# Patient Record
Sex: Female | Born: 1955 | Race: White | Hispanic: No | Smoking: Never smoker
Health system: Southern US, Community
[De-identification: ages and names within clinical notes are randomized; demographics above are authoritative.]

## PROBLEM LIST (undated history)

## (undated) DIAGNOSIS — Z0000 Encounter for general adult medical examination without abnormal findings: Secondary | ICD-10-CM

## (undated) DIAGNOSIS — Z91018 Allergy to other foods: Secondary | ICD-10-CM

## (undated) DIAGNOSIS — R011 Cardiac murmur, unspecified: Secondary | ICD-10-CM

## (undated) DIAGNOSIS — L309 Dermatitis, unspecified: Secondary | ICD-10-CM

## (undated) DIAGNOSIS — L900 Lichen sclerosus et atrophicus: Secondary | ICD-10-CM

## (undated) DIAGNOSIS — K219 Gastro-esophageal reflux disease without esophagitis: Secondary | ICD-10-CM

## (undated) DIAGNOSIS — T7840XA Allergy, unspecified, initial encounter: Secondary | ICD-10-CM

## (undated) DIAGNOSIS — I2510 Atherosclerotic heart disease of native coronary artery without angina pectoris: Secondary | ICD-10-CM

## (undated) DIAGNOSIS — M1990 Unspecified osteoarthritis, unspecified site: Secondary | ICD-10-CM

## (undated) DIAGNOSIS — H269 Unspecified cataract: Secondary | ICD-10-CM

## (undated) DIAGNOSIS — L509 Urticaria, unspecified: Secondary | ICD-10-CM

## (undated) DIAGNOSIS — J069 Acute upper respiratory infection, unspecified: Secondary | ICD-10-CM

## (undated) DIAGNOSIS — R5383 Other fatigue: Secondary | ICD-10-CM

## (undated) DIAGNOSIS — E785 Hyperlipidemia, unspecified: Secondary | ICD-10-CM

## (undated) HISTORY — DX: Lichen sclerosus et atrophicus: L90.0

## (undated) HISTORY — DX: Unspecified osteoarthritis, unspecified site: M19.90

## (undated) HISTORY — DX: Acute upper respiratory infection, unspecified: J06.9

## (undated) HISTORY — DX: Other fatigue: R53.83

## (undated) HISTORY — PX: PARTIAL HYSTERECTOMY: SHX80

## (undated) HISTORY — DX: Allergy, unspecified, initial encounter: T78.40XA

## (undated) HISTORY — DX: Unspecified cataract: H26.9

## (undated) HISTORY — PX: EYE SURGERY: SHX253

## (undated) HISTORY — DX: Gastro-esophageal reflux disease without esophagitis: K21.9

## (undated) HISTORY — DX: Atherosclerotic heart disease of native coronary artery without angina pectoris: I25.10

## (undated) HISTORY — DX: Encounter for general adult medical examination without abnormal findings: Z00.00

## (undated) HISTORY — PX: SPINE SURGERY: SHX786

## (undated) HISTORY — PX: BREAST SURGERY: SHX581

## (undated) HISTORY — DX: Dermatitis, unspecified: L30.9

## (undated) HISTORY — DX: Hyperlipidemia, unspecified: E78.5

## (undated) HISTORY — PX: ABDOMINAL HYSTERECTOMY: SHX81

## (undated) HISTORY — DX: Cardiac murmur, unspecified: R01.1

## (undated) HISTORY — PX: ANTERIOR CERVICAL DECOMP/DISCECTOMY FUSION: SHX1161

## (undated) HISTORY — DX: Urticaria, unspecified: L50.9

---

## 1983-06-15 HISTORY — PX: TUBAL LIGATION: SHX77

## 2008-10-09 ENCOUNTER — Encounter: Admission: RE | Admit: 2008-10-09 | Discharge: 2008-10-09 | Payer: Self-pay | Admitting: Obstetrics & Gynecology

## 2009-06-28 ENCOUNTER — Emergency Department (HOSPITAL_COMMUNITY)
Admission: EM | Admit: 2009-06-28 | Discharge: 2009-06-28 | Payer: Self-pay | Source: Home / Self Care | Admitting: Emergency Medicine

## 2010-07-14 ENCOUNTER — Other Ambulatory Visit: Payer: Self-pay | Admitting: Family Medicine

## 2010-07-14 ENCOUNTER — Ambulatory Visit
Admission: RE | Admit: 2010-07-14 | Discharge: 2010-07-14 | Payer: Self-pay | Source: Home / Self Care | Attending: Family Medicine | Admitting: Family Medicine

## 2010-07-14 ENCOUNTER — Other Ambulatory Visit (HOSPITAL_COMMUNITY)
Admission: RE | Admit: 2010-07-14 | Discharge: 2010-07-14 | Disposition: A | Discharge status: Home / Self Care | Payer: 59 | Source: Ambulatory Visit | Attending: Family Medicine | Admitting: Family Medicine

## 2010-07-14 DIAGNOSIS — R5383 Other fatigue: Secondary | ICD-10-CM | POA: Insufficient documentation

## 2010-07-14 DIAGNOSIS — Z124 Encounter for screening for malignant neoplasm of cervix: Secondary | ICD-10-CM | POA: Insufficient documentation

## 2010-07-14 DIAGNOSIS — R5381 Other malaise: Secondary | ICD-10-CM | POA: Insufficient documentation

## 2010-07-14 DIAGNOSIS — Z1239 Encounter for other screening for malignant neoplasm of breast: Secondary | ICD-10-CM

## 2010-07-14 DIAGNOSIS — Z1159 Encounter for screening for other viral diseases: Secondary | ICD-10-CM | POA: Insufficient documentation

## 2010-07-14 LAB — LDL CHOLESTEROL, DIRECT: Direct LDL: 204.3 mg/dL

## 2010-07-14 LAB — CBC WITH DIFFERENTIAL/PLATELET
Basophils Absolute: 0 10*3/uL (ref 0.0–0.1)
Eosinophils Absolute: 0.2 10*3/uL (ref 0.0–0.7)
HCT: 40.4 % (ref 36.0–46.0)
Hemoglobin: 13.8 g/dL (ref 12.0–15.0)
MCHC: 34.3 g/dL (ref 30.0–36.0)
MCV: 86.6 fl (ref 78.0–100.0)
Monocytes Absolute: 0.4 10*3/uL (ref 0.1–1.0)
Neutrophils Relative %: 59.9 % (ref 43.0–77.0)
Platelets: 255 10*3/uL (ref 150.0–400.0)
RBC: 4.66 Mil/uL (ref 3.87–5.11)

## 2010-07-14 LAB — LIPID PANEL
HDL: 75.4 mg/dL (ref >39.00)
Total CHOL/HDL Ratio: 4

## 2010-07-14 LAB — HM PAP SMEAR

## 2010-07-14 LAB — TSH: TSH: 0.66 u[IU]/mL (ref 0.35–5.50)

## 2010-07-17 ENCOUNTER — Encounter: Payer: Self-pay | Admitting: Family Medicine

## 2010-07-22 NOTE — Assessment & Plan Note (Signed)
Summary: NEW PT TO EST/CPX/CLE   Vital Signs:  Patient profile:   55 year old female Height:      63 inches Weight:      148.50 pounds BMI:     26.40 Temp:     97.8 degrees F oral Pulse rate:   72 / minute Pulse rhythm:   regular BP sitting:   120 / 80  (right arm) Cuff size:   regular  Vitals Entered By: Linde Gillis CMA Duncan Dull) (July 14, 2010 10:43 AM) CC: new patient, establish care   History of Present Illness: 55 yo here to establish care and for CPX.  menopausal x 2 years now. Had a partial hysterectomy in 80s, she is not sure if she has a cervix. Had hot flashes last year and vaginal dryness but symptoms are improving.  Had a URI several weeks ago, most symptoms resolved but still tired.  Has a little DOE, no CP. No LE edema. No hot or cold intolerance.  Preventive Screening-Counseling & Management  Alcohol-Tobacco     Smoking Status: never  Caffeine-Diet-Exercise     Does Patient Exercise: yes      Drug Use:  no.    Current Medications (verified): 1)  None  Allergies (verified): No Known Drug Allergies  Past History:  Family History: Last updated: 07/14/2010 Family History High cholesterol Family History Hypertension  Social History: Last updated: 07/14/2010 Divorced Never Smoked Alcohol use-no Drug use-no Regular exercise-yes  Risk Factors: Exercise: yes (07/14/2010)  Risk Factors: Smoking Status: never (07/14/2010)  Past Medical History: G3P2  Past Surgical History: Hysterectomy- per pt, partial Tubal ligation  Family History: Family History High cholesterol Family History Hypertension  Social History: Divorced Never Smoked Alcohol use-no Drug use-no Regular exercise-yes Smoking Status:  never Drug Use:  no Does Patient Exercise:  yes  Review of Systems      See HPI General:  Complains of fatigue; denies fever, loss of appetite, and sleep disorder. Eyes:  Denies blurring. ENT:  Denies difficulty swallowing. CV:   Denies chest pain or discomfort. Resp:  Denies shortness of breath. GI:  Denies abdominal pain, bloody stools, and change in bowel habits. GU:  Denies discharge and dysuria. MS:  Denies joint pain, joint redness, and joint swelling. Derm:  Denies rash. Neuro:  Denies headaches. Psych:  Denies anxiety and depression. Endo:  Denies cold intolerance and heat intolerance. Heme:  Denies abnormal bruising and bleeding.  Physical Exam  General:  alert, well-developed, well-nourished, and well-hydrated.   Head:  normocephalic and atraumatic.   Eyes:  vision grossly intact, pupils equal, pupils round, and pupils reactive to light.   Ears:  R ear normal and L ear normal.   Nose:  no external deformity.   Mouth:  good dentition.   Neck:  No deformities, masses, or tenderness noted. Breasts:  No mass, nodules, thickening, tenderness, bulging, retraction, inflamation, nipple discharge or skin changes noted.   Lungs:  Normal respiratory effort, chest expands symmetrically. Lungs are clear to auscultation, no crackles or wheezes. Heart:  Normal rate and regular rhythm. S1 and S2 normal without gallop, murmur, click, rub or other extra sounds. Abdomen:  Bowel sounds positive,abdomen soft and non-tender without masses, organomegaly or hernias noted. Rectal:  no external abnormalities.   Genitalia:  Pelvic Exam:        External: normal female genitalia without lesions or masses        Vagina: normal without lesions or masses  Cervix: absent        Adnexa: normal bimanual exam without masses or fullness        Uterus: absent        Pap smear: performed Msk:  No deformity or scoliosis noted of thoracic or lumbar spine.   Extremities:  no edema Neurologic:  alert & oriented X3 and gait normal.   Skin:  Intact without suspicious lesions or rashes Psych:  Cognition and judgment appear intact. Alert and cooperative with normal attention span and concentration. No apparent delusions, illusions,  hallucinations   Impression & Recommendations:  Problem # 1:  Preventive Health Care (ICD-V70.0) Reviewed preventive care protocols, scheduled due services, and updated immunizations Discussed nutrition, exercise, diet, and healthy lifestyle.  FLP, BMET today. referred for colonoscopy and mammogram today. Pelvic/pap performed- discussed that since she s/p hysterectomy, from this point forward will do pelvic exams but not pap smears.  Pt in agreement with plan.  Problem # 2:  FATIGUE (ICD-780.79) Assessment: New Likely due to URI from several weeks ago.  Lungs clear on exam. Will check some lab work to rule out other possible contributing factors. Orders: TLB-BMP (Basic Metabolic Panel-BMET) (80048-METABOL) TLB-CBC Platelet - w/Differential (85025-CBCD) TLB-TSH (Thyroid Stimulating Hormone) ([address]-UEA)  Other Orders: Gastroenterology Referral (GI) Radiology Referral (Radiology) TLB-Lipid Panel (80061-LIPID)  Patient Instructions: 1)  Great to meet you. 2)  Please stop by to see Shirlee Limerick on your way out.   Orders Added: 1)  Gastroenterology Referral [GI] 2)  Radiology Referral [Radiology] 3)  TLB-Lipid Panel [80061-LIPID] 4)  TLB-BMP (Basic Metabolic Panel-BMET) [80048-METABOL] 5)  TLB-CBC Platelet - w/Differential [85025-CBCD] 6)  TLB-TSH (Thyroid Stimulating Hormone) [84443-TSH] 7)  New Patient 40-64 years [99386] 8)  New Patient Level II [99202]    Prior Medications (reviewed today): None Current Allergies (reviewed today): No known allergies  Prevention & Chronic Care Immunizations   Influenza vaccine: Not documented    Tetanus booster: Not documented    Pneumococcal vaccine: Not documented  Colorectal Screening   Hemoccult: Not documented    Colonoscopy: Not documented   Colonoscopy action/deferral: GI referral  (07/14/2010)  Other Screening   Pap smear: Not documented   Pap smear action/deferral: Ordered  (07/14/2010)    Mammogram: Not documented    Mammogram action/deferral: Ordered  (07/14/2010)   Smoking status: never  (07/14/2010)  Lipids   Total Cholesterol: Not documented   Lipid panel action/deferral: Lipid Panel ordered   LDL: Not documented   LDL Direct: Not documented   HDL: Not documented   Triglycerides: Not documented   Nursing Instructions: GI referral for screening colonoscopy (see order) Pap smear today Schedule screening mammogram (see order)

## 2010-07-22 NOTE — Letter (Signed)
Summary: Results Follow up Letter  [place] [facility]  [address] [place], [place] [address]   Phone: [phone]  Fax: [phone]    07/17/2010 MRN: [id number]  Nancy [name] [address] [place], [place]  [address]  Dear [name],  The following are the results of your recent test(s):  Test         Result    Pap Smear:        Normal __X___  Not Normal _____ Comments: ______________________________________________________ Cholesterol: LDL(Bad cholesterol):         Your goal is less than:         HDL (Good cholesterol):       Your goal is more than: Comments:  ______________________________________________________ Mammogram:        Normal _____  Not Normal _____ Comments:  ___________________________________________________________________ Hemoccult:        Normal _____  Not normal _______ Comments:    _____________________________________________________________________ Other Tests:    We routinely do not discuss normal results over the telephone.  If you desire a copy of the results, or you have any questions about this information we can discuss them at your next office visit.   Sincerely,      Dr. Ruthe Mannan

## 2010-07-23 ENCOUNTER — Encounter (INDEPENDENT_AMBULATORY_CARE_PROVIDER_SITE_OTHER): Payer: Self-pay | Admitting: *Deleted

## 2010-07-24 ENCOUNTER — Encounter: Payer: Self-pay | Admitting: Gastroenterology

## 2010-07-28 ENCOUNTER — Ambulatory Visit
Admission: RE | Admit: 2010-07-28 | Discharge: 2010-07-28 | Disposition: A | Discharge status: Home / Self Care | Payer: 59 | Source: Ambulatory Visit | Attending: Family Medicine | Admitting: Family Medicine

## 2010-07-28 DIAGNOSIS — Z1239 Encounter for other screening for malignant neoplasm of breast: Secondary | ICD-10-CM

## 2010-07-30 NOTE — Miscellaneous (Signed)
Summary: previsit prep/rm  Clinical Lists Changes  Medications: Added new medication of MOVIPREP 100 GM  SOLR (PEG-KCL-NACL-NASULF-NA ASC-C) As per prep instructions. - Signed Rx of MOVIPREP 100 GM  SOLR (PEG-KCL-NACL-NASULF-NA ASC-C) As per prep instructions.;  #1 x 0;  Signed;  Entered by: Sherren Kerns RN;  Authorized by: Meryl Dare MD [facility];  Method used: [facility]  [address] [place], [place]  [address], Ph: [phone] or [phone], Fax: [phone] Observations: Added new observation of ALLERGY REV: Done (07/24/2010 10:24)    Prescriptions: MOVIPREP 100 GM  SOLR (PEG-KCL-NACL-NASULF-NA ASC-C) As per prep instructions.  #1 x 0   Entered by:   Sherren Kerns RN   Authorized by:   Meryl Dare MD [facility]   Signed by:   Sherren Kerns RN on 07/24/2010   Method used:   [facility]        [facility]  [address] (retail)       [address]       [place], [place]  [address]       Ph: [phone] or [phone]       Fax: [phone]   RxID:   [id number]

## 2010-07-30 NOTE — Letter (Signed)
Summary: [facility] Instructions  [facility]  [address] [place], [place] [address]   Phone: [phone]  Fax: [phone]       Nancy [name]    1956-04-22    MRN: [id number]        Procedure Day Dorna Bloom: Wednesday 08/19/10     Arrival Time: 8:00 am      Procedure Time: 9:00 am     Location of Procedure:                    _X_   [facility] (4th Floor)  PREPARATION FOR COLONOSCOPY WITH MOVIPREP   Starting 5 days prior to your procedure 08/14/10 do not eat nuts, seeds, popcorn, corn, beans, peas,  salads, or any raw vegetables.  Do not take any fiber supplements (e.g. Metamucil, Citrucel, and Benefiber).  THE DAY BEFORE YOUR PROCEDURE         DATE: 08/18/10  DAY: Tuesday    1.  Drink clear liquids the entire day-NO SOLID FOOD  2.  Do not drink anything colored red or purple.  Avoid juices with pulp.  No orange juice.  3.  Drink at least 64 oz. (8 glasses) of fluid/clear liquids during the day to prevent dehydration and help the prep work efficiently.  CLEAR LIQUIDS INCLUDE: Water Jello Ice Popsicles Tea (sugar ok, no milk/cream) Powdered fruit flavored drinks Coffee (sugar ok, no milk/cream) Gatorade Juice: apple, white grape, white cranberry  Lemonade Clear bullion, consomm, broth Carbonated beverages (any kind) Strained chicken noodle soup Hard Candy                             4.  In the morning, mix first dose of MoviPrep solution:    Empty 1 Pouch A and 1 Pouch B into the disposable container    Add lukewarm drinking water to the top line of the container. Mix to dissolve    Refrigerate (mixed solution should be used within 24 hrs)  5.  Begin drinking the prep at 5:00 p.m. The MoviPrep container is divided by 4 marks.   Every 15 minutes drink the solution down to the next mark (approximately 8 oz) until the full liter is complete.   6.  Follow completed prep with 16 oz of clear liquid of your choice (Nothing red or purple).   Continue to drink clear liquids until bedtime.  7.  Before going to bed, mix second dose of MoviPrep solution:    Empty 1 Pouch A and 1 Pouch B into the disposable container    Add lukewarm drinking water to the top line of the container. Mix to dissolve    Refrigerate  THE DAY OF YOUR PROCEDURE      DATE: 08/18/10   DAY: Tuesday   Beginning at 4:00 a.m. (5 hours before procedure):         1. Every 15 minutes, drink the solution down to the next mark (approx 8 oz) until the full liter is complete.  2. Follow completed prep with 16 oz. of clear liquid of your choice.    3. You may drink clear liquids until 7:00 am (2 HOURS BEFORE PROCEDURE).   MEDICATION INSTRUCTIONS  Unless otherwise instructed, you should take regular prescription medications with a small sip of water   as early as possible the morning of your procedure.         OTHER INSTRUCTIONS  You will need a  responsible adult at least 56 years of age to accompany you and drive you home.   This person must remain in the waiting room during your procedure.  Wear loose fitting clothing that is easily removed.  Leave jewelry and other valuables at home.  However, you may wish to bring a book to read or  an iPod/MP3 player to listen to music as you wait for your procedure to start.  Remove all body piercing jewelry and leave at home.  Total time from sign-in until discharge is approximately 2-3 hours.  You should go home directly after your procedure and rest.  You can resume normal activities the  day after your procedure.  The day of your procedure you should not:   Drive   Make legal decisions   Operate machinery   Drink alcohol   Return to work  You will receive specific instructions about eating, activities and medications before you leave.    The above instructions have been reviewed and explained to me by  Sherren Kerns RN  July 24, 2010 10:56 AM    I fully understand and can verbalize  these instructions _____________________________ Date _________

## 2010-07-31 ENCOUNTER — Encounter: Payer: Self-pay | Admitting: Family Medicine

## 2010-08-05 NOTE — Letter (Signed)
Summary: Results Follow up Letter  [place] [facility]  [address] [place], [place] [address]   Phone: [phone]  Fax: [phone]    07/31/2010 MRN: [id number]  Nancy [name] [address] [facility] [place], [place]  [address]  [place]  Dear Ms. Rickenbach,  The following are the results of your recent test(s):  Test         Result    Pap Smear:        Normal _____  Not Normal _____ Comments: ______________________________________________________ Cholesterol: LDL(Bad cholesterol):         Your goal is less than:         HDL (Good cholesterol):       Your goal is more than: Comments:  ______________________________________________________ Mammogram:        Normal __X___  Not Normal _____ Comments:  ___________________________________________________________________ Hemoccult:        Normal _____  Not normal _______ Comments:    _____________________________________________________________________ Other Tests:    We routinely do not discuss normal results over the telephone.  If you desire a copy of the results, or you have any questions about this information we can discuss them at your next office visit.   Sincerely,      Dr. Ruthe Mannan

## 2010-08-07 ENCOUNTER — Other Ambulatory Visit: Payer: Self-pay | Admitting: Gastroenterology

## 2010-08-19 ENCOUNTER — Other Ambulatory Visit (AMBULATORY_SURGERY_CENTER): Payer: 59 | Admitting: Gastroenterology

## 2010-08-19 ENCOUNTER — Other Ambulatory Visit: Payer: Self-pay | Admitting: Gastroenterology

## 2010-08-19 DIAGNOSIS — D126 Benign neoplasm of colon, unspecified: Secondary | ICD-10-CM

## 2010-08-19 DIAGNOSIS — D128 Benign neoplasm of rectum: Secondary | ICD-10-CM

## 2010-08-19 DIAGNOSIS — Z1211 Encounter for screening for malignant neoplasm of colon: Secondary | ICD-10-CM

## 2010-08-19 DIAGNOSIS — D129 Benign neoplasm of anus and anal canal: Secondary | ICD-10-CM

## 2010-08-19 DIAGNOSIS — K5730 Diverticulosis of large intestine without perforation or abscess without bleeding: Secondary | ICD-10-CM

## 2010-08-25 ENCOUNTER — Other Ambulatory Visit: Payer: Self-pay | Admitting: Family Medicine

## 2010-08-25 ENCOUNTER — Encounter (INDEPENDENT_AMBULATORY_CARE_PROVIDER_SITE_OTHER): Payer: Self-pay | Admitting: *Deleted

## 2010-08-25 ENCOUNTER — Other Ambulatory Visit (INDEPENDENT_AMBULATORY_CARE_PROVIDER_SITE_OTHER): Payer: 59

## 2010-08-25 DIAGNOSIS — E785 Hyperlipidemia, unspecified: Secondary | ICD-10-CM

## 2010-08-25 LAB — LIPID PANEL
Cholesterol: 194 mg/dL (ref 0–200)
Total CHOL/HDL Ratio: 3
Triglycerides: 69 mg/dL (ref 0.0–149.0)

## 2010-08-25 LAB — HEPATIC FUNCTION PANEL
ALT: 13 U/L (ref 0–35)
Alkaline Phosphatase: 78 U/L (ref 39–117)
Bilirubin, Direct: 0.1 mg/dL (ref 0.0–0.3)
Total Bilirubin: 0.8 mg/dL (ref 0.3–1.2)

## 2010-08-25 NOTE — Procedures (Addendum)
Summary: Colonoscopy  Patient: [name] Note: All result statuses are Final unless otherwise noted.  Tests: (1) Colonoscopy (COL)   COL Colonoscopy           DONE     [facility]     [address]     [place], [place]  [address]          COLONOSCOPY PROCEDURE REPORT          PATIENT:  Nancy [name], Nancy [name]  MR#:  [id number]     BIRTHDATE:  1956-06-13, 54 yrs. old  GENDER:  female     ENDOSCOPIST:  Judie Petit T. Russella Dar, MD, [facility]     Referred by:  Ruthe Mannan, M.D.     PROCEDURE DATE:  08/19/2010     PROCEDURE:  Colonoscopy with biopsy     ASA CLASS:  Class II     INDICATIONS:  1) Routine Risk Screening     MEDICATIONS:   Fentanyl 75 mcg IV, Versed 7 mg IV     DESCRIPTION OF PROCEDURE:   After the risks benefits and     alternatives of the procedure were thoroughly explained, informed     consent was obtained.  Digital rectal exam was performed and     revealed no abnormalities.  The LB [id number]-[id number] endoscope     was introduced through the anus and advanced to the cecum, which     was identified by both the appendix and ileocecal valve, without     limitations. The quality of the prep was good, using MoviPrep.     The instrument was then slowly withdrawn as the colon was fully     examined.     <<PROCEDUREIMAGES>>     FINDINGS:  Mild diverticulosis was found in the sigmoid colon.     Scattered diverticula were found in the transverse colon.  A     sessile polyp was found in the mid transverse colon. It was 3 mm     in size. The polyp was removed using cold biopsy forceps.     Otherwise normal colonoscopy without other polyps, masses, vascular     ectasias, or inflammatory changes.  Retroflexed views in the     rectum revealed no abnormalities.  The time to cecum =  2     minutes. The scope was then withdrawn (time =  10.33  min) from the     patient and the procedure completed.          COMPLICATIONS:  None          ENDOSCOPIC IMPRESSION:     1) Mild  diverticulosis in the sigmoid colon     2) Diverticula, scattered in the transverse colon     3) 3 mm sessile polyp in the mid transverse colon          RECOMMENDATIONS:     1) Await pathology results     2) High fiber diet with liberal fluid intake.     3) If the polyp is adenomatous (pre-cancerous), colonoscopy in 5     years. Otherwise follow colorectal cancer screening guidelines for     "routine risk" patients with colonoscopy in 10 years.          Venita Lick. Russella Dar, MD, [facility]          n.     eSIGNED:   Venita Lick. Miryah Ralls at 08/19/2010 09:13 AM          [place], [id number]  Note: An exclamation mark (!) indicates a result that was not dispersed into the flowsheet. Document Creation Date: 08/19/2010 9:14 AM _______________________________________________________________________  (1) Order result status: Final Collection or observation date-time: 08/19/2010 09:08 Requested date-time:  Receipt date-time:  Reported date-time:  Referring Physician:   Ordering Physician: Claudette Head [id number]) Specimen Source:  Source: Launa Grill Order Number: [id number] Lab site:   Appended Document: Colonoscopy     Procedures Next Due Date:    Colonoscopy: 08/2020

## 2010-08-30 ENCOUNTER — Encounter: Payer: Self-pay | Admitting: Gastroenterology

## 2010-08-30 LAB — COMPREHENSIVE METABOLIC PANEL
Albumin: 3.9 g/dL (ref 3.5–5.2)
Calcium: 9.2 mg/dL (ref 8.4–10.5)
Chloride: 104 mEq/L (ref 96–112)
GFR calc non Af Amer: >60 mL/min (ref >60)
Potassium: 3.4 mEq/L — ABNORMAL LOW (ref 3.5–5.1)
Sodium: 140 mEq/L (ref 135–145)
Total Protein: 6.5 g/dL (ref 6.0–8.3)

## 2010-08-30 LAB — CK TOTAL AND CKMB (NOT AT ARMC): Relative Index: INVALID (ref 0.0–2.5)

## 2010-08-30 LAB — CBC
MCHC: 33.7 g/dL (ref 30.0–36.0)
RBC: 4.68 MIL/uL (ref 3.87–5.11)
RDW: 13.2 % (ref 11.5–15.5)

## 2010-08-30 LAB — DIFFERENTIAL
Basophils Absolute: 0 10*3/uL (ref 0.0–0.1)
Eosinophils Relative: 3 % (ref 0–5)
Lymphocytes Relative: 34 % (ref 12–46)
Monocytes Relative: 7 % (ref 3–12)
Neutro Abs: 3.8 10*3/uL (ref 1.7–7.7)
Neutrophils Relative %: 55 % (ref 43–77)

## 2010-09-10 NOTE — Letter (Signed)
Summary: Patient Notice- Colon Biospy Results  [facility]  [address] [place], [place] [address]   Phone: [phone]  Fax: [phone]        August 30, 2010 MRN: [id number]    Nancy [name] [address] [facility] [place], [place]  [address]    Dear Ms. Kavanaugh,  I am pleased to inform you that the biopsies taken during your recent colonoscopy did not show any evidence of cancer upon pathologic examination. The biopsy showed normal colonic mucosa. There was no polyp.  Continue with the treatment plan as outlined on the day of your      exam.  You should have a repeat colonoscopy examination for routine colorectal cancer screening in 10 years.  Please call us if you are having persistent problems or have questions about your condition that have not been fully answered at this time.  Sincerely,  Meryl Dare MD [facility]  This letter has been electronically signed by your physician.  Appended Document: Patient Notice- Colon Biospy Results letter mailed

## 2010-09-30 ENCOUNTER — Telehealth: Payer: Self-pay | Admitting: *Deleted

## 2010-09-30 NOTE — Telephone Encounter (Signed)
Pt called this morning, states she had symptoms of a sinus infection and wanted to be seen.  I advised her there were no appts available today, offered one for tomorrow, but she said she would go to an urgent care, as she wanted to go back to work tomorrow.  Just an FYI.

## 2010-10-02 ENCOUNTER — Other Ambulatory Visit: Payer: Self-pay | Admitting: Family Medicine

## 2010-10-07 ENCOUNTER — Other Ambulatory Visit: Payer: Self-pay | Admitting: *Deleted

## 2010-10-07 MED ORDER — SIMVASTATIN 20 MG PO TABS: 20.0000 mg | ORAL_TABLET | Freq: Every evening | ORAL | Status: DC

## 2011-10-23 ENCOUNTER — Other Ambulatory Visit: Payer: Self-pay | Admitting: Family Medicine

## 2011-11-02 ENCOUNTER — Ambulatory Visit (INDEPENDENT_AMBULATORY_CARE_PROVIDER_SITE_OTHER): Payer: 59 | Admitting: Family Medicine

## 2011-11-02 ENCOUNTER — Encounter: Payer: Self-pay | Admitting: Family Medicine

## 2011-11-02 VITALS — BP 110/80 | HR 68 | Temp 97.9°F | Wt 146.0 lb

## 2011-11-02 DIAGNOSIS — E785 Hyperlipidemia, unspecified: Secondary | ICD-10-CM

## 2011-11-02 LAB — COMPREHENSIVE METABOLIC PANEL
ALT: 14 U/L (ref 0–35)
Alkaline Phosphatase: 76 U/L (ref 39–117)
BUN: 11 mg/dL (ref 6–23)
Calcium: 9.2 mg/dL (ref 8.4–10.5)
Creatinine, Ser: 0.7 mg/dL (ref 0.4–1.2)
Glucose, Bld: 93 mg/dL (ref 70–99)
Potassium: 5.2 mEq/L — ABNORMAL HIGH (ref 3.5–5.1)

## 2011-11-02 LAB — LDL CHOLESTEROL, DIRECT: Direct LDL: 197.5 mg/dL

## 2011-11-02 LAB — LIPID PANEL
Total CHOL/HDL Ratio: 5
VLDL: 16.6 mg/dL (ref 0.0–40.0)

## 2011-11-02 NOTE — Progress Notes (Signed)
  Subjective:    Patient ID: Nancy [name], female    DOB: 03/17/1956, 56 y.o.   MRN: [id number]  HPI  56 yo here for follow up cholesterol. cholesterol.  Lab Results  Component Value Date   CHOL 194 08/25/2010   HDL 66.20 08/25/2010   LDLCALC 114* 08/25/2010   LDLDIRECT 204.3 07/14/2010   TRIG 69.0 08/25/2010   CHOLHDL 3 08/25/2010   Took simvastatin 20 mg for over a year.  Noticed it was causing upset stomach- gasy, constipation.  Stopped taking it and those symptoms went away. Has been working on diet- cutting back on saturated fats, sweets, fried foods.  Patient Active Problem List  Diagnoses  . FATIGUE  . HYPERLIPIDEMIA   Past Medical History  Diagnosis Date  . Fatigue   . Hyperlipidemia   . Routine general medical examination at a health care facility    Past Surgical History  Procedure Date  . Partial hysterectomy   . Tubal ligation    History  Substance Use Topics  . Smoking status: Never Smoker   . Smokeless tobacco: Not on file  . Alcohol Use: No   No family history on file. No Known Allergies No current outpatient prescriptions on file prior to visit.   The PMH, PSH, Social History, Family History, Medications, and allergies have been reviewed in [facility], and have been updated if relevant.   Review of Systems See HPI    Objective:   Physical Exam BP 110/80  Pulse 68  Temp(Src) 97.9 F (36.6 C) (Oral)  Wt 146 lb (66.225 kg)  General:  Well-developed,well-nourished,in no acute distress; alert,appropriate and cooperative throughout examination Head:  normocephalic and atraumatic.   Eyes:  vision grossly intact, pupils equal, pupils round, and pupils reactive to light.   Ears:  R ear normal and L ear normal.   Nose:  no external deformity.   Mouth:  good dentition.   Lungs:  Normal respiratory effort, chest expands symmetrically. Lungs are clear to auscultation, no crackles or wheezes. Heart:  Normal rate and regular rhythm. S1 and S2 normal without gallop, murmur,  click, rub or other extra sounds. Abdomen:  Bowel sounds positive,abdomen soft and non-tender without masses, organomegaly or hernias noted. Neurologic:  alert & oriented X3 and gait normal.   Skin:  Intact without suspicious lesions or rashes Psych:  Cognition and judgment appear intact. Alert and cooperative with normal attention span and concentration. No apparent delusions, illusions, hallucinations       Assessment & Plan:   1. HYPERLIPIDEMIA  Comprehensive metabolic panel, Lipid Panel   Recheck lipid panel today.  LDL was VERY elevated prior to starting statin so will likely need a statin- try pravachol or crestor if remains elevated. The patient indicates understanding of these issues and agrees with the plan.

## 2011-11-02 NOTE — Patient Instructions (Signed)
Great to see you. We will call you with your lab work results.

## 2011-11-03 ENCOUNTER — Other Ambulatory Visit: Payer: Self-pay | Admitting: *Deleted

## 2011-11-03 MED ORDER — ATORVASTATIN CALCIUM 20 MG PO TABS: 20.0000 mg | ORAL_TABLET | Freq: Every day | ORAL | Status: DC

## 2011-12-20 ENCOUNTER — Encounter: Payer: Self-pay | Admitting: Family Medicine

## 2011-12-20 ENCOUNTER — Ambulatory Visit (INDEPENDENT_AMBULATORY_CARE_PROVIDER_SITE_OTHER): Payer: 59 | Admitting: Family Medicine

## 2011-12-20 VITALS — BP 140/82 | HR 68 | Temp 97.8°F | Wt 146.0 lb

## 2011-12-20 DIAGNOSIS — R1011 Right upper quadrant pain: Secondary | ICD-10-CM

## 2011-12-20 LAB — COMPREHENSIVE METABOLIC PANEL
Albumin: 4.1 g/dL (ref 3.5–5.2)
CO2: 29 mEq/L (ref 19–32)
Calcium: 9.2 mg/dL (ref 8.4–10.5)
Chloride: 104 mEq/L (ref 96–112)
Creatinine, Ser: 0.7 mg/dL (ref 0.4–1.2)
GFR: 91.66 mL/min (ref >60.00)
Glucose, Bld: 88 mg/dL (ref 70–99)
Potassium: 4.3 mEq/L (ref 3.5–5.1)
Total Bilirubin: 0.9 mg/dL (ref 0.3–1.2)

## 2011-12-20 LAB — LIPASE: Lipase: 38 U/L (ref 11.0–59.0)

## 2011-12-20 MED ORDER — ATORVASTATIN CALCIUM 20 MG PO TABS: 20.0000 mg | ORAL_TABLET | Freq: Every day | ORAL | Status: DC

## 2011-12-20 NOTE — Patient Instructions (Addendum)
Good to see you. Please stop by to see Nancy [name] on your way out to set up your ultrasound. As soon as I have your results, I will call you.

## 2011-12-20 NOTE — Progress Notes (Signed)
  Subjective:    Patient ID: Nancy [name], female    DOB: Feb 24, 1956, 56 y.o.   MRN: [id number]  HPI  56 yo here for right upper quadrant abdominal pain.  Pain comes and goes- she thinks it is worsened by fatty foods but not sure.  At times, so severe, "it takes me to my knees." Has had some associated nausea, no vomiting. No fevers.  No diarrhea.  Pain seems to be getting progressively worse.  Patient Active Problem List  Diagnosis  . FATIGUE  . HYPERLIPIDEMIA  . Right upper quadrant pain   Past Medical History  Diagnosis Date  . Fatigue   . Hyperlipidemia   . Routine general medical examination at a health care facility    Past Surgical History  Procedure Date  . Partial hysterectomy   . Tubal ligation    History  Substance Use Topics  . Smoking status: Never Smoker   . Smokeless tobacco: Not on file  . Alcohol Use: No   No family history on file. No Known Allergies Current Outpatient Prescriptions on File Prior to Visit  Medication Sig Dispense Refill  . DISCONTD: atorvastatin (LIPITOR) 20 MG tablet Take 1 tablet (20 mg total) by mouth daily.  90 tablet  0   The PMH, PSH, Social History, Family History, Medications, and allergies have been reviewed in [facility], and have been updated if relevant.   Review of Systems See HPI No blood in stool- has had some "lighter stools"   Objective:   Physical Exam BP 140/82  Pulse 68  Temp 97.8 F (36.6 C)  Wt 146 lb (66.225 kg)  General:  Well-developed,well-nourished,in no acute distress; alert,appropriate and cooperative throughout examination Head:  normocephalic and atraumatic.   Nose:  no external deformity.   Mouth:  good dentition.   Lungs:  Normal respiratory effort, chest expands symmetrically. Lungs are clear to auscultation, no crackles or wheezes. Heart:  Normal rate and regular rhythm. S1 and S2 normal without gallop, murmur, click, rub or other extra sounds. Abdomen:  Bowel sounds positive,abdomen soft  and non-tender without masses, organomegaly or hernias noted. Murphy's sign neg Msk:  No deformity or scoliosis noted of thoracic or lumbar spine.   Extremities:  No clubbing, cyanosis, edema, or deformity noted with normal full range of motion of all joints.   Neurologic:  alert & oriented X3 and gait normal.   Skin:  Intact without suspicious lesions or rashes Psych:  Cognition and judgment appear intact. Alert and cooperative with normal attention span and concentration. No apparent delusions, illusions, hallucinations        Assessment & Plan:   1. Right upper quadrant pain  US Abdomen Complete, Comprehensive metabolic panel, Lipase  New- does seem consistent with biliary colic. Abdominal exam did not show signs of acute abdomen- reasonable to work up as out patient. Will check Ultrasound of abdomen and labs today. The patient indicates understanding of these issues and agrees with the plan.

## 2011-12-23 ENCOUNTER — Ambulatory Visit
Admission: RE | Admit: 2011-12-23 | Discharge: 2011-12-23 | Disposition: A | Discharge status: Home / Self Care | Payer: 59 | Source: Ambulatory Visit | Attending: Family Medicine | Admitting: Family Medicine

## 2011-12-23 DIAGNOSIS — R1011 Right upper quadrant pain: Secondary | ICD-10-CM

## 2012-01-28 ENCOUNTER — Other Ambulatory Visit: Payer: Self-pay | Admitting: Family Medicine

## 2012-01-28 DIAGNOSIS — E785 Hyperlipidemia, unspecified: Secondary | ICD-10-CM

## 2012-02-03 ENCOUNTER — Other Ambulatory Visit (INDEPENDENT_AMBULATORY_CARE_PROVIDER_SITE_OTHER): Payer: 59

## 2012-02-03 DIAGNOSIS — E785 Hyperlipidemia, unspecified: Secondary | ICD-10-CM

## 2012-02-03 LAB — COMPREHENSIVE METABOLIC PANEL
ALT: 22 U/L (ref 0–35)
Alkaline Phosphatase: 82 U/L (ref 39–117)
BUN: 13 mg/dL (ref 6–23)
Chloride: 102 mEq/L (ref 96–112)
Glucose, Bld: 89 mg/dL (ref 70–99)
Sodium: 139 mEq/L (ref 135–145)
Total Protein: 7.4 g/dL (ref 6.0–8.3)

## 2012-02-03 LAB — LIPID PANEL
Total CHOL/HDL Ratio: 3
VLDL: 21 mg/dL (ref 0.0–40.0)

## 2012-02-07 ENCOUNTER — Encounter: Payer: Self-pay | Admitting: *Deleted

## 2012-02-09 ENCOUNTER — Telehealth: Payer: Self-pay

## 2012-02-09 NOTE — Telephone Encounter (Signed)
Pt request lab results of recent labs. Explained letter had been mailed with results. Gave Dr Elmer Sow instruction over phone. Pt understood.

## 2012-06-22 ENCOUNTER — Telehealth: Payer: Self-pay | Admitting: Family Medicine

## 2012-06-22 NOTE — Telephone Encounter (Signed)
Patient Information:  Caller Name: [name]  Phone: [phone]  Patient: Nancy [name], Nancy [name]  Gender: Female  DOB: 09-13-1955  Age: 56 Years  PCP: [facility]  Office Follow Up:  Does the office need to follow up with this patient?: Yes  Instructions For The Office: Request RX.  Concerned about flu exposure in office.  RN Note:  She declines appt and does not want to be exposed to the flu. Advised normally no antibiotics will be called in without evaluation. She is very concerned about flu exposure.  Requesting antibiotic.  Pharmacy- [facility] [address]  Symptoms  Reason For Call & Symptoms: Patient requesting antibiotic for Sinus Infection /Congestion.  She states she has been having symptoms of headache , face, postnasal drip, bad taste in her mouth  and teeth hurting since [place] . Occasional chills. Unsure of fever.  Worsening the last week.  She has Chronic Sinus issues per patient.  Reviewed Health History In EMR: Yes  Reviewed Medications In EMR: Yes  Reviewed Allergies In EMR: Yes  Reviewed Surgeries / Procedures: No  Date of Onset of Symptoms: 06/07/2012  Treatments Tried: Treated at home- claritin, saliene rinse , Afrin once a day  Treatments Tried Worked: No  Guideline(s) Used:  Sinus Pain and Congestion  Disposition Per Guideline:   See Today or Tomorrow in Office  Reason For Disposition Reached:   Sinus congestion (pressure, fullness) present > 10 days  Advice Given:  Reassurance:   Sinus congestion is a normal part of a cold.  Here is some care advice that should help.  For a Stuffy Nose - Use Nasal Washes:  Introduction: Saline (salt water) nasal irrigation (nasal wash) is an effective and simple home remedy for treating stuffy nose and sinus congestion. The nose can be irrigated by pouring, spraying, or squirting salt water into the nose and then letting it run back out.  How it Helps: The salt water rinses out excess mucus, washes out any  irritants (dust, allergens) that might be present, and moistens the nasal cavity.  Methods: There are several ways to perform nasal irrigation. You can use a saline nasal spray bottle (available over-the-counter), a rubber ear syringe, a medical syringe without the needle, or a Neti Pot.  Hydration:  Drink plenty of liquids (6-8 glasses of water daily). If the air in your home is dry, use a cool mist humidifier  Call Back If:   Severe pain lasts longer than 2 hours after pain medicine  Sinus pain lasts longer than 1 day after starting treatment using nasal washes  Sinus congestion (fullness) lasts longer than 10 days  Fever lasts longer than 3 days  You become worse.  Pain and Fever Medicines:  For pain or fever relief, take either acetaminophen or ibuprofen.  They are over-the-counter (OTC) drugs that help treat both fever and pain. You can buy them at the drugstore.  Acetaminophen (e.g., Tylenol):  Regular Strength Tylenol: Take 650 mg (two 325 mg pills) by mouth every 4-6 hours as needed. Each Regular Strength Tylenol pill has 325 mg of acetaminophen.  Ibuprofen (e.g., Motrin, Advil):  Take 400 mg (two 200 mg pills) by mouth every 6 hours.  Patient Refused Recommendation:  Patient Requests Prescription  request medication

## 2012-06-22 NOTE — Telephone Encounter (Signed)
Unfortunately, needs to be seen for abx to be called in.  Often these infections are viruses and abx don't help.  Continue supportive care as discussed with call a nurse.

## 2012-06-23 NOTE — Telephone Encounter (Signed)
Advised patient as instructed. 

## 2012-06-23 NOTE — Telephone Encounter (Signed)
Left message asking patient to call back

## 2012-07-25 ENCOUNTER — Emergency Department (HOSPITAL_COMMUNITY)
Admission: EM | Admit: 2012-07-25 | Discharge: 2012-07-26 | Disposition: A | Discharge status: Home / Self Care | Payer: 59 | Attending: Emergency Medicine | Admitting: Emergency Medicine

## 2012-07-25 ENCOUNTER — Encounter (HOSPITAL_COMMUNITY): Payer: Self-pay | Admitting: *Deleted

## 2012-07-25 DIAGNOSIS — Z8719 Personal history of other diseases of the digestive system: Secondary | ICD-10-CM | POA: Insufficient documentation

## 2012-07-25 DIAGNOSIS — E785 Hyperlipidemia, unspecified: Secondary | ICD-10-CM | POA: Insufficient documentation

## 2012-07-25 DIAGNOSIS — Z79899 Other long term (current) drug therapy: Secondary | ICD-10-CM | POA: Insufficient documentation

## 2012-07-25 DIAGNOSIS — R61 Generalized hyperhidrosis: Secondary | ICD-10-CM | POA: Insufficient documentation

## 2012-07-25 DIAGNOSIS — R1013 Epigastric pain: Secondary | ICD-10-CM | POA: Insufficient documentation

## 2012-07-25 DIAGNOSIS — R0789 Other chest pain: Secondary | ICD-10-CM

## 2012-07-25 LAB — CBC WITH DIFFERENTIAL/PLATELET
Basophils Absolute: 0 10*3/uL (ref 0.0–0.1)
Basophils Relative: 1 % (ref 0–1)
Eosinophils Absolute: 0.2 10*3/uL (ref 0.0–0.7)
Eosinophils Relative: 3 % (ref 0–5)
HCT: 42.6 % (ref 36.0–46.0)
MCHC: 33.6 g/dL (ref 30.0–36.0)
MCV: 86.6 fL (ref 78.0–100.0)
Monocytes Absolute: 0.6 10*3/uL (ref 0.1–1.0)
RDW: 12.6 % (ref 11.5–15.5)

## 2012-07-25 MED ORDER — GI COCKTAIL ~~LOC~~
30.0000 mL | Freq: Once | ORAL | Status: AC
  Administered 2012-07-25: 30 mL via ORAL
  Filled 2012-07-25: qty 30

## 2012-07-25 NOTE — ED Notes (Signed)
Pt states started with epigastric burning around 2130; felt lightheaded; felt like heart beating fast; since then feels like burning in chest radiating to neck and left arm; states burning is constant but moving; denies n/v; denies dizziness

## 2012-07-26 ENCOUNTER — Emergency Department (HOSPITAL_COMMUNITY): Payer: 59

## 2012-07-26 LAB — TROPONIN I: Troponin I: <0.3 ng/mL (ref <0.30)

## 2012-07-26 LAB — COMPREHENSIVE METABOLIC PANEL
AST: 24 U/L (ref 0–37)
Albumin: 4.2 g/dL (ref 3.5–5.2)
Calcium: 9.6 mg/dL (ref 8.4–10.5)
Creatinine, Ser: 0.65 mg/dL (ref 0.50–1.10)
GFR calc non Af Amer: >90 mL/min (ref >90)

## 2012-07-26 LAB — POCT I-STAT TROPONIN I

## 2012-07-26 NOTE — Discharge Instructions (Signed)
Read instructions below for reasons to return to the Emergency Department. It is recommended that your follow up with your Primary Care Doctor & gastroenterologist in regards to today's visit. If you do not have a doctor, use the resource guide listed below to help you find one. Begin taking over the counter Prilosec or Zegrid as directed.   Chest Pain (Nonspecific)  HOME CARE INSTRUCTIONS  For the next few days, avoid physical activities that bring on chest pain. Continue physical activities as directed.  Do not smoke cigarettes or drink alcohol until your symptoms are gone.  Only take over-the-counter or prescription medicine for pain, discomfort, or fever as directed by your caregiver.  Follow your caregiver's suggestions for further testing if your chest pain does not go away.  Keep any follow-up appointments you made. If you do not go to an appointment, you could develop lasting (chronic) problems with pain. If there is any problem keeping an appointment, you must call to reschedule.  SEEK MEDICAL CARE IF:  You think you are having problems from the medicine you are taking. Read your medicine instructions carefully.  Your chest pain does not go away, even after treatment.  You develop a rash with blisters on your chest.  SEEK IMMEDIATE MEDICAL CARE IF:  You have increased chest pain or pain that spreads to your arm, neck, jaw, back, or belly (abdomen).  You develop shortness of breath, an increasing cough, or you are coughing up blood.  You have severe back or abdominal pain, feel sick to your stomach (nauseous) or throw up (vomit).  You develop severe weakness, fainting, or chills.  You have an oral temperature above 102 F (38.9 C), not controlled by medicine.   THIS IS AN EMERGENCY. Do not wait to see if the pain will go away. Get medical help at once. Call your local emergency services (911 in [place] Do not drive yourself to the hospital.   RESOURCE GUIDE  Dental  Problems  Patients with Medicaid: [facility] [phone] [address]                                           [phone] Phone:  [phone]                                                  Phone:  [phone]  If unable to pay or uninsured, contact:  [facility] or [facility] to become qualified for the adult dental clinic.  Chronic Pain Problems Contact [facility]  [phone] Patients need to be referred by their primary care doctor.  Insufficient Money for Medicine Contact [employer] Way:  call "211" or [facility] [phone].  No Primary Care Doctor Call [facility]  [phone] Other agencies that provide inexpensive medical care    [facility]  [phone]    [facility]  [phone]    [facility]  [phone]    [facility]  [phone]    Planned Parenthood  [phone]    [facility]  [phone]  Psychological Services [facility] Behavioral Health  [phone] [facility]  [phone] [facility] Mental Health   [phone] (emergency services [phone])  Substance Abuse Resources [facility]  [phone] [facility] [phone] [facility] [phone] Floydene Flock [phone] Residential & Outpatient Substance Abuse Program  [phone]  Abuse/Neglect [facility] Child Abuse Hotline 802-317-9505 [facility] Child Abuse Hotline [phone] (After Hours)  [facility] [phone]  [facility] [phone] [facility] [phone]  MRSA Hotline #:   (530) 257-3295    [facility] Resources  [facility]     [employer]                          [facility] [address] [place]                       [address]      [address]   Blondell Reveal Phone:  [phone]                                   Phone:  [phone]                 Phone:  [phone]  [facility] Mental Health Phone:  [phone]  [facility] Child Abuse Hotline [phone])374-0484 [phone] (After Hours)

## 2012-07-26 NOTE — ED Provider Notes (Signed)
History     CSN: [id number]  Arrival date & time 07/25/12  2305   First MD Initiated Contact with Patient 07/25/12 2328      Chief Complaint  Patient presents with  . Chest Pain    (Consider location/radiation/quality/duration/timing/severity/associated sxs/prior treatment) HPI Comments: Nancy [name] is a 57 y.o. female with a history of acid reflux presents emergency department complaining of a burning chest pain.  Onset of symptoms began around 2130 and located primarily in the epigastric region.  Patient reports that she feels like the pain is radiating to different areas of her chest and across her abdomen into her neck.  In addition patient reports an associated 22nd long palpitation episode occurred earlier this evening where she felt lightheaded, but since this has resolved without return.  Patient describes the pain as a burning sensation.  She reports increased burping recently that she's been treating with tums with only minimal relief.  Patient denies any nausea, emesis, diaphoresis, abdominal pain, hematochezia, hematemesis, fever, night sweats, chills, shortness of breath, dyspnea on exertion, orthopnea, PND, leg swelling, pleurisy, and syncope.    The history is provided by the patient.    Past Medical History  Diagnosis Date  . Fatigue   . Hyperlipidemia   . Routine general medical examination at a health care facility     Past Surgical History  Procedure Laterality Date  . Partial hysterectomy    . Tubal ligation      No family history on file.  History  Substance Use Topics  . Smoking status: Never Smoker   . Smokeless tobacco: Not on file  . Alcohol Use: No    OB History   Grav Para Term Preterm Abortions TAB SAB Ect Mult Living                  Review of Systems  All other systems reviewed and are negative.    Allergies  Review of patient's allergies indicates no known allergies.  Home Medications   Current Outpatient Rx  Name  Route   Sig  Dispense  Refill  . atorvastatin (LIPITOR) 20 MG tablet   Oral   Take 1 tablet (20 mg total) by mouth daily.   90 tablet   3   . calcium carbonate (TUMS - DOSED IN MG ELEMENTAL CALCIUM) 500 MG chewable tablet   Oral   Chew 1 tablet by mouth 2 (two) times daily.           BP 173/91  Pulse 85  Temp(Src) 97.9 F (36.6 C)  Resp 20  SpO2 100%  Physical Exam  Nursing note and vitals reviewed. Constitutional: She appears well-developed and well-nourished. No distress.  HENT:  Head: Normocephalic and atraumatic.  Eyes: Conjunctivae and EOM are normal. Pupils are equal, round, and reactive to light.  Neck: Normal range of motion. Neck supple. Normal carotid pulses and no JVD present. Carotid bruit is not present. No rigidity. Normal range of motion present.  Cardiovascular: Normal rate, regular rhythm, S1 normal, S2 normal, normal heart sounds, intact distal pulses and normal pulses.  Exam reveals no gallop and no friction rub.   No murmur heard. No pitting edema bilaterally, RRR, no aberrant sounds on auscultations, distal pulses intact, no carotid bruit or JVD.   Pulmonary/Chest: Effort normal and breath sounds normal. No accessory muscle usage or stridor. No respiratory distress. She exhibits no tenderness and no bony tenderness.  Abdominal: Bowel sounds are normal.  Abdomen soft. Mild epigastric tnederness.  Non pulsatile aorta.   Skin: Skin is warm, dry and intact. No rash noted. She is not diaphoretic. No cyanosis. Nails show no clubbing.    ED Course  Procedures (including critical care time)  Labs Reviewed  COMPREHENSIVE METABOLIC PANEL - Abnormal; Notable for the following:    Glucose, Bld 101 (*)    All other components within normal limits  CBC WITH DIFFERENTIAL  POCT I-STAT TROPONIN I   Dg Chest 2 View  07/26/2012  *RADIOLOGY REPORT*  Clinical Data: Burning sensation in the chest.  CHEST - 2 VIEW  Comparison: 06/28/2009  Findings: Cardiac and mediastinal  contours appear normal.  The lungs appear clear.  No pleural effusion is identified.  IMPRESSION:  No significant abnormality identified.   Original Report Authenticated By: Gaylyn Rong, M.D.      No diagnosis found.   Date: 07/26/2012  Rate: 83  Rhythm: normal sinus rhythm  QRS Axis: normal  Intervals: normal  ST/T Wave abnormalities: ST depressions inferiorly, minimal   Conduction Disutrbances:none  Narrative Interpretation:   Old EKG Reviewed: changes noted, shown to attending and not concerning for acute MI     MDM  Patient is to be discharged with recommendation to follow up with PCP in regards to today's hospital visit. Chest pain is not likely of cardiac or pulmonary etiology d/t presentation, perc negative, VSS, no tracheal deviation, no JVD or new murmur, RRR, breath sounds equal bilaterally, EKG without acute abnormalities, negative troponin, and negative CXR. Pt has been advised start a PPI and return to the ED is CP becomes exertional, associated with diaphoresis or nausea, radiates to left jaw/arm, worsens or becomes concerning in any way. Pt appears reliable for follow up and is agreeable to discharge.   Case has been discussed with and seen by Dr. Norlene Campbell who agrees with the above plan to discharge.          Jaci Carrel, [place] 07/26/12 [phone]

## 2012-07-28 NOTE — ED Provider Notes (Signed)
Medical screening examination/treatment/procedure(s) were performed by non-physician practitioner and as supervising physician I was immediately available for consultation/collaboration.   Lyanne Co, MD 07/28/12 [phone]

## 2012-12-27 ENCOUNTER — Other Ambulatory Visit: Payer: Self-pay | Admitting: Family Medicine

## 2014-01-14 ENCOUNTER — Encounter: Payer: Self-pay | Admitting: Family Medicine

## 2014-01-14 ENCOUNTER — Ambulatory Visit (INDEPENDENT_AMBULATORY_CARE_PROVIDER_SITE_OTHER): Payer: 59 | Admitting: Family Medicine

## 2014-01-14 VITALS — BP 118/72 | HR 74 | Temp 98.1°F | Ht 62.75 in | Wt 145.5 lb

## 2014-01-14 DIAGNOSIS — E785 Hyperlipidemia, unspecified: Secondary | ICD-10-CM

## 2014-01-14 DIAGNOSIS — Z0000 Encounter for general adult medical examination without abnormal findings: Secondary | ICD-10-CM

## 2014-01-14 LAB — LIPID PANEL
CHOL/HDL RATIO: 4
CHOLESTEROL: 268 mg/dL — AB (ref 0–200)
HDL: 66.9 mg/dL (ref >39.00)
LDL CALC: 177 mg/dL — AB (ref 0–99)
NonHDL: 201.1
TRIGLYCERIDES: 121 mg/dL (ref 0.0–149.0)
VLDL: 24.2 mg/dL (ref 0.0–40.0)

## 2014-01-14 LAB — CBC WITH DIFFERENTIAL/PLATELET
BASOS PCT: 0.5 % (ref 0.0–3.0)
Basophils Absolute: 0 10*3/uL (ref 0.0–0.1)
EOS ABS: 0.2 10*3/uL (ref 0.0–0.7)
EOS PCT: 2.6 % (ref 0.0–5.0)
HEMATOCRIT: 39 % (ref 36.0–46.0)
Hemoglobin: 13.1 g/dL (ref 12.0–15.0)
LYMPHS ABS: 2.6 10*3/uL (ref 0.7–4.0)
Lymphocytes Relative: 34 % (ref 12.0–46.0)
MCHC: 33.6 g/dL (ref 30.0–36.0)
MCV: 86.2 fl (ref 78.0–100.0)
MONO ABS: 0.4 10*3/uL (ref 0.1–1.0)
Monocytes Relative: 5 % (ref 3.0–12.0)
NEUTROS PCT: 57.9 % (ref 43.0–77.0)
Neutro Abs: 4.5 10*3/uL (ref 1.4–7.7)
PLATELETS: 266 10*3/uL (ref 150.0–400.0)
RBC: 4.52 Mil/uL (ref 3.87–5.11)
RDW: 13.3 % (ref 11.5–15.5)
WBC: 7.7 10*3/uL (ref 4.0–10.5)

## 2014-01-14 LAB — COMPREHENSIVE METABOLIC PANEL
ALK PHOS: 80 U/L (ref 39–117)
ALT: 17 U/L (ref 0–35)
AST: 21 U/L (ref 0–37)
Albumin: 4.1 g/dL (ref 3.5–5.2)
BILIRUBIN TOTAL: 0.7 mg/dL (ref 0.2–1.2)
BUN: 14 mg/dL (ref 6–23)
CO2: 27 mEq/L (ref 19–32)
Calcium: 8.9 mg/dL (ref 8.4–10.5)
Chloride: 99 mEq/L (ref 96–112)
Creatinine, Ser: 0.7 mg/dL (ref 0.4–1.2)
GFR: 97.39 mL/min (ref >60.00)
GLUCOSE: 85 mg/dL (ref 70–99)
Potassium: 4.2 mEq/L (ref 3.5–5.1)
SODIUM: 134 meq/L — AB (ref 135–145)
Total Protein: 7.2 g/dL (ref 6.0–8.3)

## 2014-01-14 LAB — TSH: TSH: 0.69 u[IU]/mL (ref 0.35–4.50)

## 2014-01-14 NOTE — Assessment & Plan Note (Addendum)
Check labs today. Continue working on diet and exercise.  ? pravachol. Orders Placed This Encounter  Procedures  . CBC with Differential  . Comprehensive metabolic panel  . Lipid panel  . TSH

## 2014-01-14 NOTE — Progress Notes (Signed)
Pre visit review using our clinic review tool, if applicable. No additional management support is needed unless otherwise documented below in the visit note. 

## 2014-01-14 NOTE — Progress Notes (Addendum)
   Subjective:   Patient ID: Nancy [name], female    DOB: 1956/03/19, 58 y.o.   MRN: [id number]  Nancy [name] is a pleasant 58 y.o. year old female who presents to clinic today with Follow-up  on 01/14/2014  HPI: I have not seen her for routine care since 10/2011.  H/o HLD-  Took simvastatin 20 mg for over a year. Noticed it was causing upset stomach- gasy, constipation, back pain, fatigue. Stopped taking it and those symptoms went away.  Has been working on diet- cutting back on saturated fats, sweets, fried foods Would like her labs rechecked today.  Lab Results  Component Value Date   CHOL 197 02/03/2012   HDL 72.80 02/03/2012   LDLCALC 103* 02/03/2012   LDLDIRECT 197.5 11/02/2011   TRIG 105.0 02/03/2012   CHOLHDL 3 02/03/2012   Wt Readings from Last 3 Encounters:  01/14/14 145 lb 8 oz (65.998 kg)  12/20/11 146 lb (66.225 kg)  11/02/11 146 lb (66.225 kg)   No current outpatient prescriptions on file prior to visit.   No current facility-administered medications on file prior to visit.    No Known Allergies  Past Medical History  Diagnosis Date  . Fatigue   . Hyperlipidemia   . Routine general medical examination at a health care facility     Past Surgical History  Procedure Laterality Date  . Partial hysterectomy    . Tubal ligation      No family history on file.  History   Social History  . Marital Status: Single    Spouse Name: N/A    Number of Children: 2  . Years of Education: N/A   Occupational History  . Not on file.   Social History Main Topics  . Smoking status: Never Smoker   . Smokeless tobacco: Not on file  . Alcohol Use: No  . Drug Use: No  . Sexual Activity: Not on file   Other Topics Concern  . Not on file   Social History Narrative   Divorced.  G3P2.   Regular exercise-yes   The PMH, PSH, Social History, Family History, Medications, and allergies have been reviewed in [facility], and have been updated if relevant.   Review of  Systems See HPI No CP or SOB No myalgias No anxiety or depression     Objective:    BP 118/72  Pulse 74  Temp(Src) 98.1 F (36.7 C) (Oral)  Ht 5' 2.75" (1.594 m)  Wt 145 lb 8 oz (65.998 kg)  BMI 25.97 kg/m2  SpO2 97%   Physical Exam  Nursing note and vitals reviewed. Constitutional: She appears well-developed and well-nourished.  HENT:  Head: Normocephalic.  Cardiovascular: Normal rate and regular rhythm.  Exam reveals no friction rub.   Pulmonary/Chest: Breath sounds normal. No respiratory distress. She has no wheezes. She has no rales.  Skin: Skin is warm.  Psychiatric: She has a normal mood and affect. Her behavior is normal. Judgment and thought content normal.          Assessment & Plan:   HYPERLIPIDEMIA - Plan: Comprehensive metabolic panel, Lipid panel  Routine general medical examination at a health care facility - Plan: CBC with Differential, TSH No Follow-up on file.

## 2014-01-14 NOTE — Patient Instructions (Signed)
Great to see you. We will call you with your lab results from today. 

## 2014-01-15 MED ORDER — PRAVASTATIN SODIUM 20 MG PO TABS: 20.0000 mg | ORAL_TABLET | Freq: Every day | ORAL | Status: DC

## 2014-01-15 NOTE — Addendum Note (Signed)
Addended by: Modena Nunnery on: 01/15/2014 03:34 PM   Modules accepted: Orders

## 2014-02-20 ENCOUNTER — Other Ambulatory Visit (INDEPENDENT_AMBULATORY_CARE_PROVIDER_SITE_OTHER): Payer: 59

## 2014-02-20 DIAGNOSIS — E785 Hyperlipidemia, unspecified: Secondary | ICD-10-CM

## 2014-02-20 DIAGNOSIS — R5381 Other malaise: Secondary | ICD-10-CM

## 2014-02-20 DIAGNOSIS — R5383 Other fatigue: Secondary | ICD-10-CM

## 2014-02-20 LAB — LIPID PANEL
CHOLESTEROL: 258 mg/dL — AB (ref 0–200)
HDL: 60.9 mg/dL (ref >39.00)
LDL Cholesterol: 167 mg/dL — ABNORMAL HIGH (ref 0–99)
NonHDL: 197.1
TRIGLYCERIDES: 152 mg/dL — AB (ref 0.0–149.0)
Total CHOL/HDL Ratio: 4
VLDL: 30.4 mg/dL (ref 0.0–40.0)

## 2014-02-20 LAB — COMPREHENSIVE METABOLIC PANEL
ALT: 17 U/L (ref 0–35)
AST: 18 U/L (ref 0–37)
Albumin: 3.8 g/dL (ref 3.5–5.2)
Alkaline Phosphatase: 76 U/L (ref 39–117)
BILIRUBIN TOTAL: 0.7 mg/dL (ref 0.2–1.2)
BUN: 13 mg/dL (ref 6–23)
CO2: 29 meq/L (ref 19–32)
CREATININE: 0.8 mg/dL (ref 0.4–1.2)
Calcium: 9.1 mg/dL (ref 8.4–10.5)
Chloride: 103 mEq/L (ref 96–112)
GFR: 84 mL/min (ref >60.00)
GLUCOSE: 95 mg/dL (ref 70–99)
Potassium: 4 mEq/L (ref 3.5–5.1)
Sodium: 139 mEq/L (ref 135–145)
TOTAL PROTEIN: 6.9 g/dL (ref 6.0–8.3)

## 2014-08-12 ENCOUNTER — Encounter: Payer: Self-pay | Admitting: Family Medicine

## 2014-08-12 ENCOUNTER — Ambulatory Visit (INDEPENDENT_AMBULATORY_CARE_PROVIDER_SITE_OTHER): Payer: 59 | Admitting: Family Medicine

## 2014-08-12 VITALS — BP 124/72 | HR 69 | Temp 97.9°F | Wt 148.0 lb

## 2014-08-12 DIAGNOSIS — J029 Acute pharyngitis, unspecified: Secondary | ICD-10-CM | POA: Insufficient documentation

## 2014-08-12 DIAGNOSIS — E785 Hyperlipidemia, unspecified: Secondary | ICD-10-CM

## 2014-08-12 MED ORDER — FLUTICASONE PROPIONATE 50 MCG/ACT NA SUSP: 2.0000 | Freq: Every day | NASAL | Status: DC

## 2014-08-12 MED ORDER — EZETIMIBE 10 MG PO TABS: 10.0000 mg | ORAL_TABLET | Freq: Every day | ORAL | Status: DC

## 2014-08-12 NOTE — Progress Notes (Signed)
Pre visit review using our clinic review tool, if applicable. No additional management support is needed unless otherwise documented below in the visit note. 

## 2014-08-12 NOTE — Progress Notes (Signed)
Subjective:   Patient ID: Nancy [name], female    DOB: 25-Jun-1955, 59 y.o.   MRN: [id number]  DYNEISHA MURCHISON is a pleasant 59 y.o. year old female who presents to clinic today with Follow-up  on 08/12/2014  HPI: HLD- h/o HLD.  Could not tolerate statins.  Most recently tried pravachol which also caused back pain and stiff joints like other statins had.  Lab Results  Component Value Date   CHOL 258* 02/20/2014   HDL 60.90 02/20/2014   LDLCALC 167* 02/20/2014   LDLDIRECT 197.5 11/02/2011   TRIG 152.0* 02/20/2014   CHOLHDL 4 02/20/2014   Sore throat- has been having a sore throat and more rhinorrhea/sinus pressure for last several weeks. No cough.  No SOB.  No difficulty swallowing.  Has tried an OTC [facility] rx for allergies but cannot remember which one.  Not using any nasal sprays.    Current Outpatient Prescriptions on File Prior to Visit  Medication Sig Dispense Refill  . pravastatin (PRAVACHOL) 20 MG tablet Take 1 tablet (20 mg total) by mouth daily. (Patient not taking: Reported on 08/12/2014) 30 tablet 1   No current facility-administered medications on file prior to visit.    No Known Allergies  Past Medical History  Diagnosis Date  . Fatigue   . Hyperlipidemia   . Routine general medical examination at a health care facility     Past Surgical History  Procedure Laterality Date  . Partial hysterectomy    . Tubal ligation      History reviewed. No pertinent family history.  History   Social History  . Marital Status: Single    Spouse Name: N/A  . Number of Children: 2  . Years of Education: N/A   Occupational History  . Not on file.   Social History Main Topics  . Smoking status: Never Smoker   . Smokeless tobacco: Not on file  . Alcohol Use: No  . Drug Use: No  . Sexual Activity: Not on file   Other Topics Concern  . Not on file   Social History Narrative   Divorced.  G3P2.   Regular exercise-yes   The PMH, PSH, Social History, Family History,  Medications, and allergies have been reviewed in [facility], and have been updated if relevant.    Review of Systems  Constitutional: Negative.   HENT: Positive for congestion, postnasal drip, rhinorrhea, sinus pressure and sore throat. Negative for drooling, facial swelling, hearing loss, nosebleeds, trouble swallowing and voice change.   Respiratory: Negative.   Cardiovascular: Negative.   Musculoskeletal: Positive for myalgias and joint swelling.  Skin: Negative.   Neurological: Negative.   Hematological: Negative.   Psychiatric/Behavioral: Negative.   All other systems reviewed and are negative.      Objective:    BP 124/72 mmHg  Pulse 69  Temp(Src) 97.9 F (36.6 C) (Oral)  Wt 148 lb (67.132 kg)  SpO2 97%   Physical Exam  Constitutional: She is oriented to person, place, and time. She appears well-developed and well-nourished. No distress.  HENT:  Head: Normocephalic.  Right Ear: Hearing and tympanic membrane normal.  Left Ear: Hearing and tympanic membrane normal.  Nose: Mucosal edema and rhinorrhea present. Right sinus exhibits no maxillary sinus tenderness and no frontal sinus tenderness. Left sinus exhibits no maxillary sinus tenderness and no frontal sinus tenderness.  Mouth/Throat: Uvula is midline and mucous membranes are normal. Posterior oropharyngeal erythema present. No oropharyngeal exudate or posterior oropharyngeal edema.  Cardiovascular: Normal rate.  Pulmonary/Chest: Effort normal.  Musculoskeletal: Normal range of motion.  Neurological: She is alert and oriented to person, place, and time. No cranial nerve deficit.  Skin: Skin is warm and dry.  Psychiatric: She has a normal mood and affect. Her behavior is normal. Judgment and thought content normal.  Nursing note and vitals reviewed.         Assessment & Plan:   HLD (hyperlipidemia) - Plan: Lipid panel, Comprehensive metabolic panel  Sore throat No Follow-up on file.

## 2014-08-12 NOTE — Assessment & Plan Note (Signed)
Discussed tx options.  Discussed zetia again today.  She is aware that it is not a statin and therefore does not have the same studies backing it's effects on cardiac mortality but it hopefully will lower her cholesterol. She does want to start it. eRx sent. Follow up labs in 8 weeks- ordered today.

## 2014-08-12 NOTE — Patient Instructions (Addendum)
Great to see you. I am sorry about your work stressors.  We are starting zetia 10 mg daily.  Please come back for labs in 8 weeks.  Start taking Zyrtec or Claritin or Allegra daily (antihistamines).

## 2014-08-12 NOTE — Assessment & Plan Note (Signed)
New- probable PND due to allergic rhinitis. Start flonase and antihistamine (if not already taking one- she will check when she gets home). Call or return to clinic prn if these symptoms worsen or fail to improve as anticipated. The patient indicates understanding of these issues and agrees with the plan.

## 2014-10-07 ENCOUNTER — Other Ambulatory Visit: Payer: 59

## 2014-10-10 ENCOUNTER — Other Ambulatory Visit (INDEPENDENT_AMBULATORY_CARE_PROVIDER_SITE_OTHER): Payer: 59

## 2014-10-10 ENCOUNTER — Telehealth: Payer: Self-pay | Admitting: *Deleted

## 2014-10-10 ENCOUNTER — Other Ambulatory Visit: Payer: Self-pay | Admitting: Family Medicine

## 2014-10-10 DIAGNOSIS — E785 Hyperlipidemia, unspecified: Secondary | ICD-10-CM

## 2014-10-10 LAB — LIPID PANEL
CHOL/HDL RATIO: 4
Cholesterol: 264 mg/dL — ABNORMAL HIGH (ref 0–200)
HDL: 69.2 mg/dL (ref >39.00)
LDL CALC: 180 mg/dL — AB (ref 0–99)
NonHDL: 194.8
TRIGLYCERIDES: 73 mg/dL (ref 0.0–149.0)
VLDL: 14.6 mg/dL (ref 0.0–40.0)

## 2014-10-10 LAB — COMPREHENSIVE METABOLIC PANEL
ALK PHOS: 77 U/L (ref 39–117)
ALT: 13 U/L (ref 0–35)
AST: 18 U/L (ref 0–37)
Albumin: 4.3 g/dL (ref 3.5–5.2)
BILIRUBIN TOTAL: 0.6 mg/dL (ref 0.2–1.2)
BUN: 15 mg/dL (ref 6–23)
CO2: 31 meq/L (ref 19–32)
CREATININE: 0.75 mg/dL (ref 0.40–1.20)
Calcium: 9.9 mg/dL (ref 8.4–10.5)
Chloride: 102 mEq/L (ref 96–112)
GFR: 83.82 mL/min (ref >60.00)
GLUCOSE: 96 mg/dL (ref 70–99)
Potassium: 4.9 mEq/L (ref 3.5–5.1)
SODIUM: 138 meq/L (ref 135–145)
Total Protein: 7 g/dL (ref 6.0–8.3)

## 2014-10-10 MED ORDER — ROSUVASTATIN CALCIUM 10 MG PO TABS: 10.0000 mg | ORAL_TABLET | Freq: Every day | ORAL | Status: DC

## 2014-10-10 NOTE — Telephone Encounter (Signed)
Pt came in for labs but states she only took zetia for a few weeks because the side effects, back pain and stiff joints were worse than when on statins. She doesn't have a f/u with you, but says if her chol is high, she'd rather go back on statins.

## 2014-10-10 NOTE — Telephone Encounter (Signed)
Noted! Thank you

## 2014-11-25 ENCOUNTER — Other Ambulatory Visit: Payer: Self-pay | Admitting: Family Medicine

## 2014-11-25 DIAGNOSIS — E785 Hyperlipidemia, unspecified: Secondary | ICD-10-CM

## 2014-12-02 ENCOUNTER — Other Ambulatory Visit: Payer: 59

## 2015-01-09 ENCOUNTER — Encounter: Payer: Self-pay | Admitting: *Deleted

## 2015-02-18 ENCOUNTER — Encounter: Payer: Self-pay | Admitting: Family Medicine

## 2015-02-18 ENCOUNTER — Ambulatory Visit (INDEPENDENT_AMBULATORY_CARE_PROVIDER_SITE_OTHER): Payer: 59 | Admitting: Family Medicine

## 2015-02-18 VITALS — BP 112/70 | HR 68 | Temp 98.0°F | Wt 151.0 lb

## 2015-02-18 DIAGNOSIS — F4323 Adjustment disorder with mixed anxiety and depressed mood: Secondary | ICD-10-CM | POA: Diagnosis not present

## 2015-02-18 DIAGNOSIS — F4320 Adjustment disorder, unspecified: Secondary | ICD-10-CM | POA: Insufficient documentation

## 2015-02-18 MED ORDER — CITALOPRAM HYDROBROMIDE 20 MG PO TABS: 20.0000 mg | ORAL_TABLET | Freq: Every day | ORAL | Status: DC

## 2015-02-18 NOTE — Progress Notes (Signed)
Pre visit review using our clinic review tool, if applicable. No additional management support is needed unless otherwise documented below in the visit note. 

## 2015-02-18 NOTE — Assessment & Plan Note (Signed)
New- >25 minutes spent in face to face time with patient, >50% spent in counselling or coordination of care Referral placed for psychotherapy. Discussed tx options- start celexa 20 mg daily. Follow up in 1 month. The patient indicates understanding of these issues and agrees with the plan.
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
## 2015-02-18 NOTE — Patient Instructions (Signed)
Great to see you. We are starting Celexa 20 mg daily - ok to break in half and take 10 mg daily for the few days.  Please stop by to see Rosaria Ferries or Vaughan Basta on your way out.  Please come see me in a month.

## 2015-02-27 ENCOUNTER — Ambulatory Visit (INDEPENDENT_AMBULATORY_CARE_PROVIDER_SITE_OTHER): Payer: 59 | Admitting: Psychology

## 2015-02-27 DIAGNOSIS — F4323 Adjustment disorder with mixed anxiety and depressed mood: Secondary | ICD-10-CM

## 2015-03-04 ENCOUNTER — Other Ambulatory Visit: Payer: Self-pay | Admitting: Obstetrics & Gynecology

## 2015-03-20 ENCOUNTER — Ambulatory Visit (INDEPENDENT_AMBULATORY_CARE_PROVIDER_SITE_OTHER): Payer: 59 | Admitting: Psychology

## 2015-03-20 DIAGNOSIS — F4323 Adjustment disorder with mixed anxiety and depressed mood: Secondary | ICD-10-CM

## 2015-03-25 ENCOUNTER — Encounter: Payer: Self-pay | Admitting: Family Medicine

## 2015-03-25 ENCOUNTER — Ambulatory Visit (INDEPENDENT_AMBULATORY_CARE_PROVIDER_SITE_OTHER): Payer: 59 | Admitting: Family Medicine

## 2015-03-25 VITALS — BP 112/72 | HR 62 | Temp 98.3°F | Wt 146.5 lb

## 2015-03-25 DIAGNOSIS — F4323 Adjustment disorder with mixed anxiety and depressed mood: Secondary | ICD-10-CM | POA: Diagnosis not present

## 2015-03-25 MED ORDER — FLUTICASONE PROPIONATE 50 MCG/ACT NA SUSP: 2.0000 | Freq: Every day | NASAL | Status: DC

## 2015-03-25 MED ORDER — CITALOPRAM HYDROBROMIDE 20 MG PO TABS: 20.0000 mg | ORAL_TABLET | Freq: Every day | ORAL | Status: DC

## 2015-03-25 NOTE — Progress Notes (Signed)
Pre visit review using our clinic review tool, if applicable. No additional management support is needed unless otherwise documented below in the visit note. 

## 2015-03-25 NOTE — Assessment & Plan Note (Signed)
>  15 minutes spent in face to face time with patient, >50% spent in counselling or coordination of care Symptoms improved with celexa and psychotherapy. Continue current treatment. No changes made. Follow up prn.

## 2015-03-25 NOTE — Progress Notes (Signed)
   Subjective:   Patient ID: Nancy [name], female    DOB: 15-Apr-1956, 59 y.o.   MRN: [id number]  Nancy [name] is a pleasant 59 y.o. year old female who presents to clinic today with Follow-up  on 03/25/2015  HPI:  Adjustment disorder- when I saw her last month,s he complained of several months of increased anhedonia, anxiety and feel sad.  Started celexa 20 mg daily and referred her for psychotherapy at that time.  Feels celexa is working well.  Not anxious.  Has been doing more things she enjoys. She is sleeping well.  Has also been to see Clint Bolder several and times and feels this has been helpful as well. Current Outpatient Prescriptions on File Prior to Visit  Medication Sig Dispense Refill  . citalopram (CELEXA) 20 MG tablet Take 1 tablet (20 mg total) by mouth daily. 30 tablet 3  . fluticasone (FLONASE) 50 MCG/ACT nasal spray Place 2 sprays into both nostrils daily. 16 g 6  . rosuvastatin (CRESTOR) 10 MG tablet Take 1 tablet (10 mg total) by mouth daily. 90 tablet 3  . ezetimibe (ZETIA) 10 MG tablet Take 1 tablet (10 mg total) by mouth daily. (Patient not taking: Reported on 03/25/2015) 90 tablet 3   No current facility-administered medications on file prior to visit.    No Known Allergies  Past Medical History  Diagnosis Date  . Fatigue   . Hyperlipidemia   . Routine general medical examination at a health care facility     Past Surgical History  Procedure Laterality Date  . Partial hysterectomy    . Tubal ligation      No family history on file.  Social History   Social History  . Marital Status: Single    Spouse Name: N/A  . Number of Children: 2  . Years of Education: N/A   Occupational History  . Not on file.   Social History Main Topics  . Smoking status: Never Smoker   . Smokeless tobacco: Never Used  . Alcohol Use: No  . Drug Use: No  . Sexual Activity: Not on file   Other Topics Concern  . Not on file   Social History Narrative   Divorced.  G3P2.   Regular exercise-yes   The PMH, PSH, Social History, Family History, Medications, and allergies have been reviewed in [facility], and have been updated if relevant.   Review of Systems  Constitutional: Negative.   Respiratory: Negative.   Gastrointestinal: Negative.   Psychiatric/Behavioral: Negative.   All other systems reviewed and are negative.      Objective:    BP 112/72 mmHg  Pulse 62  Temp(Src) 98.3 F (36.8 C) (Oral)  Wt 146 lb 8 oz (66.452 kg)   Physical Exam  Constitutional: She is oriented to person, place, and time. She appears well-developed and well-nourished. No distress.  HENT:  Head: Normocephalic.  Eyes: Conjunctivae are normal.  Cardiovascular: Normal rate.   Pulmonary/Chest: Effort normal.  Neurological: She is alert and oriented to person, place, and time. No cranial nerve deficit.  Skin: Skin is warm and dry.  Psychiatric: She has a normal mood and affect. Her behavior is normal. Thought content normal.  Nursing note and vitals reviewed.         Assessment & Plan:   Adjustment disorder with mixed anxiety and depressed mood No Follow-up on file.

## 2015-03-25 NOTE — Addendum Note (Signed)
Addended by: Lucille Passy on: 03/25/2015 10:42 AM   Modules accepted: Orders

## 2015-04-17 ENCOUNTER — Ambulatory Visit (INDEPENDENT_AMBULATORY_CARE_PROVIDER_SITE_OTHER): Payer: 59 | Admitting: Psychology

## 2015-04-17 DIAGNOSIS — F4323 Adjustment disorder with mixed anxiety and depressed mood: Secondary | ICD-10-CM

## 2015-06-04 ENCOUNTER — Encounter: Payer: Self-pay | Admitting: Internal Medicine

## 2015-06-04 ENCOUNTER — Ambulatory Visit (INDEPENDENT_AMBULATORY_CARE_PROVIDER_SITE_OTHER): Payer: 59 | Admitting: Internal Medicine

## 2015-06-04 VITALS — BP 122/64 | HR 82 | Temp 97.8°F | Wt 150.0 lb

## 2015-06-04 DIAGNOSIS — B379 Candidiasis, unspecified: Secondary | ICD-10-CM

## 2015-06-04 DIAGNOSIS — R3129 Other microscopic hematuria: Secondary | ICD-10-CM

## 2015-06-04 DIAGNOSIS — R300 Dysuria: Secondary | ICD-10-CM | POA: Diagnosis not present

## 2015-06-04 DIAGNOSIS — T3695XA Adverse effect of unspecified systemic antibiotic, initial encounter: Principal | ICD-10-CM

## 2015-06-04 MED ORDER — CIPROFLOXACIN HCL 500 MG PO TABS: 500.0000 mg | ORAL_TABLET | Freq: Two times a day (BID) | ORAL | Status: DC

## 2015-06-04 MED ORDER — FLUCONAZOLE 150 MG PO TABS: 150.0000 mg | ORAL_TABLET | Freq: Once | ORAL | Status: DC

## 2015-06-04 NOTE — Progress Notes (Signed)
HPI  Pt presents to the clinic today with c/o dysuria and bladder pressure. This started yesterday. She denies fever but did have some chills. She denies low back pain or vaginal concerns. She has not had any history of kidney stones. She has had a hysterectomy. She has not tried anything OTC.   Review of Systems  Past Medical History  Diagnosis Date  . Fatigue   . Hyperlipidemia   . Routine general medical examination at a health care facility     No family history on file.  Social History   Social History  . Marital Status: Single    Spouse Name: N/A  . Number of Children: 2  . Years of Education: N/A   Occupational History  . Not on file.   Social History Main Topics  . Smoking status: Never Smoker   . Smokeless tobacco: Never Used  . Alcohol Use: No  . Drug Use: No  . Sexual Activity: Not on file   Other Topics Concern  . Not on file   Social History Narrative   Divorced.  G3P2.   Regular exercise-yes    No Known Allergies  Constitutional: Denies fever, malaise, fatigue, headache or abrupt weight changes.   GU: Pt reports pain with urination. Denies urgency, dysuria, burning sensation, blood in urine, odor or discharge. Skin: Denies redness, rashes, lesions or ulcercations.   No other specific complaints in a complete review of systems (except as listed in HPI above).    Objective:   Physical Exam  BP 122/64 mmHg  Pulse 82  Temp(Src) 97.8 F (36.6 C) (Oral)  Wt 150 lb (68.04 kg)  SpO2 98% Wt Readings from Last 3 Encounters:  06/04/15 150 lb (68.04 kg)  03/25/15 146 lb 8 oz (66.452 kg)  02/18/15 151 lb (68.493 kg)    General: Appears her stated age, well developed, well nourished in NAD. Cardiovascular: Normal rate and rhythm. S1,S2 noted.   Pulmonary/Chest: Normal effort and positive vesicular breath sounds. No respiratory distress. No wheezes, rales or ronchi noted.  Abdomen: Soft. Normal bowel sounds. No distention or masses noted. Tender to  palpation over the bladder area. No CVA tenderness.      Assessment & Plan:   Bladder pressure and dysuria secondary to possibly UTI  Urinalysis: 2+ blood Will send urine culture eRx sent if for Cipro 500 mg BID x 5 days eRx for Diflucan for antibiotic induced yeast infection OK to take AZO OTC Drink plenty of fluids  RTC as needed or if symptoms persist.

## 2015-06-04 NOTE — Patient Instructions (Signed)

## 2015-06-05 LAB — POCT URINALYSIS DIPSTICK
Bilirubin, UA: NEGATIVE
Ketones, UA: NEGATIVE
Leukocytes, UA: NEGATIVE
NITRITE UA: NEGATIVE
UROBILINOGEN UA: NEGATIVE
pH, UA: 5

## 2015-06-05 NOTE — Addendum Note (Signed)
Addended by: Lurlean Nanny on: 06/05/2015 11:03 AM   Modules accepted: Orders

## 2015-06-07 LAB — URINE CULTURE: Colony Count: 50000

## 2015-12-13 ENCOUNTER — Emergency Department (HOSPITAL_COMMUNITY)
Admission: EM | Admit: 2015-12-13 | Discharge: 2015-12-13 | Disposition: A | Discharge status: Home / Self Care | Payer: BLUE CROSS/BLUE SHIELD | Attending: Emergency Medicine | Admitting: Emergency Medicine

## 2015-12-13 ENCOUNTER — Encounter (HOSPITAL_COMMUNITY): Payer: Self-pay

## 2015-12-13 ENCOUNTER — Emergency Department (HOSPITAL_COMMUNITY): Payer: BLUE CROSS/BLUE SHIELD

## 2015-12-13 DIAGNOSIS — Y929 Unspecified place or not applicable: Secondary | ICD-10-CM | POA: Diagnosis not present

## 2015-12-13 DIAGNOSIS — Z7982 Long term (current) use of aspirin: Secondary | ICD-10-CM | POA: Diagnosis not present

## 2015-12-13 DIAGNOSIS — E785 Hyperlipidemia, unspecified: Secondary | ICD-10-CM | POA: Insufficient documentation

## 2015-12-13 DIAGNOSIS — Y9352 Activity, horseback riding: Secondary | ICD-10-CM | POA: Diagnosis not present

## 2015-12-13 DIAGNOSIS — S20212A Contusion of left front wall of thorax, initial encounter: Secondary | ICD-10-CM | POA: Diagnosis not present

## 2015-12-13 DIAGNOSIS — R0781 Pleurodynia: Secondary | ICD-10-CM | POA: Diagnosis present

## 2015-12-13 DIAGNOSIS — Y999 Unspecified external cause status: Secondary | ICD-10-CM | POA: Insufficient documentation

## 2015-12-13 MED ORDER — TETANUS-DIPHTH-ACELL PERTUSSIS 5-2.5-18.5 LF-MCG/0.5 IM SUSP
0.5000 mL | Freq: Once | INTRAMUSCULAR | Status: AC
  Administered 2015-12-13: 0.5 mL via INTRAMUSCULAR
  Filled 2015-12-13: qty 0.5

## 2015-12-13 MED ORDER — HYDROCODONE-ACETAMINOPHEN 5-325 MG PO TABS: 1.0000 | ORAL_TABLET | Freq: Once | ORAL | Status: DC

## 2015-12-13 MED ORDER — ACETAMINOPHEN 500 MG PO TABS
1000.0000 mg | ORAL_TABLET | Freq: Once | ORAL | Status: AC
  Administered 2015-12-13: 1000 mg via ORAL
  Filled 2015-12-13: qty 2

## 2015-12-13 NOTE — ED Provider Notes (Signed)
CSN: [id number]     Arrival date & time 12/13/15  1421 History   First MD Initiated Contact with Patient 12/13/15 1439     Chief Complaint  Patient presents with  . Fall  . Ribcage Pain      (Consider location/radiation/quality/duration/timing/severity/associated sxs/prior Treatment) HPI 60 year old female who presents after fall with left-sided rib pain. She has a history of hyperlipidemia, does not take blood thinners, and is otherwise healthy. States that she was riding a horse this afternoon, and fell off onto her left side. She did not hit her head or have loss of consciousness. States that she landed on her left arm and left-sided chest wall, was able to stand and ambulate after her fall. States that she had left posterior and axillary rib pain subsequently. Worse with movement and deep inhalation. Denies shortness of breath, neck pain, back pain, abdominal pain.  Past Medical History  Diagnosis Date  . Fatigue   . Hyperlipidemia   . Routine general medical examination at a health care facility    Past Surgical History  Procedure Laterality Date  . Partial hysterectomy    . Tubal ligation    . Anterior cervical decomp/discectomy fusion     History reviewed. No pertinent family history. Social History  Substance Use Topics  . Smoking status: Never Smoker   . Smokeless tobacco: Never Used  . Alcohol Use: 0.0 oz/week    0 Standard drinks or equivalent per week     Comment: occ   OB History    No data available     Review of Systems 10/14 systems reviewed and are negative other than those stated in the HPI    Allergies  Review of patient's allergies indicates no known allergies.  Home Medications   Prior to Admission medications   Medication Sig Start Date End Date Taking? Authorizing Provider  aspirin 81 MG tablet Take 81 mg by mouth daily.    Historical Provider, MD  cetirizine (ZYRTEC) 10 MG tablet Take 10 mg by mouth daily.    Historical Provider, MD   ciprofloxacin (CIPRO) 500 MG tablet Take 1 tablet (500 mg total) by mouth 2 (two) times daily. 06/04/15   Jearld Fenton, NP  citalopram (CELEXA) 20 MG tablet Take 1 tablet (20 mg total) by mouth daily. 03/25/15   Lucille Passy, MD  fluconazole (DIFLUCAN) 150 MG tablet Take 1 tablet (150 mg total) by mouth once. 06/04/15   Jearld Fenton, NP  fluticasone (FLONASE) 50 MCG/ACT nasal spray Place 2 sprays into both nostrils daily. Patient not taking: Reported on 06/04/2015 03/25/15   Lucille Passy, MD   BP 191/83 mmHg  Pulse 71  Temp(Src) 98.2 F (36.8 C) (Oral)  Resp 16  SpO2 99% Physical Exam Physical Exam  Nursing note and vitals reviewed. Constitutional: Well developed, well nourished, non-toxic, and in no acute distress Head: Normocephalic and atraumatic.  Mouth/Throat: Oropharynx is clear and moist.  Neck: Normal range of motion. Neck supple. No cervical spine tenderness. Cardiovascular: Normal rate and regular rhythm.   Pulmonary/Chest: Effort normal and breath sounds normal. No crepitus, deformities or bruising. There is tenderness over posterior and axillary  4-5th ribs on the left.  Abdominal: Soft. There is no tenderness. There is no rebound and no guarding.  Musculoskeletal: Normal range of motion.  Neurological: Alert, no facial droop, fluent speech, moves all extremities symmetrically Skin: Skin is warm and dry. Small abrasion over the left forearm. Psychiatric: Cooperative  ED Course  Procedures (  including critical care time) Labs Review Labs Reviewed - No data to display  Imaging Review Dg Ribs Unilateral W/chest Left  12/13/2015  CLINICAL DATA:  Left chest wall pain after injury EXAM: LEFT RIBS AND CHEST - 3+ VIEW COMPARISON:  07/26/2012 chest radiograph. FINDINGS: Stable cardiomediastinal silhouette with normal heart size. No pneumothorax. No pleural effusion. No pulmonary edema. No acute consolidative airspace disease. Tiny nodular densities at the right lung base are  stable back to 2011 and probably represent either benign bone islands or small granulomas. The area of symptomatic concern as indicated by the patient in the left lower chest wall was denoted by the technologist with a metallic skin BB. No fracture or suspicious focal osseous lesion is seen in the left ribs. IMPRESSION: No left rib fracture. No active cardiopulmonary disease. Should the patient's symptoms persist or worsen, repeat radiographs of the ribs in 10 - 14 days maybe of use to detect subtle nondisplaced rib fractures (which are commonly occult on initial imaging). Electronically Signed   By: Ilona Sorrel M.D.   On: 12/13/2015 15:10   I have personally reviewed and evaluated these images and lab results as part of my medical decision-making.   EKG Interpretation None      MDM   Final diagnoses:  Rib contusion, left, initial encounter    60 year old female who presents with left-sided rib pain after fall from horse. On presentation she is well-appearing and in no acute distress with stable vital signs. Tenderness to palpation of the posterior left-sided fourth and fifth ribs to the midaxillary line. No evidence of head or neck trauma, no other injuries noted aside from some superficial abrasions to the left forearm. Tetanus is updated. Chest and rib X-rays are without fractures. Discussed treatment for bruised ribs vs occult fracture similar and reviewed supportive care instructions. Strict return and follow-up instructions reviewed. She expressed understanding of all discharge instructions and felt comfortable with the plan of care.     Forde Dandy, MD 12/13/15 1524

## 2015-12-13 NOTE — ED Notes (Signed)
Pt c/o L midback pain radiating into L anterior ribcage after being thrown from a horse this afternoon.  Pain score 6/10 w/ movement.  No bruising noted.

## 2015-12-13 NOTE — Discharge Instructions (Signed)
Use heat or ice packs for pain and take tylenol and anti-inflammatory medications as needed. Return for worsening symptoms, including escalating pain, difficulty breathing, fever, or any other symptoms concerning to you.  Your chest x-ray does not show fracture.   Rib Contusion A rib contusion is a deep bruise on your rib area. Contusions are the result of a blunt trauma that causes bleeding and injury to the tissues under the skin. A rib contusion may involve bruising of the ribs and of the skin and muscles in the area. The skin overlying the contusion may turn blue, purple, or yellow. Minor injuries will give you a painless contusion, but more severe contusions may stay painful and swollen for a few weeks. CAUSES  A contusion is usually caused by a blow, trauma, or direct force to an area of the body. This often occurs while playing contact sports. SYMPTOMS  Swelling and redness of the injured area.  Discoloration of the injured area.  Tenderness and soreness of the injured area.  Pain with or without movement. DIAGNOSIS  The diagnosis can be made by taking a medical history and performing a physical exam. An X-ray, CT scan, or MRI may be needed to determine if there were any associated injuries, such as broken bones (fractures) or internal injuries. TREATMENT  Often, the best treatment for a rib contusion is rest. Icing or applying cold compresses to the injured area may help reduce swelling and inflammation. Deep breathing exercises may be recommended to reduce the risk of partial lung collapse and pneumonia. Over-the-counter or prescription medicines may also be recommended for pain control. HOME CARE INSTRUCTIONS   Apply ice to the injured area:  Put ice in a plastic bag.  Place a towel between your skin and the bag.  Leave the ice on for 20 minutes, 2-3 times per day.  Take medicines only as directed by your health care provider.  Rest the injured area. Avoid strenuous  activity and any activities or movements that cause pain. Be careful during activities and avoid bumping the injured area.  Perform deep-breathing exercises as directed by your health care provider.  Do not lift anything that is heavier than 5 lb (2.3 kg) until your health care provider approves.  Do not use any tobacco products, including cigarettes, chewing tobacco, or electronic cigarettes. If you need help quitting, ask your health care provider. SEEK MEDICAL CARE IF:   You have increased bruising or swelling.  You have pain that is not controlled with treatment.  You have a fever. SEEK IMMEDIATE MEDICAL CARE IF:   You have difficulty breathing or shortness of breath.  You develop a continual cough, or you cough up thick or bloody sputum.  You feel sick to your stomach (nauseous), you throw up (vomit), or you have abdominal pain.   This information is not intended to replace advice given to you by your health care provider. Make sure you discuss any questions you have with your health care provider.   Document Released: 02/23/2001 Document Revised: 06/21/2014 Document Reviewed: 03/12/2014 Elsevier Interactive Patient Education [employer].

## 2016-01-22 ENCOUNTER — Other Ambulatory Visit: Payer: Self-pay | Admitting: *Deleted

## 2016-01-22 MED ORDER — CITALOPRAM HYDROBROMIDE 20 MG PO TABS: 20.0000 mg | ORAL_TABLET | Freq: Every day | ORAL | 0 refills | Status: DC

## 2016-02-21 ENCOUNTER — Other Ambulatory Visit: Payer: Self-pay | Admitting: Family Medicine

## 2016-02-23 ENCOUNTER — Ambulatory Visit (INDEPENDENT_AMBULATORY_CARE_PROVIDER_SITE_OTHER): Payer: 59 | Admitting: Family Medicine

## 2016-02-23 ENCOUNTER — Ambulatory Visit (INDEPENDENT_AMBULATORY_CARE_PROVIDER_SITE_OTHER)
Admission: RE | Admit: 2016-02-23 | Discharge: 2016-02-23 | Disposition: A | Discharge status: Home / Self Care | Payer: 59 | Source: Ambulatory Visit | Attending: Family Medicine | Admitting: Family Medicine

## 2016-02-23 ENCOUNTER — Encounter: Payer: Self-pay | Admitting: Family Medicine

## 2016-02-23 DIAGNOSIS — R0781 Pleurodynia: Secondary | ICD-10-CM | POA: Diagnosis not present

## 2016-02-23 NOTE — Patient Instructions (Signed)
Great to see you.  I will call you with your xray results. 

## 2016-02-23 NOTE — Assessment & Plan Note (Signed)
Persistent rib pain. Repeat rib xrays along with CXR today. Tylenol as needed. The patient indicates understanding of these issues and agrees with the plan. Orders Placed This Encounter  Procedures  . DG Ribs Unilateral Left  . DG Chest 2 View

## 2016-02-23 NOTE — Progress Notes (Signed)
Subjective:   Patient ID: Nancy [name], female    DOB: 29-Oct-1955, 60 y.o.   MRN: [id number]  Nancy [name] is a pleasant 60 y.o. year old female who presents to clinic today with Fall (off horse) and Breast Pain (left)  on 61/04/2016  HPI:  [place] off of a horse on 12/13/2015. Went to ER for left rib pain then.  Note reviewed. Left lateral rib xray neg.  Pain was getting better.  Then tripped over a rope a month ago and landed on the same side.  Now having persistent left rib pain with movement.  No SOB No fevers No cough   Current Outpatient Prescriptions on File Prior to Visit  Medication Sig Dispense Refill  . aspirin 81 MG tablet Take 81 mg by mouth daily.    . cetirizine (ZYRTEC) 10 MG tablet Take 10 mg by mouth daily.    . citalopram (CELEXA) 20 MG tablet Take 1 tablet (20 mg total) by mouth daily. COMPLETE PHYSICAL EXAM REQUIRED FOR ADDITIONAL REFILLS 30 tablet 0   No current facility-administered medications on file prior to visit.     No Known Allergies  Past Medical History:  Diagnosis Date  . Fatigue   . Hyperlipidemia   . Routine general medical examination at a health care facility     Past Surgical History:  Procedure Laterality Date  . ANTERIOR CERVICAL DECOMP/DISCECTOMY FUSION    . PARTIAL HYSTERECTOMY    . TUBAL LIGATION      No family history on file.  Social History   Social History  . Marital status: Single    Spouse name: N/A  . Number of children: 2  . Years of education: N/A   Occupational History  . Not on file.   Social History Main Topics  . Smoking status: Never Smoker  . Smokeless tobacco: Never Used  . Alcohol use 0.0 oz/week     Comment: occ  . Drug use: No  . Sexual activity: Not on file   Other Topics Concern  . Not on file   Social History Narrative   Divorced.  G3P2.   Regular exercise-yes   The PMH, PSH, Social History, Family History, Medications, and allergies have been reviewed in [facility], and have been updated  if relevant.   Review of Systems  Respiratory: Negative for cough and shortness of breath.        + rib pain  Cardiovascular: Negative.   Musculoskeletal: Positive for back pain.  Neurological: Negative.   All other systems reviewed and are negative.      Objective:    BP 122/78   Pulse 68   Temp 98.3 F (36.8 C) (Oral)   Wt 149 lb (67.6 kg)   SpO2 97%   BMI 26.60 kg/m    Physical Exam  Constitutional: She is oriented to person, place, and time. She appears well-developed and well-nourished. No distress.  HENT:  Head: Normocephalic.  Eyes: Conjunctivae are normal.  Cardiovascular: Normal rate.   Pulmonary/Chest: No respiratory distress. She has no wheezes. She has no rales. She exhibits tenderness.  Musculoskeletal: Normal range of motion.  Neurological: She is alert and oriented to person, place, and time.  Skin: Skin is warm and dry. She is not diaphoretic.  Psychiatric: She has a normal mood and affect. Her behavior is normal. Judgment and thought content normal.  Nursing note and vitals reviewed.         Assessment & Plan:   Rib pain on  left side - Plan: DG Ribs Unilateral Left, DG Chest 2 View No Follow-up on file.

## 2016-02-23 NOTE — Progress Notes (Signed)
Pre visit review using our clinic review tool, if applicable. No additional management support is needed unless otherwise documented below in the visit note. 

## 2016-04-19 ENCOUNTER — Other Ambulatory Visit: Payer: Self-pay | Admitting: Family Medicine

## 2016-05-20 ENCOUNTER — Other Ambulatory Visit: Payer: Self-pay | Admitting: Family Medicine

## 2016-06-20 ENCOUNTER — Other Ambulatory Visit: Payer: Self-pay | Admitting: Family Medicine

## 2016-07-23 ENCOUNTER — Other Ambulatory Visit: Payer: Self-pay | Admitting: Family Medicine

## 2016-07-23 NOTE — Telephone Encounter (Signed)
Lm on pts vm and advised OV required for additional refills. Pt has not had f/u since 03/2015

## 2016-08-25 ENCOUNTER — Other Ambulatory Visit: Payer: Self-pay | Admitting: Family Medicine

## 2016-08-25 NOTE — Telephone Encounter (Signed)
Spoke to pt. Advised her she needed an OV to discuss depression. It has been over a year since she was seen. She said she is weaning herself off of it. If she thinks she needs to continue on it, she will call and make an OV. We will do 1 refill for now.

## 2017-10-25 ENCOUNTER — Ambulatory Visit (INDEPENDENT_AMBULATORY_CARE_PROVIDER_SITE_OTHER): Payer: No Typology Code available for payment source | Admitting: Orthopaedic Surgery

## 2017-10-25 ENCOUNTER — Ambulatory Visit (INDEPENDENT_AMBULATORY_CARE_PROVIDER_SITE_OTHER): Payer: Self-pay

## 2017-10-25 ENCOUNTER — Encounter (INDEPENDENT_AMBULATORY_CARE_PROVIDER_SITE_OTHER): Payer: Self-pay

## 2017-10-25 ENCOUNTER — Encounter (INDEPENDENT_AMBULATORY_CARE_PROVIDER_SITE_OTHER): Payer: Self-pay | Admitting: Orthopaedic Surgery

## 2017-10-25 ENCOUNTER — Ambulatory Visit (INDEPENDENT_AMBULATORY_CARE_PROVIDER_SITE_OTHER): Payer: No Typology Code available for payment source

## 2017-10-25 VITALS — BP 136/82 | HR 94 | Resp 16 | Ht 63.0 in | Wt 150.0 lb

## 2017-10-25 DIAGNOSIS — M79645 Pain in left finger(s): Secondary | ICD-10-CM

## 2017-10-25 MED ORDER — DICLOFENAC SODIUM 1 % TD GEL: TRANSDERMAL | 2 refills | Status: DC

## 2017-10-25 NOTE — Progress Notes (Signed)
Office Visit Note   Patient: Nancy [name]           Date of Birth: August 31, 1955           MRN: [id number] Visit Date: 10/25/2017              Requested by: Lucille Passy, MD [place], [place] [id number] PCP: Lucille Passy, MD   Assessment & Plan: Visit Diagnoses:  1. 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  Having some discomfort with limited motion at the metacarpal phalangeal joint from an old injury of 12 to 13 years ago. probably had ligamentous injury with resultant limited flexion.  No acute change but does have some mild arthritis.  Will suggest Voltaren gel.  Consider local cortisone injection in future  Follow-Up Instructions: No follow-ups on file.   Orders:  Orders Placed This Encounter  Procedures  . XR Finger Thumb Left   Meds ordered this encounter  Medications  . diclofenac sodium (VOLTAREN) 1 % GEL    Sig: APPLY TO LARGE JOINT AREA AS NEEDED    Dispense:  2 Tube    Refill:  2      Procedures: No procedures performed   Clinical Data: No additional findings.   Subjective: Chief Complaint  Patient presents with  . Left Hand - Pain  . New Patient (Initial Visit)    L THUMB PAIN FOR 6 MONTHS, GETTING WORSE,. INJURY- FELL ON IT YEARS AGO   Injury to left thumb approximately 12 years ago.  Seen by another orthopedist without specific treatment.  Over the years [name] is had some loss of flexion across the metacarpal phalangeal joint but is basically "live with it".  Recently she has had some increased pain at the base of her thumb as well as at the metacarpal phalangeal joint.  No new injury or trauma.  Numbness or tingling.  HPI  Review of Systems  Constitutional: Negative for fatigue and fever.  HENT: Negative for ear pain.   Eyes: Negative for pain.  Respiratory: Negative for cough and shortness of breath.   Cardiovascular: Negative for leg swelling.    Gastrointestinal: Negative for constipation and diarrhea.  Genitourinary: Negative for difficulty urinating.  Musculoskeletal: Negative for back pain and neck pain.  Skin: Negative for rash.  Allergic/Immunologic: Negative for food allergies.  Neurological: Negative for weakness and numbness.  Hematological: Does not bruise/bleed easily.  Psychiatric/Behavioral: Negative for sleep disturbance.     Objective: Vital Signs: BP 136/82 (BP Location: Left Arm, Patient Position: Sitting, Cuff Size: Normal)   Pulse 94   Resp 16   Ht 5\' 3"  (1.6 m)   Wt 150 lb (68 kg)   BMI 26.57 kg/m   Physical Exam  Constitutional: She is oriented to person, place, and time. She appears well-developed and well-nourished.  HENT:  Mouth/Throat: Oropharynx is clear and moist.  Eyes: Pupils are equal, round, and reactive to light. EOM are normal.  Pulmonary/Chest: Effort normal.  Neurological: She is alert and oriented to person, place, and time.  Skin: Skin is warm and dry.  Psychiatric: She has a normal mood and affect. Her behavior is normal.    Ortho Exam awake alert and oriented x3.  Comfortable sitting.  Exam of the left thumb demonstrates some mild positive grind test at the metacarpal carpal joint.  No particular swelling or erythema.  Minimal discomfort.  Increased extension and decreased flexion across the metacarpal phalangeal joint compared to the right thumb.  Mild discomfort circumferentially.  No erythema or swelling.  Good capillary refill to tip of thumb Normal sensibility.  No instability  Specialty Comments:  No specialty comments available.  Imaging: Xr Finger Thumb Left  Result Date: 10/25/2017 Films of the left thumb were obtained in several projections.  Degenerative change at the base of the thumb at the metacarpal carpal joint.  There is flattening of the joint surface and partial subluxation.  Minor degenerative changes at the metacarpal phalangeal joint.  Prominent sesamoid  bone no acute change    PMFS History: Patient Active Problem List   Diagnosis Date Noted  . Rib pain on left side 02/23/2016  . Adjustment disorder 02/18/2015  . HLD (hyperlipidemia) 08/25/2010  . FATIGUE 07/14/2010   Past Medical History:  Diagnosis Date  . Fatigue   . Hyperlipidemia   . Routine general medical examination at a health care facility     History reviewed. No pertinent family history.  Past Surgical History:  Procedure Laterality Date  . ANTERIOR CERVICAL DECOMP/DISCECTOMY FUSION    . PARTIAL HYSTERECTOMY    . TUBAL LIGATION     Social History   Occupational History  . Not on file  Tobacco Use  . Smoking status: Never Smoker  . Smokeless tobacco: Never Used  Substance and Sexual Activity  . Alcohol use: Yes    Alcohol/week: 0.0 oz    Comment: occ  . Drug use: No  . Sexual activity: Not on file

## 2017-12-05 ENCOUNTER — Ambulatory Visit (INDEPENDENT_AMBULATORY_CARE_PROVIDER_SITE_OTHER): Payer: No Typology Code available for payment source | Admitting: Internal Medicine

## 2017-12-05 ENCOUNTER — Encounter: Payer: Self-pay | Admitting: Internal Medicine

## 2017-12-05 VITALS — BP 128/84 | HR 76 | Temp 98.1°F | Wt 157.0 lb

## 2017-12-05 DIAGNOSIS — R0982 Postnasal drip: Secondary | ICD-10-CM

## 2017-12-05 DIAGNOSIS — R05 Cough: Secondary | ICD-10-CM | POA: Diagnosis not present

## 2017-12-05 DIAGNOSIS — R059 Cough, unspecified: Secondary | ICD-10-CM

## 2017-12-05 MED ORDER — FLUTICASONE PROPIONATE 50 MCG/ACT NA SUSP: 2.0000 | Freq: Every day | NASAL | 1 refills | Status: DC

## 2017-12-05 NOTE — Addendum Note (Signed)
Addended by: Lurlean Nanny on: 12/05/2017 04:33 PM   Modules accepted: Orders

## 2017-12-05 NOTE — Progress Notes (Signed)
HPI  Pt presents to the clinic today with c/o nasal congestion and cough. She reports this started 1ago. She is not able to blow anything out of her nose. The cough is mostly non productive. She denies headache, ear pain runny nose, sore throat or shortness of breath. She denies fever, chills or body aches. She has tried allergy medicine and Mucinex OTC with minimal relief. She has no history of allergies. She has not had sick contacts.  Review of Systems      Past Medical History:  Diagnosis Date  . Fatigue   . Hyperlipidemia   . Routine general medical examination at a health care facility     No family history on file.  Social History   Socioeconomic History  . Marital status: Single    Spouse name: Not on file  . Number of children: 2  . Years of education: Not on file  . Highest education level: Not on file  Occupational History  . Not on file  Social Needs  . Financial resource strain: Not on file  . Food insecurity:    Worry: Not on file    Inability: Not on file  . Transportation needs:    Medical: Not on file    Non-medical: Not on file  Tobacco Use  . Smoking status: Never Smoker  . Smokeless tobacco: Never Used  Substance and Sexual Activity  . Alcohol use: Yes    Alcohol/week: 0.0 oz    Comment: occ  . Drug use: No  . Sexual activity: Not on file  Lifestyle  . Physical activity:    Days per week: Not on file    Minutes per session: Not on file  . Stress: Not on file  Relationships  . Social connections:    Talks on phone: Not on file    Gets together: Not on file    Attends religious service: Not on file    Active member of club or organization: Not on file    Attends meetings of clubs or organizations: Not on file    Relationship status: Not on file  . Intimate partner violence:    Fear of current or ex partner: Not on file    Emotionally abused: Not on file    Physically abused: Not on file    Forced sexual activity: Not on file  Other  Topics Concern  . Not on file  Social History Narrative   Divorced.  G3P2.   Regular exercise-yes    No Known Allergies   Constitutional:  Denies headache, fatigue, fever or abrupt weight changes.  HEENT:  Positive nasal congestion. Denies eye redness, eye pain, pressure behind the eyes, facial pain, ear pain, ringing in the ears, wax buildup, runny nose or sore throat. Respiratory: Positive cough. Denies difficulty breathing or shortness of breath.  Cardiovascular: Denies chest pain, chest tightness, palpitations or swelling in the hands or feet.   No other specific complaints in a complete review of systems (except as listed in HPI above).  Objective:   BP 128/84   Pulse 76   Temp 98.1 F (36.7 C) (Oral)   Wt 157 lb (71.2 kg)   SpO2 98%   BMI 27.81 kg/m   Wt Readings from Last 3 Encounters:  12/05/17 157 lb (71.2 kg)  10/25/17 150 lb (68 kg)  02/23/16 149 lb (67.6 kg)     General: Appears her stated age, well developed, well nourished in NAD. HEENT: Head: normal shape and size, no sinus  tenderness noted; Ears: Tm's gray and intact, normal light reflex; Nose: mucosa pink and moist, septum midline; Throat/Mouth: + PND. Teeth present, mucosa erythematous and moist, no exudate noted, no lesions or ulcerations noted.  Neck: No cervical lymphadenopathy.  Cardiovascular: Normal rate and rhythm.  Pulmonary/Chest: Normal effort and positive vesicular breath sounds. No respiratory distress. No wheezes, rales or ronchi noted.       Assessment & Plan:   Cough secondary to PND:  Get some rest and drink plenty of water Start Zyrtec and Flonase OTC Delsym OTC as needed for cough  RTC as needed or if symptoms persist.   Webb Silversmith, NP

## 2017-12-05 NOTE — Patient Instructions (Signed)

## 2018-01-09 ENCOUNTER — Ambulatory Visit (INDEPENDENT_AMBULATORY_CARE_PROVIDER_SITE_OTHER): Payer: No Typology Code available for payment source | Admitting: Primary Care

## 2018-01-09 ENCOUNTER — Encounter: Payer: Self-pay | Admitting: Primary Care

## 2018-01-09 VITALS — BP 122/84 | HR 80 | Temp 98.0°F | Ht 63.0 in | Wt 159.5 lb

## 2018-01-09 DIAGNOSIS — E785 Hyperlipidemia, unspecified: Secondary | ICD-10-CM | POA: Diagnosis not present

## 2018-01-09 DIAGNOSIS — Z1159 Encounter for screening for other viral diseases: Secondary | ICD-10-CM | POA: Diagnosis not present

## 2018-01-09 DIAGNOSIS — E663 Overweight: Secondary | ICD-10-CM | POA: Insufficient documentation

## 2018-01-09 HISTORY — DX: Overweight: E66.3

## 2018-01-09 LAB — COMPREHENSIVE METABOLIC PANEL
ALT: 13 U/L (ref 0–35)
AST: 16 U/L (ref 0–37)
Albumin: 4.2 g/dL (ref 3.5–5.2)
Alkaline Phosphatase: 80 U/L (ref 39–117)
BUN: 12 mg/dL (ref 6–23)
CALCIUM: 9.6 mg/dL (ref 8.4–10.5)
CHLORIDE: 103 meq/L (ref 96–112)
CO2: 31 meq/L (ref 19–32)
Creatinine, Ser: 0.77 mg/dL (ref 0.40–1.20)
GFR: 80.45 mL/min (ref >60.00)
Glucose, Bld: 104 mg/dL — ABNORMAL HIGH (ref 70–99)
Potassium: 4.6 mEq/L (ref 3.5–5.1)
Sodium: 141 mEq/L (ref 135–145)
Total Bilirubin: 0.6 mg/dL (ref 0.2–1.2)
Total Protein: 7.4 g/dL (ref 6.0–8.3)

## 2018-01-09 LAB — LIPID PANEL
CHOL/HDL RATIO: 4
Cholesterol: 291 mg/dL — ABNORMAL HIGH (ref 0–200)
HDL: 66.1 mg/dL (ref >39.00)
LDL CALC: 190 mg/dL — AB (ref 0–99)
NonHDL: 224.65
Triglycerides: 171 mg/dL — ABNORMAL HIGH (ref 0.0–149.0)
VLDL: 34.2 mg/dL (ref 0.0–40.0)

## 2018-01-09 LAB — TSH: TSH: 0.86 u[IU]/mL (ref 0.35–4.50)

## 2018-01-09 NOTE — Assessment & Plan Note (Signed)
Chronic and cannot tolerate statins or Zetia. Has failed Crestor, atorvastatin, simvastatin. Consider pravastatin.  Lipids pending. Discussed to work on her diet and start exercising.

## 2018-01-09 NOTE — Progress Notes (Signed)
Subjective:    Patient ID: Nancy [name], female    DOB: 07/13/1955, 61 y.o.   MRN: [id number]  HPI  Nancy [name] is a 62 year old female who presents today to transfer care from Dr. Deborra Medina. She would like labs today including TSH due to weight gain. She does have a family history of thyroid cancer and disease in non-direct relatives.   1) Hyperlipidemia: Long history of hyperlipidemia, could not tolerate statins and Zetia due to severe myalgias.   2) Obesity: She is frustrated by her weight gain over this year. She is not exercising and endorses a poor diet. She does have a family history of thyroid disease including cancer.   Diet currently consists of:  Breakfast: Chocolate milk  Lunch: Fast food, beans, peanut butter crackers  Dinner: Vegetables, lean meat, potatoes Snacks: Chocolate milk, fruit Desserts: Cookies. Daily  Beverages: Chocolate milk, un-sweet tea, some water, occasional Gatorade   Exercise: She is not exercising now. Is active in her yard.   Wt Readings from Last 3 Encounters:  01/09/18 159 lb 8 oz (72.3 kg)  12/05/17 157 lb (71.2 kg)  10/25/17 150 lb (68 kg)      Review of Systems  Constitutional: Negative for unexpected weight change.  Eyes: Negative for visual disturbance.  Respiratory: Negative for shortness of breath.   Cardiovascular: Negative for chest pain.  Neurological: Negative for dizziness, weakness and headaches.        Past Medical History:  Diagnosis Date  . Fatigue   . Hyperlipidemia   . Lichen sclerosus   . Routine general medical examination at a health care facility      Social History   Socioeconomic History  . Marital status: Single    Spouse name: Not on file  . Number of children: 2  . Years of education: Not on file  . Highest education level: Not on file  Occupational History  . Not on file  Social Needs  . Financial resource strain: Not on file  . Food insecurity:    Worry: Not on file    Inability: Not on file   . Transportation needs:    Medical: Not on file    Non-medical: Not on file  Tobacco Use  . Smoking status: Never Smoker  . Smokeless tobacco: Never Used  Substance and Sexual Activity  . Alcohol use: Yes    Alcohol/week: 0.0 oz    Comment: occ  . Drug use: No  . Sexual activity: Not on file  Lifestyle  . Physical activity:    Days per week: Not on file    Minutes per session: Not on file  . Stress: Not on file  Relationships  . Social connections:    Talks on phone: Not on file    Gets together: Not on file    Attends religious service: Not on file    Active member of club or organization: Not on file    Attends meetings of clubs or organizations: Not on file    Relationship status: Not on file  . Intimate partner violence:    Fear of current or ex partner: Not on file    Emotionally abused: Not on file    Physically abused: Not on file    Forced sexual activity: Not on file  Other Topics Concern  . Not on file  Social History Narrative   Divorced.  G3P2.   Regular exercise-yes    Past Surgical History:  Procedure Laterality Date  .  ANTERIOR CERVICAL DECOMP/DISCECTOMY FUSION    . PARTIAL HYSTERECTOMY    . TUBAL LIGATION      Family History  Problem Relation Age of Onset  . Hydrocephalus Mother   . Neuropathy Mother   . Diabetes Father   . Hypertension Father   . Thyroid cancer Cousin   . Thyroid disease Maternal Aunt     Allergies  Allergen Reactions  . Statins Other (See Comments)    Myalgias     Current Outpatient Medications on File Prior to Visit  Medication Sig Dispense Refill  . fluticasone (FLONASE) 50 MCG/ACT nasal spray Place 2 sprays into both nostrils daily. (Patient not taking: Reported on 01/09/2018) 16 g 1   No current facility-administered medications on file prior to visit.     BP 122/84   Pulse 80   Temp 98 F (36.7 C) (Oral)   Ht 5\' 3"  (1.6 m)   Wt 159 lb 8 oz (72.3 kg)   SpO2 98%   BMI 28.25 kg/m    Objective:    Physical Exam  Constitutional: She appears well-nourished.  Neck: Neck supple.  Cardiovascular: Normal rate and regular rhythm.  Respiratory: Effort normal and breath sounds normal.  Skin: Skin is warm and dry.  Psychiatric: She has a normal mood and affect.           Assessment & Plan:

## 2018-01-09 NOTE — Assessment & Plan Note (Signed)
Discussed to limit fast food, chocolate milk, sweets. Also discussed to resume regular exercise. TSH pending.

## 2018-01-09 NOTE — Patient Instructions (Signed)
Stop by the lab prior to leaving today. I will notify you of your results once received.   Start exercising. You should be getting 150 minutes of moderate intensity exercise weekly.  It's important to improve your diet by reducing consumption of fast food, fried food, processed snack foods, sugary drinks. Increase consumption of fresh vegetables and fruits, whole grains, water.  Ensure you are drinking 64 ounces of water daily.  It was a pleasure meeting you!

## 2018-01-10 LAB — HEPATITIS C ANTIBODY
Hepatitis C Ab: NONREACTIVE
SIGNAL TO CUT-OFF: 0.01 (ref <1.00)

## 2018-01-12 ENCOUNTER — Other Ambulatory Visit: Payer: Self-pay | Admitting: Primary Care

## 2018-01-12 DIAGNOSIS — R7309 Other abnormal glucose: Secondary | ICD-10-CM

## 2018-01-13 ENCOUNTER — Telehealth: Payer: Self-pay | Admitting: Primary Care

## 2018-01-13 ENCOUNTER — Other Ambulatory Visit (INDEPENDENT_AMBULATORY_CARE_PROVIDER_SITE_OTHER): Payer: No Typology Code available for payment source

## 2018-01-13 DIAGNOSIS — R7309 Other abnormal glucose: Secondary | ICD-10-CM

## 2018-01-13 LAB — POCT GLYCOSYLATED HEMOGLOBIN (HGB A1C): Hemoglobin A1C: 5.7 % — AB (ref 4.0–5.6)

## 2018-01-13 NOTE — Telephone Encounter (Signed)
Lab results discussed with patient.  See results note from labs drawn on 01/09/2018.

## 2018-01-13 NOTE — Telephone Encounter (Signed)
Best number [phone] Pt would like you to call her regarding her labs

## 2018-01-14 ENCOUNTER — Other Ambulatory Visit: Payer: Self-pay | Admitting: Primary Care

## 2018-01-14 DIAGNOSIS — E785 Hyperlipidemia, unspecified: Secondary | ICD-10-CM

## 2018-01-23 ENCOUNTER — Encounter: Payer: Self-pay | Admitting: *Deleted

## 2018-04-06 ENCOUNTER — Encounter (HOSPITAL_COMMUNITY): Payer: Self-pay

## 2018-04-06 ENCOUNTER — Emergency Department (HOSPITAL_COMMUNITY)
Admission: EM | Admit: 2018-04-06 | Discharge: 2018-04-07 | Disposition: A | Discharge status: Home / Self Care | Payer: 59 | Attending: Emergency Medicine | Admitting: Emergency Medicine

## 2018-04-06 ENCOUNTER — Other Ambulatory Visit: Payer: Self-pay

## 2018-04-06 DIAGNOSIS — M542 Cervicalgia: Secondary | ICD-10-CM | POA: Insufficient documentation

## 2018-04-06 DIAGNOSIS — R202 Paresthesia of skin: Secondary | ICD-10-CM | POA: Diagnosis present

## 2018-04-06 DIAGNOSIS — M47812 Spondylosis without myelopathy or radiculopathy, cervical region: Secondary | ICD-10-CM

## 2018-04-06 DIAGNOSIS — M5382 Other specified dorsopathies, cervical region: Secondary | ICD-10-CM | POA: Diagnosis not present

## 2018-04-06 NOTE — ED Triage Notes (Signed)
Pt here for chest pain that goes to her left arm.  Feels like her left arm is numb.  Had BP taken at home and said it was 184/98.  No blurred vision or neuro deficits.  A&Ox4.  Took 2 of the 324 ASA at the house.

## 2018-04-07 ENCOUNTER — Emergency Department (HOSPITAL_COMMUNITY): Payer: 59

## 2018-04-07 LAB — CBC
HEMATOCRIT: 42.5 % (ref 36.0–46.0)
Hemoglobin: 13.5 g/dL (ref 12.0–15.0)
MCH: 27.6 pg (ref 26.0–34.0)
MCHC: 31.8 g/dL (ref 30.0–36.0)
MCV: 86.9 fL (ref 80.0–100.0)
PLATELETS: 227 10*3/uL (ref 150–400)
RBC: 4.89 MIL/uL (ref 3.87–5.11)
RDW: 12.9 % (ref 11.5–15.5)
WBC: 8.3 10*3/uL (ref 4.0–10.5)
nRBC: 0 % (ref 0.0–0.2)

## 2018-04-07 LAB — BASIC METABOLIC PANEL
ANION GAP: 7 (ref 5–15)
BUN: 13 mg/dL (ref 8–23)
CALCIUM: 9.2 mg/dL (ref 8.9–10.3)
CO2: 27 mmol/L (ref 22–32)
Chloride: 104 mmol/L (ref 98–111)
Creatinine, Ser: 0.72 mg/dL (ref 0.44–1.00)
GLUCOSE: 107 mg/dL — AB (ref 70–99)
Potassium: 3.9 mmol/L (ref 3.5–5.1)
Sodium: 138 mmol/L (ref 135–145)

## 2018-04-07 LAB — I-STAT TROPONIN, ED
TROPONIN I, POC: 0 ng/mL (ref 0.00–0.08)
Troponin i, poc: 0 ng/mL (ref 0.00–0.08)

## 2018-04-07 MED ORDER — IOPAMIDOL (ISOVUE-370) INJECTION 76%
INTRAVENOUS | Status: AC
  Filled 2018-04-07: qty 50

## 2018-04-07 MED ORDER — IOPAMIDOL (ISOVUE-370) INJECTION 76%
50.0000 mL | Freq: Once | INTRAVENOUS | Status: AC | PRN
  Administered 2018-04-07: 50 mL via INTRAVENOUS

## 2018-04-07 NOTE — ED Provider Notes (Signed)
[facility] EMERGENCY DEPARTMENT Provider Note   CSN: [id number] Arrival date & time: 04/06/18  2310     History   Chief Complaint Chief Complaint  Patient presents with  . Neck Pain  . Chest Pain  . Hypertension    HPI Nancy [name] is a 62 y.o. female who presents the emergency department with chief complaint of neck and arm pain.  She states that yesterday she noticed pain and tingling in her occipital region and bilateral neck worse on the left and then had some radiating pain down both arms worse.  She states that she was having the symptoms on and off all day yesterday.  She was attributing it to sleeping in a poor position 2 nights ago when her son was in the hospital after he had a cardiac bypass surgery.  She is a history of previous neck surgery.  She says that sometimes she gets this when she gets stressed out.  She denies headache, changes in vision, arm weakness,.  Her symptoms lasted only about 4-hour window for last normal was last night 10 minutes.  She says her symptoms that she has had the same symptoms in the past and the rest when she has been under stress.  She says that she has been under significant stress with her son's bypass surgery.  The patient becomes tearful during   HPI  Past Medical History:  Diagnosis Date  . Fatigue   . Hyperlipidemia   . Lichen sclerosus   . Routine general medical examination at a health care facility     Patient Active Problem List   Diagnosis Date Noted  . Overweight (BMI 25.0-29.9) 01/09/2018  . Adjustment disorder 02/18/2015  . HLD (hyperlipidemia) 08/25/2010  . FATIGUE 07/14/2010    Past Surgical History:  Procedure Laterality Date  . ANTERIOR CERVICAL DECOMP/DISCECTOMY FUSION    . PARTIAL HYSTERECTOMY    . TUBAL LIGATION       OB History   None      Home Medications    Prior to Admission medications   Medication Sig Start Date End Date Taking? Authorizing Provider  fluticasone  (FLONASE) 50 MCG/ACT nasal spray Place 2 sprays into both nostrils daily. Patient not taking: Reported on 01/09/2018 12/05/17   Jearld Fenton, NP    Family History Family History  Problem Relation Age of Onset  . Hydrocephalus Mother   . Neuropathy Mother   . Diabetes Father   . Hypertension Father   . Thyroid cancer Cousin   . Thyroid disease Maternal Aunt     Social History Social History   Tobacco Use  . Smoking status: Never Smoker  . Smokeless tobacco: Never Used  Substance Use Topics  . Alcohol use: Yes    Alcohol/week: 0.0 standard drinks    Comment: occ  . Drug use: No     Allergies   Statins   Review of Systems Review of Systems Ten systems reviewed and are negative for acute change, except as noted in the HPI.    Physical Exam Updated Vital Signs BP 130/74   Pulse 69   Temp 97.6 F (36.4 C)   Resp 14   SpO2 98%   Physical Exam  Physical Exam  Nursing note and vitals reviewed. Constitutional: She is oriented to person, place, and time. She appears well-developed and well-nourished. No distress.  HENT:  Head: Normocephalic and atraumatic.  Eyes: Conjunctivae normal and EOM are normal. Pupils are equal, round, and reactive  to light. No scleral icterus.  Neck: Normal range of motion.  Cardiovascular: Normal rate, regular rhythm and normal heart sounds.  Exam reveals no gallop and no friction rub.   No murmur heard. Pulmonary/Chest: Effort normal and breath sounds normal. No respiratory distress.  Abdominal: Soft. Bowel sounds are normal. She exhibits no distension and no mass. There is no tenderness. There is no guarding.  Neurological: She is alert and oriented to person, place, and time. Speech is clear and goal oriented, follows commands Major Cranial nerves without deficit, no facial droop Normal strength in upper and lower extremities bilaterally including dorsiflexion and plantar flexion, strong and equal grip strength Sensation normal to  light and sharp touch Moves extremities without ataxia, coordination intact Normal finger to nose and rapid alternating movements Neg romberg, no pronator drift Normal gait Normal heel-shin and balance Skin: Skin is warm and dry. She is not diaphoretic.    ED Treatments / Results  Labs (all labs ordered are listed, but only abnormal results are displayed) Labs Reviewed  BASIC METABOLIC PANEL - Abnormal; Notable for the following components:      Result Value   Glucose, Bld 107 (*)    All other components within normal limits  CBC  I-STAT TROPONIN, ED  I-STAT TROPONIN, ED    EKG EKG Interpretation  Date/Time:  Thursday April 06 2018 23:44:27 EDT Ventricular Rate:  74 PR Interval:  130 QRS Duration: 90 QT Interval:  396 QTC Calculation: 439 R Axis:   88 Text Interpretation:  Normal sinus rhythm Right atrial enlargement Borderline ECG No significant change was found Confirmed by Ezequiel Essex [phone]) on 04/07/2018 5:30:40 AM   Radiology Dg Chest 2 View  Result Date: 04/07/2018 CLINICAL DATA:  Chest pain. EXAM: CHEST - 2 VIEW COMPARISON:  02/23/2016 FINDINGS: The cardiomediastinal contours are normal. The lungs are clear. Pulmonary vasculature is normal. No consolidation, pleural effusion, or pneumothorax. Multiple remote left rib fractures. No acute osseous abnormalities are seen. IMPRESSION: No acute findings. Electronically Signed   By: Keith Rake M.D.   On: 04/07/2018 00:07    Procedures Procedures (including critical care time)  Medications Ordered in ED Medications - No data to display   Initial Impression / Assessment and Plan / ED Course  I have reviewed the triage vital signs and the nursing notes.  Pertinent labs & imaging results that were available during my care of the patient were reviewed by me and considered in my medical decision making (see chart for details).     Patient seen and shared visit with Dr. Wyvonnia Dusky.  The patient has a  long-standing history of degenerative disc disease and previous C-spine surgeries.  She has no neurologic abnormalities.  Given that symptoms today CT NGO was performed to rule out dissection.  There is no evidence of any dissection vertebral artery or significant occlusion.  The patient has no active symptoms at this time, no neurologic deficits and appears appropriate for discharge.  Personally reviewed the patient's labs which shows a mildly elevated glucose however this is likely stress reaction.  Patient's CT angios head and neck are without significant abnormality and I personally reviewed the films.  Patient's EKG shows normal sinus rhythm with no acute ischemic changes or arrhythmias.  Patient appears appropriate for discharge at this time Final Clinical Impressions(s) / ED Diagnoses   Final diagnoses:  Neck pain  Facet joint disease of cervical region    ED Discharge Orders    None  Margarita Mail, PA-C 04/07/18 1641    Ezequiel Essex, MD 04/07/18 [phone]

## 2018-04-07 NOTE — ED Notes (Signed)
Patient transported to CT 

## 2018-04-07 NOTE — Discharge Instructions (Addendum)
Contact a health care provider if: Your pain and other symptoms get worse. Your pain medicine is not helping. Your pain has not improved after a few weeks of home care. You have a fever. Get help right away if: You have severe pain, weakness, or numbness. You have difficulty with bladder or bowel control.

## 2018-04-11 ENCOUNTER — Ambulatory Visit (INDEPENDENT_AMBULATORY_CARE_PROVIDER_SITE_OTHER): Payer: No Typology Code available for payment source | Admitting: Primary Care

## 2018-04-11 ENCOUNTER — Other Ambulatory Visit: Payer: Self-pay | Admitting: Primary Care

## 2018-04-11 ENCOUNTER — Encounter: Payer: Self-pay | Admitting: Primary Care

## 2018-04-11 ENCOUNTER — Other Ambulatory Visit: Payer: Self-pay

## 2018-04-11 VITALS — BP 124/84 | HR 76 | Temp 98.2°F | Ht 63.0 in | Wt 145.5 lb

## 2018-04-11 DIAGNOSIS — E785 Hyperlipidemia, unspecified: Secondary | ICD-10-CM

## 2018-04-11 DIAGNOSIS — F4323 Adjustment disorder with mixed anxiety and depressed mood: Secondary | ICD-10-CM | POA: Diagnosis not present

## 2018-04-11 DIAGNOSIS — Z23 Encounter for immunization: Secondary | ICD-10-CM | POA: Diagnosis not present

## 2018-04-11 DIAGNOSIS — E663 Overweight: Secondary | ICD-10-CM | POA: Diagnosis not present

## 2018-04-11 DIAGNOSIS — G8929 Other chronic pain: Secondary | ICD-10-CM

## 2018-04-11 DIAGNOSIS — M542 Cervicalgia: Secondary | ICD-10-CM

## 2018-04-11 HISTORY — DX: Other chronic pain: G89.29

## 2018-04-11 LAB — LIPID PANEL
CHOL/HDL RATIO: 4
CHOLESTEROL: 264 mg/dL — AB (ref 0–200)
HDL: 61.2 mg/dL (ref >39.00)
LDL CALC: 185 mg/dL — AB (ref 0–99)
NonHDL: 202.88
TRIGLYCERIDES: 90 mg/dL (ref 0.0–149.0)
VLDL: 18 mg/dL (ref 0.0–40.0)

## 2018-04-11 NOTE — Assessment & Plan Note (Signed)
Evaluated at [facility] with neck pain and radiculopathy down left upper extremity. Work up negative including cardiac and neurological. Symptoms were suspected to be aggravated by uncontrolled stress. Doing better now.  Hospital notes, labs, imaging reviewed.

## 2018-04-11 NOTE — Assessment & Plan Note (Signed)
Lipid panel above goal three months ago, repeat lipids pending today. Commended her on weight loss through diet and exercise, encouraged to continue.   We discussed the option of pravastatin and lovastatin, she agrees to a low dose of either if needed. Intolerant to numerous other statins and Zetia.

## 2018-04-11 NOTE — Progress Notes (Signed)
Subjective:    Patient ID: Nancy [name], female    DOB: 04-Jun-1956, 62 y.o.   MRN: [id number]  HPI  Nancy [name] is a 62 year old female who presents today for follow up of hyperlipidemia and Emergency Department follow up.   1) Hyperlipidemia: She was last evaluated in late July 2019, Lipid panel at that time with TC of 291, LDL of 190, HDL of 66. She endorsed that she was intolerant and "scared to death" to statins and had tried numerous medications, also tried Zetia. She reported that she would work on lifestyle changes to reduce cholesterol.   Since her last visit she's cut back on sugar. She was also walking 2 miles four times weekly.   Wt Readings from Last 3 Encounters:  04/11/18 145 lb 8 oz (66 kg)  01/09/18 159 lb 8 oz (72.3 kg)  12/05/17 157 lb (71.2 kg)     The 10-year ASCVD risk score Mikey Bussing DC Jr., et al., 2013) is: 4.9%   Values used to calculate the score:     Age: 66 years     Sex: Female     Is Non-Hispanic African American: No     Diabetic: No     Tobacco smoker: No     Systolic Blood Pressure: 094 mmHg     Is BP treated: No     HDL Cholesterol: 66.1 mg/dL     Total Cholesterol: 291 mg/dL   BP Readings from Last 3 Encounters:  04/11/18 124/84  04/07/18 122/65  01/09/18 122/84   2) Emergency Department Visit: She presented to [facility] on 04/07/18 with complaints of hypertension, left posterior neck pain with tingling down left arm that lasted four hours. She had been under a lot of stress with her son's recent surgery.   She underwent ECG which was unremarkable. She underwent troponin x 2 which were negative. She also underwent CT angio head and neck which were unremarkable. She was discharged home later that day.   Since her discharge home she's been doing better. She denies neck pain and numbness down her left arm.   Review of Systems  Respiratory: Negative for shortness of breath.   Cardiovascular: Negative for chest pain.  Neurological: Negative for  dizziness, numbness and headaches.  Psychiatric/Behavioral:       Recent increased stress with her son's cardiac bypass. Doing better overall.        Past Medical History:  Diagnosis Date  . Fatigue   . Hyperlipidemia   . Lichen sclerosus   . Routine general medical examination at a health care facility      Social History   Socioeconomic History  . Marital status: Single    Spouse name: Not on file  . Number of children: 2  . Years of education: Not on file  . Highest education level: Not on file  Occupational History  . Not on file  Social Needs  . Financial resource strain: Not on file  . Food insecurity:    Worry: Not on file    Inability: Not on file  . Transportation needs:    Medical: Not on file    Non-medical: Not on file  Tobacco Use  . Smoking status: Never Smoker  . Smokeless tobacco: Never Used  Substance and Sexual Activity  . Alcohol use: Yes    Alcohol/week: 0.0 standard drinks    Comment: occ  . Drug use: No  . Sexual activity: Not on file  Lifestyle  . Physical  activity:    Days per week: Not on file    Minutes per session: Not on file  . Stress: Not on file  Relationships  . Social connections:    Talks on phone: Not on file    Gets together: Not on file    Attends religious service: Not on file    Active member of club or organization: Not on file    Attends meetings of clubs or organizations: Not on file    Relationship status: Not on file  . Intimate partner violence:    Fear of current or ex partner: Not on file    Emotionally abused: Not on file    Physically abused: Not on file    Forced sexual activity: Not on file  Other Topics Concern  . Not on file  Social History Narrative   Divorced.  G3P2.   Regular exercise-yes    Past Surgical History:  Procedure Laterality Date  . ANTERIOR CERVICAL DECOMP/DISCECTOMY FUSION    . PARTIAL HYSTERECTOMY    . TUBAL LIGATION      Family History  Problem Relation Age of Onset  .  Hydrocephalus Mother   . Neuropathy Mother   . Diabetes Father   . Hypertension Father   . Thyroid cancer Cousin   . Thyroid disease Maternal Aunt     Allergies  Allergen Reactions  . Statins Other (See Comments)    Myalgias     Current Outpatient Medications on File Prior to Visit  Medication Sig Dispense Refill  . fluticasone (FLONASE) 50 MCG/ACT nasal spray Place 2 sprays into both nostrils daily. (Patient not taking: Reported on 01/09/2018) 16 g 1   No current facility-administered medications on file prior to visit.     BP 124/84   Pulse 76   Temp 98.2 F (36.8 C) (Oral)   Ht 5\' 3"  (1.6 m)   Wt 145 lb 8 oz (66 kg)   SpO2 98%   BMI 25.77 kg/m    Objective:   Physical Exam  Constitutional: She appears well-nourished.  Neck: Neck supple.  Cardiovascular: Normal rate and regular rhythm.  Respiratory: Effort normal and breath sounds normal.  Skin: Skin is warm and dry.  Psychiatric: She has a normal mood and affect.           Assessment & Plan:

## 2018-04-11 NOTE — Addendum Note (Signed)
Addended by: Jacqualin Combes on: 04/11/2018 09:09 AM   Modules accepted: Orders

## 2018-04-11 NOTE — Assessment & Plan Note (Signed)
Commended her on her recent weight loss, encouraged to continue.

## 2018-04-11 NOTE — Assessment & Plan Note (Signed)
Anxiety predominantly. She will work on self calming techniques as she's done before. Offered treatment including therapy and/or medication, she will keep [place] updated if she decides to move towards this.

## 2018-04-11 NOTE — Patient Instructions (Signed)
Stop by the lab prior to leaving today. I will notify you of your results once received.   Continue exercising. You should be getting 150 minutes of moderate intensity exercise weekly.  Continue to work on [employer], congratulations on your weight loss!  It was a pleasure to see you today!

## 2018-06-26 ENCOUNTER — Ambulatory Visit (INDEPENDENT_AMBULATORY_CARE_PROVIDER_SITE_OTHER): Payer: Managed Care, Other (non HMO) | Admitting: Primary Care

## 2018-06-26 ENCOUNTER — Encounter: Payer: Self-pay | Admitting: Primary Care

## 2018-06-26 VITALS — BP 124/78 | HR 77 | Temp 98.0°F | Ht 63.0 in | Wt 145.8 lb

## 2018-06-26 DIAGNOSIS — F411 Generalized anxiety disorder: Secondary | ICD-10-CM | POA: Insufficient documentation

## 2018-06-26 DIAGNOSIS — R102 Pelvic and perineal pain: Secondary | ICD-10-CM | POA: Diagnosis not present

## 2018-06-26 HISTORY — DX: Generalized anxiety disorder: F41.1

## 2018-06-26 LAB — POC URINALSYSI DIPSTICK (AUTOMATED)
BILIRUBIN UA: NEGATIVE
Blood, UA: NEGATIVE
Glucose, UA: NEGATIVE
Ketones, UA: NEGATIVE
NITRITE UA: NEGATIVE
Protein, UA: NEGATIVE
Spec Grav, UA: 1.025 (ref 1.010–1.025)
Urobilinogen, UA: 0.2 E.U./dL
pH, UA: 6 (ref 5.0–8.0)

## 2018-06-26 NOTE — Assessment & Plan Note (Signed)
Prior history of anxiety, once managed on citalopram and was able to wean off in 2017. Overall doing fine now, she will update if her symptoms return.

## 2018-06-26 NOTE — Patient Instructions (Signed)
Schedule an appointment with your gynecologist as discussed.   We will be in touch once we receive your urine culture results.   It was a pleasure to see you today!

## 2018-06-26 NOTE — Progress Notes (Signed)
Subjective:    Patient ID: Nancy [name], female    DOB: [birth year], 63 y.o.   MRN: [id number]  HPI  Nancy [name] is a 63 year old female with a history of UTI who presents today with a chief complaint of pelvic pressure.   Her pressure is located to the left suprapubic region, just to the area of her c-section scar. She describes her pain as a "twinge" that began 2-3 weeks ago. She endorses a lot of stress over the Christmas holiday which has improved. She thought she may have needed to go back on her citalopram but would like to hold off as she's now doing better.   She denies vaginal discharge, vaginal bleeding, vaginal itching, hematuria, urinary frequency, flank pain, fevers, abdominal pain. She has noticed painful intercourse over the last several months, plans on scheduling an appointment with her gynecologist.   Review of Systems  Constitutional: Negative for fever.  Gastrointestinal: Negative for abdominal pain.  Genitourinary: Negative for dysuria, frequency, hematuria, vaginal bleeding and vaginal discharge.       Past Medical History:  Diagnosis Date  . Fatigue   . Hyperlipidemia   . Lichen sclerosus   . Routine general medical examination at a health care facility      Social History   Socioeconomic History  . Marital status: Single    Spouse name: Not on file  . Number of children: 2  . Years of education: Not on file  . Highest education level: Not on file  Occupational History  . Not on file  Social Needs  . Financial resource strain: Not on file  . Food insecurity:    Worry: Not on file    Inability: Not on file  . Transportation needs:    Medical: Not on file    Non-medical: Not on file  Tobacco Use  . Smoking status: Never Smoker  . Smokeless tobacco: Never Used  Substance and Sexual Activity  . Alcohol use: Yes    Alcohol/week: 0.0 standard drinks    Comment: occ  . Drug use: No  . Sexual activity: Not on file  Lifestyle  . Physical  activity:    Days per week: Not on file    Minutes per session: Not on file  . Stress: Not on file  Relationships  . Social connections:    Talks on phone: Not on file    Gets together: Not on file    Attends religious service: Not on file    Active member of club or organization: Not on file    Attends meetings of clubs or organizations: Not on file    Relationship status: Not on file  . Intimate partner violence:    Fear of current or ex partner: Not on file    Emotionally abused: Not on file    Physically abused: Not on file    Forced sexual activity: Not on file  Other Topics Concern  . Not on file  Social History Narrative   Divorced.  G3P2.   Regular exercise-yes    Past Surgical History:  Procedure Laterality Date  . ANTERIOR CERVICAL DECOMP/DISCECTOMY FUSION    . PARTIAL HYSTERECTOMY    . TUBAL LIGATION      Family History  Problem Relation Age of Onset  . Hydrocephalus Mother   . Neuropathy Mother   . Diabetes Father   . Hypertension Father   . Thyroid cancer Cousin   . Thyroid disease Maternal Aunt  Allergies  Allergen Reactions  . Statins Other (See Comments)    Myalgias     No current outpatient medications on file prior to visit.   No current facility-administered medications on file prior to visit.     BP 124/78   Pulse 77   Temp 98 F (36.7 C) (Oral)   Ht 5\' 3"  (1.6 m)   Wt 145 lb 12 oz (66.1 kg)   SpO2 97%   BMI 25.82 kg/m    Objective:   Physical Exam  Constitutional: She appears well-nourished.  Neck: Neck supple.  Cardiovascular: Normal rate and regular rhythm.  Respiratory: Effort normal and breath sounds normal.  GI: Soft. Bowel sounds are normal. There is no abdominal tenderness. There is no CVA tenderness.  Skin: Skin is warm and dry.           Assessment & Plan:  Pelvic Pressure:  Intermittent x 2-3 weeks. Exam today unremarkable, doesn't appear ill or uncomfortable. UA today with trace leuks, negative  nitrites/blood. Culture sent. Recommended pelvic ultrasound and pelvic exam for further evaluation, she will schedule an office visit with her GYN for further evaluation.  Nancy Koch, NP

## 2018-06-27 LAB — URINE CULTURE
MICRO NUMBER:: 46613
Result:: NO GROWTH
SPECIMEN QUALITY:: ADEQUATE

## 2018-06-30 ENCOUNTER — Telehealth: Payer: Self-pay

## 2018-06-30 DIAGNOSIS — F411 Generalized anxiety disorder: Secondary | ICD-10-CM

## 2018-06-30 MED ORDER — CITALOPRAM HYDROBROMIDE 20 MG PO TABS: 20.0000 mg | ORAL_TABLET | Freq: Every day | ORAL | 0 refills | Status: DC

## 2018-06-30 NOTE — Telephone Encounter (Signed)
Spoke with patient, discussed that I will send citalopram 20 mg to her pharmacy. She will start with 1/2 tablet daily for 8 days, then advance to 1 full tablet thereafter. We will call to check on her in 4 weeks.

## 2018-06-30 NOTE — Telephone Encounter (Signed)
Patient calls in to nurse line stating that she would like to go ahead and restart on the Citalopram.  If you could go ahead and send in script to her pharmacy that would be great.    Thanks.

## 2018-07-05 ENCOUNTER — Encounter: Payer: Self-pay | Admitting: Primary Care

## 2018-07-06 ENCOUNTER — Other Ambulatory Visit: Payer: Self-pay | Admitting: Obstetrics & Gynecology

## 2018-07-06 DIAGNOSIS — R928 Other abnormal and inconclusive findings on diagnostic imaging of breast: Secondary | ICD-10-CM

## 2018-07-11 ENCOUNTER — Ambulatory Visit
Admission: RE | Admit: 2018-07-11 | Discharge: 2018-07-11 | Disposition: A | Discharge status: Home / Self Care | Payer: BLUE CROSS/BLUE SHIELD | Source: Ambulatory Visit | Attending: Obstetrics & Gynecology | Admitting: Obstetrics & Gynecology

## 2018-07-11 ENCOUNTER — Other Ambulatory Visit: Payer: Self-pay | Admitting: Obstetrics & Gynecology

## 2018-07-11 DIAGNOSIS — N6310 Unspecified lump in the right breast, unspecified quadrant: Secondary | ICD-10-CM

## 2018-07-11 DIAGNOSIS — R928 Other abnormal and inconclusive findings on diagnostic imaging of breast: Secondary | ICD-10-CM

## 2018-07-13 ENCOUNTER — Other Ambulatory Visit: Payer: Self-pay | Admitting: Obstetrics & Gynecology

## 2018-07-13 ENCOUNTER — Ambulatory Visit
Admission: RE | Admit: 2018-07-13 | Discharge: 2018-07-13 | Disposition: A | Discharge status: Home / Self Care | Payer: BLUE CROSS/BLUE SHIELD | Source: Ambulatory Visit | Attending: Obstetrics & Gynecology | Admitting: Obstetrics & Gynecology

## 2018-07-13 DIAGNOSIS — N6310 Unspecified lump in the right breast, unspecified quadrant: Secondary | ICD-10-CM

## 2018-07-28 ENCOUNTER — Telehealth: Payer: Self-pay | Admitting: Primary Care

## 2018-07-28 NOTE — Telephone Encounter (Addendum)
-----   Message from Pleas Koch, NP sent at 06/30/2018  5:58 PM EST ----- Regarding: Citalopram Will you please call patient? How's she doing since we restarted her citalopram?

## 2018-08-17 NOTE — Telephone Encounter (Signed)
Did you ever see this message?

## 2018-08-18 NOTE — Telephone Encounter (Signed)
Noted  

## 2018-08-18 NOTE — Telephone Encounter (Signed)
No, I did not. I have called patient. She stated that she did take this for a week. Then she wean herself off because of diarrhea.  Patient that feels okay now. She believe she was going over hump in the beginning of the year but overall alright now.  If something comes up again, she will let us know.

## 2018-09-26 ENCOUNTER — Other Ambulatory Visit: Payer: Self-pay | Admitting: Primary Care

## 2018-09-26 DIAGNOSIS — F411 Generalized anxiety disorder: Secondary | ICD-10-CM

## 2019-04-30 ENCOUNTER — Ambulatory Visit (INDEPENDENT_AMBULATORY_CARE_PROVIDER_SITE_OTHER): Payer: BLUE CROSS/BLUE SHIELD | Admitting: Internal Medicine

## 2019-04-30 ENCOUNTER — Encounter: Payer: Self-pay | Admitting: Internal Medicine

## 2019-04-30 ENCOUNTER — Telehealth: Payer: Self-pay

## 2019-04-30 VITALS — Temp 98.8°F

## 2019-04-30 DIAGNOSIS — R112 Nausea with vomiting, unspecified: Secondary | ICD-10-CM | POA: Diagnosis not present

## 2019-04-30 DIAGNOSIS — R197 Diarrhea, unspecified: Secondary | ICD-10-CM

## 2019-04-30 NOTE — Telephone Encounter (Signed)
Pt left v/m that last night pt had diarrhea and vomiting;pt has not vomited today but still watery diarrhea on and off;chills and lower leg muscle cramping; temp 99.5 this AM' and pt wants to know if needs testing. No exposure to + covid virus.

## 2019-04-30 NOTE — Telephone Encounter (Signed)
See office visit encounter. 

## 2019-04-30 NOTE — Progress Notes (Signed)
Virtual Visit via Video Note  I connected with Nancy [name] on 04/30/19 at  4:15 PM EST by a video enabled telemedicine application and verified that I am speaking with the correct person using two identifiers.  Location: Patient: Home Provider: Office   I discussed the limitations of evaluation and management by telemedicine and the availability of in person appointments. The patient expressed understanding and agreed to proceed.  History of Present Illness:  Pt reports nausea, vomiting, diarrhea and chills. She reports this started yesterday. She has not had any vomiting today and her diarrhea is improving. She denies abdominal pain, blood in her vomit or stool. She has not recently taken any antibiotics, no change in diet or medications. No one around her has these symptoms. She denies URI symptoms and has not had known COVID exposure that she is aware of. She does report eating a not fully cooked egg Sunday morning and thinks this could be the culprit, but she wanted to call and see if she should get tested for COVID anyway.    Past Medical History:  Diagnosis Date  . Fatigue   . Hyperlipidemia   . Lichen sclerosus   . Routine general medical examination at a health care facility     Current Outpatient Medications  Medication Sig Dispense Refill  . cholecalciferol (VITAMIN D3) 25 MCG (1000 UT) tablet Take 1,000 Units by mouth daily.    . diphenhydrAMINE HCl (BENADRYL ALLERGY PO) Take by mouth.     No current facility-administered medications for this visit.     Allergies  Allergen Reactions  . Statins Other (See Comments)    Myalgias     Family History  Problem Relation Age of Onset  . Hydrocephalus Mother   . Neuropathy Mother   . Diabetes Father   . Hypertension Father   . Thyroid cancer Cousin   . Thyroid disease Maternal Aunt     Social History   Socioeconomic History  . Marital status: Single    Spouse name: Not on file  . Number of children: 2  .  Years of education: Not on file  . Highest education level: Not on file  Occupational History  . Not on file  Social Needs  . Financial resource strain: Not on file  . Food insecurity    Worry: Not on file    Inability: Not on file  . Transportation needs    Medical: Not on file    Non-medical: Not on file  Tobacco Use  . Smoking status: Never Smoker  . Smokeless tobacco: Never Used  Substance and Sexual Activity  . Alcohol use: Yes    Alcohol/week: 0.0 standard drinks    Comment: occ  . Drug use: No  . Sexual activity: Not on file  Lifestyle  . Physical activity    Days per week: Not on file    Minutes per session: Not on file  . Stress: Not on file  Relationships  . Social Herbalist on phone: Not on file    Gets together: Not on file    Attends religious service: Not on file    Active member of club or organization: Not on file    Attends meetings of clubs or organizations: Not on file    Relationship status: Not on file  . Intimate partner violence    Fear of current or ex partner: Not on file    Emotionally abused: Not on file    Physically abused:  Not on file    Forced sexual activity: Not on file  Other Topics Concern  . Not on file  Social History Narrative   Divorced.  G3P2.   Regular exercise-yes     Constitutional: Denies fever, malaise, fatigue, headache or abrupt weight changes.  HEENT: Denies eye pain, eye redness, ear pain, ringing in the ears, wax buildup, runny nose, nasal congestion, bloody nose, or sore throat. Respiratory: Denies difficulty breathing, shortness of breath, cough or sputum production.   Cardiovascular: Denies chest pain, chest tightness, palpitations or swelling in the hands or feet.  Gastrointestinal: Pt reports nausea, vomiting and diarrhea. Denies abdominal pain, bloating, constipation, or blood in the stool.    No other specific complaints in a complete review of systems (except as listed in HPI above).    Observations/Objective:  Temp 98.8 F (37.1 C) (Oral)  Wt Readings from Last 3 Encounters:  06/26/18 145 lb 12 oz (66.1 kg)  04/11/18 145 lb 8 oz (66 kg)  01/09/18 159 lb 8 oz (72.3 kg)    General: Appears her stated age, well developed, well nourished in NAD. Pulmonary/Chest: Normal effort. No respiratory distress.  Neurological: Alert and oriented.   BMET    Component Value Date/Time   NA 138 04/06/2018 2352   K 3.9 04/06/2018 2352   CL 104 04/06/2018 2352   CO2 27 04/06/2018 2352   GLUCOSE 107 (H) 04/06/2018 2352   BUN 13 04/06/2018 2352   CREATININE 0.72 04/06/2018 2352   CALCIUM 9.2 04/06/2018 2352   GFRNONAA >60 04/06/2018 2352   GFRAA >60 04/06/2018 2352    Lipid Panel     Component Value Date/Time   CHOL 264 (H) 04/11/2018 0841   TRIG 90.0 04/11/2018 0841   HDL 61.20 04/11/2018 0841   CHOLHDL 4 04/11/2018 0841   VLDL 18.0 04/11/2018 0841   LDLCALC 185 (H) 04/11/2018 0841    CBC    Component Value Date/Time   WBC 8.3 04/06/2018 2352   RBC 4.89 04/06/2018 2352   HGB 13.5 04/06/2018 2352   HCT 42.5 04/06/2018 2352   PLT 227 04/06/2018 2352   MCV 86.9 04/06/2018 2352   MCH 27.6 04/06/2018 2352   MCHC 31.8 04/06/2018 2352   RDW 12.9 04/06/2018 2352   LYMPHSABS 2.6 01/14/2014 1201   MONOABS 0.4 01/14/2014 1201   EOSABS 0.2 01/14/2014 1201   BASOSABS 0.0 01/14/2014 1201    Hgb A1C Lab Results  Component Value Date   HGBA1C 5.7 (A) 01/13/2018       Assessment and Plan:  Nausea, Vomiting, Diarrhea:  Likely due to consuming undercooked food Already improving Encouraged adequate water intake Consume a bland diet, advance as tolerated No need for COVID at this time Continue to monitor symptoms for now  Return precautions discussed  Follow Up Instructions:    I discussed the assessment and treatment plan with the patient. The patient was provided an opportunity to ask questions and all were answered. The patient agreed with the plan and  demonstrated an understanding of the instructions.   The patient was advised to call back or seek an in-person evaluation if the symptoms worsen or if the condition fails to improve as anticipated.     Webb Silversmith, NP

## 2019-04-30 NOTE — Patient Instructions (Signed)
Bland Diet A bland diet consists of foods that are often soft and do not have a lot of fat, fiber, or extra seasonings. Foods without fat, fiber, or seasoning are easier for the body to digest. They are also less likely to irritate your mouth, throat, stomach, and other parts of your digestive system. A bland diet is sometimes called a BRAT diet. What is my plan? Your health care provider or food and nutrition specialist (dietitian) may recommend specific changes to your diet to prevent symptoms or to treat your symptoms. These changes may include:  Eating small meals often.  Cooking food until it is soft enough to chew easily.  Chewing your food well.  Drinking fluids slowly.  Not eating foods that are very spicy, sour, or fatty.  Not eating citrus fruits, such as oranges and grapefruit. What do I need to know about this diet?  Eat a variety of foods from the bland diet food list.  Do not follow a bland diet longer than needed.  Ask your health care provider whether you should take vitamins or supplements. What foods can I eat? Grains  Hot cereals, such as cream of wheat. Rice. Bread, crackers, or tortillas made from refined white flour. Vegetables Canned or cooked vegetables. Mashed or boiled potatoes. Fruits  Bananas. Applesauce. Other types of cooked or canned fruit with the skin and seeds removed, such as canned peaches or pears. Meats and other proteins  Scrambled eggs. Creamy peanut butter or other nut butters. Lean, well-cooked meats, such as chicken or fish. Tofu. Soups or broths. Dairy Low-fat dairy products, such as milk, cottage cheese, or yogurt. Beverages  Water. Herbal tea. Apple juice. Fats and oils Mild salad dressings. Canola or olive oil. Sweets and desserts Pudding. Custard. Fruit gelatin. Ice cream. The items listed above may not be a complete list of recommended foods and beverages. Contact a dietitian for more options. What foods are not  recommended? Grains Whole grain breads and cereals. Vegetables Raw vegetables. Fruits Raw fruits, especially citrus, berries, or dried fruits. Dairy Whole fat dairy foods. Beverages Caffeinated drinks. Alcohol. Seasonings and condiments Strongly flavored seasonings or condiments. Hot sauce. Salsa. Other foods Spicy foods. Fried foods. Sour foods, such as pickled or fermented foods. Foods with high sugar content. Foods high in fiber. The items listed above may not be a complete list of foods and beverages to avoid. Contact a dietitian for more information. Summary  A bland diet consists of foods that are often soft and do not have a lot of fat, fiber, or extra seasonings.  Foods without fat, fiber, or seasoning are easier for the body to digest.  Check with your health care provider to see how long you should follow this diet plan. It is not meant to be followed for long periods. This information is not intended to replace advice given to you by your health care provider. Make sure you discuss any questions you have with your health care provider. Document Released: 09/22/2015 Document Revised: 06/29/2017 Document Reviewed: 06/29/2017 Elsevier Patient Education  2020 [employer] Inc[employer]  

## 2019-06-06 ENCOUNTER — Other Ambulatory Visit: Payer: Self-pay

## 2019-06-06 ENCOUNTER — Encounter: Payer: Self-pay | Admitting: Primary Care

## 2019-06-06 ENCOUNTER — Ambulatory Visit (INDEPENDENT_AMBULATORY_CARE_PROVIDER_SITE_OTHER): Payer: BLUE CROSS/BLUE SHIELD | Admitting: Primary Care

## 2019-06-06 VITALS — BP 130/84 | HR 80 | Temp 97.1°F | Ht 63.0 in | Wt 156.0 lb

## 2019-06-06 DIAGNOSIS — N898 Other specified noninflammatory disorders of vagina: Secondary | ICD-10-CM | POA: Insufficient documentation

## 2019-06-06 DIAGNOSIS — R350 Frequency of micturition: Secondary | ICD-10-CM

## 2019-06-06 DIAGNOSIS — L900 Lichen sclerosus et atrophicus: Secondary | ICD-10-CM | POA: Insufficient documentation

## 2019-06-06 DIAGNOSIS — R8290 Unspecified abnormal findings in urine: Secondary | ICD-10-CM | POA: Insufficient documentation

## 2019-06-06 HISTORY — DX: Frequency of micturition: R35.0

## 2019-06-06 HISTORY — DX: Other specified noninflammatory disorders of vagina: N89.8

## 2019-06-06 LAB — POC URINALSYSI DIPSTICK (AUTOMATED)
Bilirubin, UA: NEGATIVE
Glucose, UA: NEGATIVE
Ketones, UA: NEGATIVE
Nitrite, UA: NEGATIVE
Protein, UA: POSITIVE — AB
Spec Grav, UA: >=1.03 — AB (ref 1.010–1.025)
Urobilinogen, UA: 0.2 E.U./dL
pH, UA: 5 (ref 5.0–8.0)

## 2019-06-06 NOTE — Progress Notes (Signed)
   Subjective:    Patient ID: Nancy [name], female    DOB: 13-Oct-1955, 63 y.o.   MRN: [id number]  HPI  Mrs. Freund is a 63 year old female with a history of hyperlipidemia, chronic neck pain and generalized anxiety disorder who presents today with a chief complaint of urinary frequency and dysuria.   Her symptoms include urinary urgency, mild suprapubic pressure and mild vaginal irritation/itching that began began 4-5 days ago after using an OTC vaginal moisturizer. She thinks her symptoms of urgency and frequency are worse today. She has not taken anything for her symptoms. She denies hematuria, vaginal burning, vaginal discharge, suprapubic pain, back pain, fevers or chills.   Review of Systems  Constitutional: Negative for chills and fever.  Genitourinary: Positive for dysuria, frequency and urgency. Negative for difficulty urinating, hematuria, pelvic pain, vaginal discharge and vaginal pain.       See HPI       Objective:   Physical Exam Exam conducted with a chaperone present.  Cardiovascular:     Rate and Rhythm: Normal rate and regular rhythm.     Heart sounds: Normal heart sounds.  Pulmonary:     Breath sounds: Normal breath sounds.  Abdominal:     Palpations: Abdomen is soft.     Tenderness: There is no abdominal tenderness. There is no right CVA tenderness or left CVA tenderness.  Genitourinary:    General: Normal vulva.     Vagina: Normal.     Cervix: Normal.  Neurological:     Mental Status: She is alert and oriented to person, place, and time.           Assessment & Plan:

## 2019-06-06 NOTE — Assessment & Plan Note (Addendum)
Acute x 4-5 days with vaginal itching. UA today with trace leuk, protein, 1+ blood. Urine culture pending No alarm symptoms on exam.   Given cocurrent vaginal and urinary symptoms, will obtain wet prep today. If negative, will initiate antibiotics for UTI. If positive, will initiate Diflucan and await urine culture results.    UA today with trace leuk, protein, 1+ blood. Urine culture pending.   Given cocurrent vaginal symptoms, will obtain wet prep today. If negative, will initiate antibiotics for UTI. If positive, will initiate Diflucan and await urine culture results.   Follow-up pending wet prep/urine culture results.   Agree with plan, Pleas Koch, NP

## 2019-06-06 NOTE — Progress Notes (Signed)
Subjective:    Patient ID: Nancy [name], female    DOB: 1955/10/11, 63 y.o.   MRN: [id number]  HPI  Nancy [name] is a 63 year old female with a history of acute cystitis, anxiety disorder, hyperlipidemia who presents today with a chief complaint of urinary frequency.  She also reports dysuria, vaginal irritation, pelvic pressure, urinary urgency. Symptoms began 4-5 days ago after using OTC vaginal moisturizer. She's not taken anything OTC for symptoms.   She denies fevers, abdominal pain, nausea, vaginal bleeding.   Review of Systems  Constitutional: Negative for fever.  Gastrointestinal: Negative for abdominal pain and nausea.  Genitourinary: Positive for dysuria, frequency and pelvic pain. Negative for hematuria and vaginal discharge.       Vaginal itching       Past Medical History:  Diagnosis Date  . Fatigue   . Hyperlipidemia   . Lichen sclerosus   . Routine general medical examination at a health care facility      Social History   Socioeconomic History  . Marital status: Single    Spouse name: Not on file  . Number of children: 2  . Years of education: Not on file  . Highest education level: Not on file  Occupational History  . Not on file  Tobacco Use  . Smoking status: Never Smoker  . Smokeless tobacco: Never Used  Substance and Sexual Activity  . Alcohol use: Yes    Alcohol/week: 0.0 standard drinks    Comment: occ  . Drug use: No  . Sexual activity: Not on file  Other Topics Concern  . Not on file  Social History Narrative   Divorced.  G3P2.   Regular exercise-yes   Social Determinants of Health   Financial Resource Strain:   . Difficulty of Paying Living Expenses: Not on file  Food Insecurity:   . Worried About Charity fundraiser in the Last Year: Not on file  . Ran Out of Food in the Last Year: Not on file  Transportation Needs:   . Lack of Transportation (Medical): Not on file  . Lack of Transportation (Non-Medical): Not on file   Physical Activity:   . Days of Exercise per Week: Not on file  . Minutes of Exercise per Session: Not on file  Stress:   . Feeling of Stress : Not on file  Social Connections:   . Frequency of Communication with Friends and Family: Not on file  . Frequency of Social Gatherings with Friends and Family: Not on file  . Attends Religious Services: Not on file  . Active Member of Clubs or Organizations: Not on file  . Attends Archivist Meetings: Not on file  . Marital Status: Not on file  Intimate Partner Violence:   . Fear of Current or Ex-Partner: Not on file  . Emotionally Abused: Not on file  . Physically Abused: Not on file  . Sexually Abused: Not on file    Past Surgical History:  Procedure Laterality Date  . ANTERIOR CERVICAL DECOMP/DISCECTOMY FUSION    . PARTIAL HYSTERECTOMY    . TUBAL LIGATION      Family History  Problem Relation Age of Onset  . Hydrocephalus Mother   . Neuropathy Mother   . Diabetes Father   . Hypertension Father   . Thyroid cancer Cousin   . Thyroid disease Maternal Aunt     Allergies  Allergen Reactions  . Statins Other (See Comments)    Myalgias  Current Outpatient Medications on File Prior to Visit  Medication Sig Dispense Refill  . cholecalciferol (VITAMIN D3) 25 MCG (1000 UT) tablet Take 1,000 Units by mouth daily.    . diphenhydrAMINE HCl (BENADRYL ALLERGY PO) Take by mouth.     No current facility-administered medications on file prior to visit.    BP 130/84   Pulse 80   Temp (!) 97.1 F (36.2 C) (Temporal)   Ht 5\' 3"  (1.6 m)   Wt 156 lb (70.8 kg)   SpO2 98%   BMI 27.63 kg/m    Objective:   Physical Exam  Constitutional: She appears well-nourished.  Cardiovascular: Normal rate.  Respiratory: Effort normal.  Genitourinary: There is no lesion on the right labia. There is no lesion on the left labia. Cervix exhibits no discharge. Right adnexum displays no tenderness. Left adnexum displays no tenderness.    No  vaginal discharge.            Assessment & Plan:

## 2019-06-06 NOTE — Patient Instructions (Signed)
I will be in touch regarding your lab results tomorrow.   It was a pleasure to see you today!

## 2019-06-06 NOTE — Assessment & Plan Note (Addendum)
Acute x 4-5 days with dysuria and urinary frequency. UA today with trace leuk, protein, 1+ blood. Urine culture pending. Vaginal exam today unremarkable.   Given cocurrent vaginal and urinary symptoms, will obtain wet prep today. If negative, will initiate antibiotics for UTI. If positive, will initiate Diflucan and await urine culture results.   Follow-up pending wet prep/urine culture results.   Agree with plan, Pleas Koch, NP

## 2019-06-07 LAB — WET PREP BY MOLECULAR PROBE
Candida species: NOT DETECTED
MICRO NUMBER:: 1226802
SPECIMEN QUALITY:: ADEQUATE
Trichomonas vaginosis: NOT DETECTED

## 2019-06-07 LAB — URINE CULTURE
MICRO NUMBER:: 1226785
SPECIMEN QUALITY:: ADEQUATE

## 2019-06-08 ENCOUNTER — Other Ambulatory Visit: Payer: Self-pay | Admitting: Primary Care

## 2019-06-08 DIAGNOSIS — N952 Postmenopausal atrophic vaginitis: Secondary | ICD-10-CM

## 2019-06-08 MED ORDER — ESTRADIOL 0.1 MG/GM VA CREA: 1.0000 g | TOPICAL_CREAM | VAGINAL | 0 refills | Status: DC

## 2019-06-11 ENCOUNTER — Telehealth: Payer: Self-pay | Admitting: *Deleted

## 2019-06-11 ENCOUNTER — Other Ambulatory Visit: Payer: Self-pay | Admitting: Family Medicine

## 2019-06-11 DIAGNOSIS — B9689 Other specified bacterial agents as the cause of diseases classified elsewhere: Secondary | ICD-10-CM

## 2019-06-11 DIAGNOSIS — N760 Acute vaginitis: Secondary | ICD-10-CM

## 2019-06-11 MED ORDER — METRONIDAZOLE 0.75 % VA GEL: 1.0000 | Freq: Every day | VAGINAL | 0 refills | Status: DC

## 2019-06-11 NOTE — Telephone Encounter (Signed)
Please call patient let her know that I have sent in a nightly vaginal medication for her to use for 7 nights.  This is to treat bacterial vaginitis.  Is important to not drink alcohol while using her for 24 hours after completing medication.  Anda Kraft had sent in some estrogen cream that she can start once she finishes the vaginal metronidazole.  If symptoms persist following treatment, please follow-up.

## 2019-06-11 NOTE — Telephone Encounter (Signed)
Pt aware of recommendations. She will call if sx's persist.   Nothing further needed.

## 2019-06-11 NOTE — Telephone Encounter (Signed)
Patient called stating that she was seen last week because she thought that she has a UTI. Patient stated that Allie Bossier NP called her Christmas day to give her the test results. Patient stated that she was told that her results showed a possible vaginal infection. Patient stated that she thought that she was getting better, but symptoms have not improved. Patient stated that she and Anda Kraft decided to hold off on treatment of the possible infection to see if she got okay on her own. Patient stated that she continues to have vaginal itching and would like a prescription sent to he pharmacy for her symptoms. Pharmacy [facility] [address] Allie Bossier NP is out of the office until Thursday.

## 2019-07-19 ENCOUNTER — Encounter (HOSPITAL_COMMUNITY): Payer: Self-pay | Admitting: Emergency Medicine

## 2019-07-19 ENCOUNTER — Other Ambulatory Visit: Payer: Self-pay

## 2019-07-19 ENCOUNTER — Emergency Department (HOSPITAL_COMMUNITY)
Admission: EM | Admit: 2019-07-19 | Discharge: 2019-07-20 | Disposition: A | Discharge status: Home / Self Care | Payer: BLUE CROSS/BLUE SHIELD | Attending: Emergency Medicine | Admitting: Emergency Medicine

## 2019-07-19 ENCOUNTER — Telehealth: Payer: Self-pay | Admitting: *Deleted

## 2019-07-19 DIAGNOSIS — R202 Paresthesia of skin: Secondary | ICD-10-CM | POA: Insufficient documentation

## 2019-07-19 DIAGNOSIS — Z20822 Contact with and (suspected) exposure to covid-19: Secondary | ICD-10-CM | POA: Insufficient documentation

## 2019-07-19 DIAGNOSIS — Z79899 Other long term (current) drug therapy: Secondary | ICD-10-CM | POA: Insufficient documentation

## 2019-07-19 DIAGNOSIS — R030 Elevated blood-pressure reading, without diagnosis of hypertension: Secondary | ICD-10-CM | POA: Insufficient documentation

## 2019-07-19 DIAGNOSIS — H538 Other visual disturbances: Secondary | ICD-10-CM | POA: Diagnosis not present

## 2019-07-19 LAB — CBC
HCT: 41.6 % (ref 36.0–46.0)
Hemoglobin: 13.3 g/dL (ref 12.0–15.0)
MCH: 28.4 pg (ref 26.0–34.0)
MCHC: 32 g/dL (ref 30.0–36.0)
MCV: 88.7 fL (ref 80.0–100.0)
Platelets: 255 10*3/uL (ref 150–400)
RBC: 4.69 MIL/uL (ref 3.87–5.11)
RDW: 12.7 % (ref 11.5–15.5)
WBC: 8.3 10*3/uL (ref 4.0–10.5)
nRBC: 0 % (ref 0.0–0.2)

## 2019-07-19 LAB — BASIC METABOLIC PANEL
Anion gap: 9 (ref 5–15)
BUN: 12 mg/dL (ref 8–23)
CO2: 26 mmol/L (ref 22–32)
Calcium: 9 mg/dL (ref 8.9–10.3)
Chloride: 104 mmol/L (ref 98–111)
Creatinine, Ser: 0.88 mg/dL (ref 0.44–1.00)
GFR calc Af Amer: >60 mL/min (ref >60)
GFR calc non Af Amer: >60 mL/min (ref >60)
Glucose, Bld: 113 mg/dL — ABNORMAL HIGH (ref 70–99)
Potassium: 3.9 mmol/L (ref 3.5–5.1)
Sodium: 139 mmol/L (ref 135–145)

## 2019-07-19 LAB — TROPONIN I (HIGH SENSITIVITY)
Troponin I (High Sensitivity): 8 ng/L (ref <18)
Troponin I (High Sensitivity): 8 ng/L (ref <18)

## 2019-07-19 MED ORDER — SODIUM CHLORIDE 0.9% FLUSH: 3.0000 mL | Freq: Once | INTRAVENOUS | Status: DC

## 2019-07-19 MED ORDER — METOPROLOL TARTRATE 5 MG/5ML IV SOLN
5.0000 mg | Freq: Once | INTRAVENOUS | Status: DC
  Filled 2019-07-19: qty 5

## 2019-07-19 NOTE — ED Triage Notes (Signed)
Pt arrives to ED from home with complaints of hypertension and anxiety. Patient states that last night the left side of her face was tingling and her vision was blurry. Patient was not able to fall asleep until 4am. This morning patient felt normal but her face started to tingle again so she took her blood pressure which was 206/103. Patient has history of anxiety which she states she has been really anxious lately. Patient has no neuro deficients at this time.

## 2019-07-19 NOTE — Telephone Encounter (Signed)
Pt called Triage earlier this afternoon saying she felt "off" and her BP was 206/103, I transferred that call to Access Nurse line.   Pt called back later this afternoon and said that the access nurse line told her to go to hospital. Pt did call 911, EMS came out and triage her, her 1st BP was 188/90 p 90 and O2 was 99, they waited 15-20 min and rechecked and her BP was 160/92 p 80 and O2 98, her neuro was stable/negative. EMS recommended since she was stable to call her PCP 1st to see if she still needs to go to ER or can she just come in the office for an appt because she did advise EMS she also has anxiety issues and she isn't on any meds. Since PCP is out of the office I asked Nancy Silversmith, NP her recommendations. Nancy [name] said as long as pt's BP is not over 200/100 and everything else is stable then we can schedule appt with PCP tomorrow. Pt advised of Nancy Silversmith, NP comments and appt with PCP scheduled tomorrow at 8:40am and ER precautions given.   FYI to PCP

## 2019-07-19 NOTE — ED Provider Notes (Signed)
Medical screening examination/treatment/procedure(s) were conducted as a shared visit with non-physician practitioner(s) and myself.  I personally evaluated the patient during the encounter.  EKG Interpretation  Date/Time:  Thursday July 19 2019 17:42:14 EST Ventricular Rate:  99 PR Interval:  128 QRS Duration: 74 QT Interval:  364 QTC Calculation: 467 R Axis:   79 Text Interpretation: Normal sinus rhythm Nonspecific ST abnormality Abnormal ECG agree. subtle ST depression on previous study with slight increase, likely rate related. Confirmed by Charlesetta Shanks [phone]) on 07/19/2019 8:24:07 PM  Patient does not have prior history of hypertension.  She reports maximum blood pressure she is ever recorded is 130/80.  That was at the beginning of January. This evening she started getting symptoms of blurred vision and some tingling on the side of her face.  Symptoms were waxing and waning and she ultimately got a blood pressure machine to check it.  She got pressures of greater than 200/113.  EMS to come out on her doctor's recommendation and initial pressures were elevated to the 190s but with recheck they were able to documented down to 160.  She determined to wait before coming to the emergency department but while reading she again noticed some blurring in her vision and recheck the blood pressure and it was up to 100 again.  Patient is alert and appropriate.  Mental status clear.  Speech is clear.  Renal nerves II through XII intact.  Heart regular.  Lungs clear.  Movements coordinated purposeful symmetric.  Patient typically does not have hypertension.  Pressures were up greater than 200 this evening.  He has some waxing and waning blurred vision.  At this time plan for initiating blood pressure control with a dose of Lopressor.  Plan for admission for hypertensive urgency with associated symptoms.  Patient however is not encephalopathic.  Mental status is clear.   Charlesetta Shanks,  MD 07/19/19 2218

## 2019-07-19 NOTE — Telephone Encounter (Signed)
Agree, she does not need ED evaluation as long as BP <200/100.

## 2019-07-19 NOTE — ED Provider Notes (Signed)
[facility] EMERGENCY DEPARTMENT Provider Note   CSN: [id number] Arrival date & time: 07/19/19  1718     History Chief Complaint  Patient presents with  . Hypertension    Nancy [name] is a 64 y.o. female with past medical history significant for T, hyperlipidemia, lichen sclerosis presenting to emergency room today with chief complaint of hypertension x 1 day. Patient starts her symptoms started last night.  She was watching TV when she started to have left-sided facial tingling that radiated to the left side of her head.  She states this is a typical symptom of her anxiety. She is not currently on any anxiety medications but has taken them in the past.  She also noticed she had a hard time focusing her eyes while her face was tingling. She went to bed but had a hard time falling asleep because she was feeling so anxious. Today she bought a blood pressure cuff from the store and had reading of 203/103, she called pcp recommended patient call EMS.  She denies any diaphoresis, chest pain, shortness of breath, abdominal pain, nausea, vomiting, pain in jaw or arms, back pain. She did not take any medications for her symptoms [name] to arrival.  Patient called EMS as instructed and EMS checked blood pressure and found it to be 188/75, patient declined hospital transport and scheduled a follow-up appoint with her PCP tomorrow morning.  However she checked her blood pressure again and it was found to be [id number] systolic which prompted her to come to the emergency department for further evaluation as she was still symptomatic.  Of note patient did say she had a Covid exposure last week.  Her granddaughter tested positive.  Other family members were tested and were all negative. Patient was never tested.  She denies any respiratory symptoms.  Past Medical History:  Diagnosis Date  . Fatigue   . Hyperlipidemia   . Lichen sclerosus   . Routine general medical examination at a health  care facility     Patient Active Problem List   Diagnosis Date Noted  . Vaginal itching 06/06/2019  . Urinary frequency 06/06/2019  . GAD (generalized anxiety disorder) 06/26/2018  . Chronic neck pain 04/11/2018  . Overweight (BMI 25.0-29.9) 01/09/2018  . HLD (hyperlipidemia) 08/25/2010    Past Surgical History:  Procedure Laterality Date  . ANTERIOR CERVICAL DECOMP/DISCECTOMY FUSION    . PARTIAL HYSTERECTOMY    . TUBAL LIGATION       OB History   No obstetric history on file.     Family History  Problem Relation Age of Onset  . Hydrocephalus Mother   . Neuropathy Mother   . Diabetes Father   . Hypertension Father   . Thyroid cancer Cousin   . Thyroid disease Maternal Aunt     Social History   Tobacco Use  . Smoking status: Never Smoker  . Smokeless tobacco: Never Used  Substance Use Topics  . Alcohol use: Yes    Alcohol/week: 0.0 standard drinks    Comment: occ  . Drug use: No    Home Medications [name] to Admission medications   Medication Sig Start Date End Date Taking? Authorizing Provider  cholecalciferol (VITAMIN D3) 25 MCG (1000 UT) tablet Take 1,000 Units by mouth daily.   Yes [provider]  diphenhydrAMINE HCl (BENADRYL ALLERGY PO) Take 2 capsules by mouth 2 (two) times daily as needed (For sinus congestion).    Yes [provider]  loratadine (CLARITIN) 10 MG  tablet Take 10 mg by mouth daily as needed for allergies.   Yes [provider]  amLODipine (NORVASC) 5 MG tablet Take 1 tablet (5 mg total) by mouth daily. 07/20/19 08/19/19  Jamea Robicheaux E, PA-C  estradiol (ESTRACE) 0.1 MG/GM vaginal cream Place 1 g vaginally 3 (three) times a week. Patient not taking: Reported on 07/19/2019 06/08/19 06/07/20  Pleas Koch, NP  metroNIDAZOLE (METROGEL VAGINAL) 0.75 % vaginal gel Place 1 Applicatorful vaginally at bedtime. For 7 days. Do not drink alcohol while using or for 24 hours after completing. Patient not taking: Reported  on 07/19/2019 06/11/19   Elby Beck, FNP    Allergies    Statins  Review of Systems   Review of Systems  All other systems are reviewed and are negative for acute change except as noted in the HPI.   Physical Exam Updated Vital Signs BP (!) 187/98 (BP Location: Left Arm)   Pulse (!) 106   Temp 98.2 F (36.8 C) (Oral)   Resp 17   SpO2 100%   Physical Exam Vitals and nursing note reviewed.  Constitutional:      General: She is not in acute distress.    Appearance: She is not ill-appearing.  HENT:     Head: Normocephalic and atraumatic.     Right Ear: Tympanic membrane and external ear normal.     Left Ear: Tympanic membrane and external ear normal.     Nose: Nose normal.     Mouth/Throat:     Mouth: Mucous membranes are moist.     Pharynx: Oropharynx is clear.  Eyes:     General: No scleral icterus.       Right eye: No discharge.        Left eye: No discharge.     Extraocular Movements: Extraocular movements intact.     Conjunctiva/sclera: Conjunctivae normal.     Pupils: Pupils are equal, round, and reactive to light.  Neck:     Vascular: No JVD.  Cardiovascular:     Rate and Rhythm: Normal rate and regular rhythm.     Pulses: Normal pulses.          Radial pulses are 2+ on the right side and 2+ on the left side.       Dorsalis pedis pulses are 2+ on the right side and 2+ on the left side.     Heart sounds: Normal heart sounds.     Comments: Heart rate ranging from 85-94 during exam Pulmonary:     Comments: Lungs clear to auscultation in all fields. Symmetric chest rise. No wheezing, rales, or rhonchi. Abdominal:     Comments: Abdomen is soft, non-distended, and non-tender in all quadrants. No rigidity, no guarding. No peritoneal signs.  Musculoskeletal:        General: Normal range of motion.     Cervical back: Normal range of motion.     Right lower leg: No edema.     Left lower leg: No edema.  Skin:    General: Skin is warm and dry.     Capillary  Refill: Capillary refill takes less than 2 seconds.  Neurological:     Mental Status: She is oriented to person, place, and time.     GCS: GCS eye subscore is 4. GCS verbal subscore is 5. GCS motor subscore is 6.     Comments: Fluent speech, no facial droop.  Speech is clear and goal oriented, follows commands CN III-XII intact, no facial  droop Normal strength in upper and lower extremities bilaterally including dorsiflexion and plantar flexion, strong and equal grip strength Sensation normal to light and sharp touch Moves extremities without ataxia, coordination intact Normal finger to nose and rapid alternating movements Normal gait and balance   Psychiatric:        Mood and Affect: Mood is anxious.        Behavior: Behavior normal.       ED Results / Procedures / Treatments   Labs (all labs ordered are listed, but only abnormal results are displayed) Labs Reviewed  BASIC METABOLIC PANEL - Abnormal; Notable for the following components:      Result Value   Glucose, Bld 113 (*)    All other components within normal limits  SARS CORONAVIRUS 2 (TAT 6-24 HRS)  CBC  CBG MONITORING, ED  TROPONIN I (HIGH SENSITIVITY)  TROPONIN I (HIGH SENSITIVITY)    EKG EKG Interpretation  Date/Time:  Thursday July 19 2019 17:42:14 EST Ventricular Rate:  99 PR Interval:  128 QRS Duration: 74 QT Interval:  364 QTC Calculation: 467 R Axis:   79 Text Interpretation: Normal sinus rhythm Nonspecific ST abnormality Abnormal ECG agree. subtle ST depression on previous study with slight increase, likely rate related. Confirmed by Charlesetta Shanks [phone]) on 07/19/2019 8:24:07 PM   Radiology MR Brain W and Wo Contrast  Result Date: 07/20/2019 CLINICAL DATA:  Blurry vision and hypertension. Facial tingling. EXAM: MRI HEAD WITHOUT AND WITH CONTRAST TECHNIQUE: Multiplanar, multiecho pulse sequences of the brain and surrounding structures were obtained without and with intravenous contrast.  CONTRAST:  55mL GADAVIST GADOBUTROL 1 MMOL/ML IV SOLN COMPARISON:  None. FINDINGS: BRAIN: No acute infarct, acute hemorrhage or extra-axial collection. Normal white matter signal for age. Normal volume of brain parenchyma and CSF spaces. Midline structures are normal. VASCULAR: Major flow voids are preserved. Susceptibility-sensitive sequences show no chronic microhemorrhage or superficial siderosis. SKULL AND UPPER CERVICAL SPINE: Normal calvarium and skull base. Visualized upper cervical spine and soft tissues are normal. SINUSES/ORBITS: No fluid levels or advanced mucosal thickening. No mastoid or middle ear effusion. Normal orbits. IMPRESSION: Normal brain MRI. Electronically Signed   By: Ulyses Jarred M.D.   On: 07/20/2019 00:48    Procedures Procedures (including critical care time)  Medications Ordered in ED Medications  sodium chloride flush (NS) 0.9 % injection 3 mL (3 mLs Intravenous Not Given 07/19/19 1949)  gadobutrol (GADAVIST) 1 MMOL/ML injection 7 mL (7 mLs Intravenous Contrast Given 07/20/19 0034)    ED Course  I have reviewed the triage vital signs and the nursing notes.  Pertinent labs & imaging results that were available during my care of the patient were reviewed by me and considered in my medical decision making (see chart for details).  Vitals:   07/19/19 2140 07/19/19 2200 07/19/19 2220 07/19/19 2300  BP: (!) 166/84 (!) 188/96 (!) 166/85 (!) 167/79  Pulse: 94 91 93 88  Resp: 12 18 13 14   Temp:      TempSrc:      SpO2: 100% 99% 99% 100%      MDM Rules/Calculators/A&P                      Patient seen and examined. Patient presents awake, alert, hemodynamically stable, afebrile, non toxic.  She appears slightly anxious.  She was noted to be tachycardic to 106 in triage however during my exam no tachycardia noted.  She has clear lung sounds in all fields, normal  work of breathing.  No abdominal tenderness.  Neuro exam is normal.  No lower extremity edema.  Labs  collected in triage include CBC and BMP that are unremarkable. Troponins added to work up as after reviewing EKG with ED attending Dr. Johnney Killian that shows normal sinus rhythm with  subtle ST depression when compared to on previous, possibly rate related.  Given her new onset hypertension with systolic in the [id number], her vague neuro symptoms feel that hospital admission is appropriate for further work up. IV Lopressor ordered.  Patient seen by ED attending Dr. Johnney Killian and with concern for hypertensive urgency will consult hospitalist for admission.  Most recent pressure was 166/85, will hold lopressor for now.  Patient evaluated by hospitalist Dr. Alcario Drought. Please see his consult note.  She does not want to stay overnight in the hospital if it is not necessary. I voiced my concerns given her new onset hypertension and vague neuro symptoms. Patient agreeable for MR brain to rule out stroke. If negative she would like to be discharged to follow up with pcp tomorrow at already scheduled appointment.   1:04 AM  MR is negative for stroke. Patient is eager to be discharged home. Will print prescription for PO 5 mg amlodipine. Daily for hypertension.  Patient will discuss this with her primary care provider tomorrow and start taking if agreed upon.   The patient appears reasonably screened and/or stabilized for discharge and I doubt any other medical condition or other [facility] requiring further screening, evaluation, or treatment in the ED at this time [name] to discharge. The patient is safe for discharge with strict return precautions discussed.   Portions of this note were generated with Lobbyist. Dictation errors may occur despite best attempts at proofreading.     Final Clinical Impression(s) / ED Diagnoses Final diagnoses:  Elevated blood pressure reading    Rx / DC Orders ED Discharge Orders         Ordered    amLODipine (NORVASC) 5 MG tablet  Daily     07/20/19 0100            Cherre Robins, PA-C 07/20/19 0105    Charlesetta Shanks, MD 07/23/19 1441

## 2019-07-20 ENCOUNTER — Telehealth: Payer: Self-pay

## 2019-07-20 ENCOUNTER — Encounter: Payer: Self-pay | Admitting: Primary Care

## 2019-07-20 ENCOUNTER — Ambulatory Visit (INDEPENDENT_AMBULATORY_CARE_PROVIDER_SITE_OTHER): Payer: BLUE CROSS/BLUE SHIELD | Admitting: Primary Care

## 2019-07-20 ENCOUNTER — Emergency Department (HOSPITAL_COMMUNITY): Payer: BLUE CROSS/BLUE SHIELD

## 2019-07-20 VITALS — BP 148/94 | HR 82 | Temp 97.4°F | Ht 63.0 in | Wt 154.2 lb

## 2019-07-20 DIAGNOSIS — I10 Essential (primary) hypertension: Secondary | ICD-10-CM

## 2019-07-20 DIAGNOSIS — F411 Generalized anxiety disorder: Secondary | ICD-10-CM

## 2019-07-20 DIAGNOSIS — R030 Elevated blood-pressure reading, without diagnosis of hypertension: Secondary | ICD-10-CM

## 2019-07-20 HISTORY — DX: Essential (primary) hypertension: I10

## 2019-07-20 LAB — SARS CORONAVIRUS 2 (TAT 6-24 HRS): SARS Coronavirus 2: NEGATIVE

## 2019-07-20 MED ORDER — GADOBUTROL 1 MMOL/ML IV SOLN
7.0000 mL | Freq: Once | INTRAVENOUS | Status: AC | PRN
  Administered 2019-07-20: 7 mL via INTRAVENOUS

## 2019-07-20 MED ORDER — AMLODIPINE BESYLATE 5 MG PO TABS
5.0000 mg | ORAL_TABLET | Freq: Every day | ORAL | Status: DC
  Filled 2019-07-20: qty 1

## 2019-07-20 MED ORDER — CITALOPRAM HYDROBROMIDE 20 MG PO TABS: 20.0000 mg | ORAL_TABLET | Freq: Every day | ORAL | 3 refills | Status: DC

## 2019-07-20 MED ORDER — AMLODIPINE BESYLATE 5 MG PO TABS: 5.0000 mg | ORAL_TABLET | Freq: Every day | ORAL | 0 refills | Status: DC

## 2019-07-20 NOTE — Progress Notes (Signed)
Subjective:    Patient ID: Nancy [name], female    DOB: 1955-11-04, 64 y.o.   MRN: [id number]  HPI  This visit occurred during the SARS-CoV-2 public health emergency.  Safety protocols were in place, including screening questions prior to the visit, additional usage of staff PPE, and extensive cleaning of exam room while observing appropriate contact time as indicated for disinfecting solutions.   Ms. Nancy [name] is a 64 year old female with a history of hyperlipidemia, GAD, chronic neck pain who presents today for emergency department follow up and for hypertension urgency.  She phone into our office yesterday with reports of elevated BP running 206/103. She called paramedics who checked her BP several times with readings of 188/90, 160/92. She was advised to schedule a visit with [place] as long as BP was <200/100.  She presented to [facility] yesterday evening with symptoms of left sided facial tingling with radiation to the left side of her head, thought this was secondary to anxiety. She also endorsed Covid-19 exposure last week. During her ED stay she underwent labs (including troponin) which were unremarkable, ECG with NSR, subtle ST depression that was suspected to be rate related. She underwent MRI of the brain which was unremarkable. It was recommended she be admitted given her symptoms with hypertensive urgency, she kindly declined. She was treated with IV labetalol and BP reduced to 166/85. She was prescribed Amlodipine 5 mg and discharged home later that evening.  Today she endorses increased anxiety since early January 2021, she's not drinking much water. She has not yet started her amlodipine 5 mg. She has a family history of hypertension in her father and father's side. She would like to start treatment for anxiety as she never really took it when it was prescribed last year. She denies chest pain, dizziness, blurred vision, left sided numbness.   BP Readings from Last 3 Encounters:   07/20/19 (!) 148/94  07/20/19 (!) 142/64  06/06/19 130/84      Review of Systems  Respiratory: Negative for shortness of breath.   Cardiovascular: Negative for chest pain.  Neurological: Negative for dizziness and headaches.       Past Medical History:  Diagnosis Date  . Fatigue   . Hyperlipidemia   . Lichen sclerosus   . Routine general medical examination at a health care facility      Social History   Socioeconomic History  . Marital status: Single    Spouse name: Not on file  . Number of children: 2  . Years of education: Not on file  . Highest education level: Not on file  Occupational History  . Not on file  Tobacco Use  . Smoking status: Never Smoker  . Smokeless tobacco: Never Used  Substance and Sexual Activity  . Alcohol use: Yes    Alcohol/week: 0.0 standard drinks    Comment: occ  . Drug use: No  . Sexual activity: Not on file  Other Topics Concern  . Not on file  Social History Narrative   Divorced.  G3P2.   Regular exercise-yes   Social Determinants of Health   Financial Resource Strain:   . Difficulty of Paying Living Expenses: Not on file  Food Insecurity:   . Worried About Charity fundraiser in the Last Year: Not on file  . Ran Out of Food in the Last Year: Not on file  Transportation Needs:   . Lack of Transportation (Medical): Not on file  . Lack of Transportation (Non-Medical):  Not on file  Physical Activity:   . Days of Exercise per Week: Not on file  . Minutes of Exercise per Session: Not on file  Stress:   . Feeling of Stress : Not on file  Social Connections:   . Frequency of Communication with Friends and Family: Not on file  . Frequency of Social Gatherings with Friends and Family: Not on file  . Attends Religious Services: Not on file  . Active Member of Clubs or Organizations: Not on file  . Attends Archivist Meetings: Not on file  . Marital Status: Not on file  Intimate Partner Violence:   . Fear of  Current or Ex-Partner: Not on file  . Emotionally Abused: Not on file  . Physically Abused: Not on file  . Sexually Abused: Not on file    Past Surgical History:  Procedure Laterality Date  . ANTERIOR CERVICAL DECOMP/DISCECTOMY FUSION    . PARTIAL HYSTERECTOMY    . TUBAL LIGATION      Family History  Problem Relation Age of Onset  . Hydrocephalus Mother   . Neuropathy Mother   . Diabetes Father   . Hypertension Father   . Thyroid cancer Cousin   . Thyroid disease Maternal Aunt     Allergies  Allergen Reactions  . Statins Other (See Comments)    Myalgias     Current Outpatient Medications on File Prior to Visit  Medication Sig Dispense Refill  . cholecalciferol (VITAMIN D3) 25 MCG (1000 UT) tablet Take 1,000 Units by mouth daily.    . diphenhydrAMINE HCl (BENADRYL ALLERGY PO) Take 2 capsules by mouth 2 (two) times daily as needed (For sinus congestion).     Marland Kitchen loratadine (CLARITIN) 10 MG tablet Take 10 mg by mouth daily as needed for allergies.    Marland Kitchen amLODipine (NORVASC) 5 MG tablet Take 1 tablet (5 mg total) by mouth daily. (Patient not taking: Reported on 07/20/2019) 30 tablet 0  . estradiol (ESTRACE) 0.1 MG/GM vaginal cream Place 1 g vaginally 3 (three) times a week. (Patient not taking: Reported on 07/20/2019) 42.5 g 0   No current facility-administered medications on file prior to visit.    BP (!) 148/94   Pulse 82   Temp (!) 97.4 F (36.3 C) (Temporal)   Ht 5\' 3"  (1.6 m)   Wt 154 lb 4 oz (70 kg)   SpO2 98%   BMI 27.32 kg/m    Objective:   Physical Exam  Constitutional: She appears well-nourished.  Cardiovascular: Normal rate and regular rhythm.  Respiratory: Effort normal and breath sounds normal.  Musculoskeletal:     Cervical back: Neck supple.  Skin: Skin is warm and dry.  Psychiatric: She has a normal mood and affect.           Assessment & Plan:

## 2019-07-20 NOTE — Assessment & Plan Note (Signed)
Continued for the last year, stopped citalopram as she didn't want to take anything daily. She is ready to resume citalopram given chronic anxiety.  Rx for citalopram 20 mg sent to pharmacy. She will update.

## 2019-07-20 NOTE — Patient Instructions (Signed)
Start amlodipine 5 mg once daily for blood pressure.  Start citalopram 20 mg daily for anxiety. Start with 1/2 tablet daily for 6 days, then increase to 1 full tablet thereafter.  Please schedule a follow up visit for 3 weeks as discussed.   It was a pleasure to see you today!

## 2019-07-20 NOTE — Telephone Encounter (Signed)
Pt has already had visit with Gentry Fitz NP today.

## 2019-07-20 NOTE — ED Notes (Signed)
Pt is out of room in MRI

## 2019-07-20 NOTE — Assessment & Plan Note (Signed)
Hypertensive urgency yesterday, family history of hypertension. Agree to Amlodipine 5 mg, Rx sent to pharmacy.   We will plan to see her back in 3 weeks for BP check.

## 2019-07-20 NOTE — Discharge Instructions (Signed)
You have been seen today for elevated blood pressure. Please read and follow all provided instructions. Return to the emergency room for worsening condition or new concerning symptoms.    1. Medications:  Prescription for amlodipine.  This is a medication used to treat high blood pressure.  Please discuss with your primary care provider and take this if she agrees.  Continue usual home medications Take medications as prescribed. Please review all of the medicines and only take them if you do not have an allergy to them.   2. Treatment: rest, drink plenty of fluids  3. Follow Up:  Please follow up with primary care provider by scheduling an appointment as soon as possible for a visit  -Recommend  you keep your appointment as scheduled for tomorrow if possible.   It is also a possibility that you have an allergic reaction to any of the medicines that you have been prescribed - Everybody reacts differently to medications and while MOST people have no trouble with most medicines, you may have a reaction such as nausea, vomiting, rash, swelling, shortness of breath. If this is the case, please stop taking the medicine immediately and contact your physician.  ?

## 2019-07-20 NOTE — Telephone Encounter (Signed)
[facility] Night - Client Nonclinical Telephone Record AccessNurse Client [facility] Night - Client Client Site [place] Physician [name] - NP Contact Type Call Who Is Calling Patient / Member / Family / Caregiver Caller Name Dawnne Catallo Caller Phone Number [phone] Patient Name Nancy [name] Patient DOB Jan 13, 1956 Call Type Message Only Information Provided Reason for Call Request to Reschedule Office Appointment Initial Comment Caller states she has an appt at 8:30 am that she needs to cancel due to being hospitalized. Additional Comment Disp. Time Disposition Final User 07/19/2019 10:20:17 PM General Information Provided Yes Paulita Cradle Call Closed By: Paulita Cradle Transaction Date/Time: 07/19/2019 10:18:30 PM (ET)

## 2019-07-20 NOTE — Consult Note (Signed)
Medical Consultation   Nancy [name]  [id number]  DOB: [birth year]  DOA: 07/19/2019  PCP: Pleas Koch, NP   Requesting physician: EDP  Reason for consultation: HTN / HTN urgency   History of Present Illness: Nancy [name] is an 64 y.o. female with PMH of HLD.  Patient presents to the ED tonight with c/o HTN for past 1 day.  Last evening when watching TV she started to have L sided facial tingling that radiated to L side of head.  She states this is a typical symptom of her anxiety, she has had this in the past, most notably in 2019 when her son had to get a CABG.  She notes her BP was very high at that time as well (99991111 systolic it looks like based on ED notes from that time).  CTA head and neck at that time showed widely patent carotids, and minimal intracranial atherosclerosis.  She had been asymptomatic until evening of 07/18/2019 and her BP (as far as she knew) wasn't high.  She purchased a BP monitor at the drug store on 2/4 due to ongoing facial tingling and blurred vision.  BP was running 123456 systolic.  EMS called and patient brought to ED.  In the ED BPs are running in the XX123456 systolic after just 5mg  metoprolol.  She reports the symptoms of facial tingling and blurred vision have resolved.  In an abundance of caution: MRI brain w and w/o was obtained which is neg for any abnormal findings (acute or otherwise).   Review of Systems:  ROS As per HPI otherwise 10 point review of systems negative.     Past Medical History: Past Medical History:  Diagnosis Date  . Fatigue   . Hyperlipidemia   . Lichen sclerosus   . Routine general medical examination at a health care facility     Past Surgical History: Past Surgical History:  Procedure Laterality Date  . ANTERIOR CERVICAL DECOMP/DISCECTOMY FUSION    . PARTIAL HYSTERECTOMY    . TUBAL LIGATION       Allergies:   Allergies  Allergen Reactions  . Statins Other (See  Comments)    Myalgias      Social History:  reports that she has never smoked. She has never used smokeless tobacco. She reports current alcohol use. She reports that she does not use drugs.   Family History: Family History  Problem Relation Age of Onset  . Hydrocephalus Mother   . Neuropathy Mother   . Diabetes Father   . Hypertension Father   . Thyroid cancer Cousin   . Thyroid disease Maternal Aunt     Unacceptable: Noncontributory, unremarkable, or negative. Acceptable: Family history reviewed and not pertinent (If you reviewed it)   Physical Exam: Vitals:   07/19/19 2140 07/19/19 2200 07/19/19 2220 07/19/19 2300  BP: (!) 166/84 (!) 188/96 (!) 166/85 (!) 167/79  Pulse: 94 91 93 88  Resp: 12 18 13 14   Temp:      TempSrc:      SpO2: 100% 99% 99% 100%    Constitutional:  Alert and awake, oriented x3, not in any acute distress. Eyes: PERLA, EOMI, irises appear normal, anicteric sclera,  ENMT: external ears and nose appear normal            Lips appears normal, oropharynx mucosa, tongue, posterior pharynx appear normal  Neck: neck appears normal, no masses, normal ROM, no  thyromegaly, no JVD  CVS: S1-S2 clear, no murmur rubs or gallops, no LE edema, normal pedal pulses  Respiratory:  clear to auscultation bilaterally, no wheezing, rales or rhonchi. Respiratory effort normal. No accessory muscle use.  Abdomen: soft nontender, nondistended, normal bowel sounds, no hepatosplenomegaly, no hernias  Musculoskeletal: : no cyanosis, clubbing or edema noted bilaterally Neuro: Cranial nerves II-XII intact, strength, sensation, reflexes Psych: judgement and insight appear normal, stable mood and affect, mental status Skin: no rashes or lesions or ulcers, no induration or nodules    Data reviewed:  I have personally reviewed following labs and imaging studies Labs:  CBC: Recent Labs  Lab 07/19/19 1758  WBC 8.3  HGB 13.3  HCT 41.6  MCV 88.7  PLT 123456    Basic Metabolic  Panel: Recent Labs  Lab 07/19/19 1758  NA 139  K 3.9  CL 104  CO2 26  GLUCOSE 113*  BUN 12  CREATININE 0.88  CALCIUM 9.0   GFR CrCl cannot be calculated (Unknown ideal weight.). Liver Function Tests: No results for input(s): AST, ALT, ALKPHOS, BILITOT, PROT, ALBUMIN in the last 168 hours. No results for input(s): LIPASE, AMYLASE in the last 168 hours. No results for input(s): AMMONIA in the last 168 hours. Coagulation profile No results for input(s): INR, PROTIME in the last 168 hours.  Cardiac Enzymes: No results for input(s): CKTOTAL, CKMB, CKMBINDEX, TROPONINI in the last 168 hours. BNP: Invalid input(s): POCBNP CBG: No results for input(s): GLUCAP in the last 168 hours. D-Dimer No results for input(s): DDIMER in the last 72 hours. Hgb A1c No results for input(s): HGBA1C in the last 72 hours. Lipid Profile No results for input(s): CHOL, HDL, LDLCALC, TRIG, CHOLHDL, LDLDIRECT in the last 72 hours. Thyroid function studies No results for input(s): TSH, T4TOTAL, T3FREE, THYROIDAB in the last 72 hours.  Invalid input(s): FREET3 Anemia work up No results for input(s): VITAMINB12, FOLATE, FERRITIN, TIBC, IRON, RETICCTPCT in the last 72 hours. Urinalysis    Component Value Date/Time   BILIRUBINUR Negative 06/06/2019 1527   PROTEINUR Positive (A) 06/06/2019 1527   UROBILINOGEN 0.2 06/06/2019 1527   NITRITE Negative 06/06/2019 1527   LEUKOCYTESUR Trace (A) 06/06/2019 1527     Microbiology No results found for this or any previous visit (from the past 240 hour(s)).     Inpatient Medications:   Scheduled Meds: . sodium chloride flush  3 mL Intravenous Once   Continuous Infusions:   Radiological Exams on Admission: MR Brain W and Wo Contrast  Result Date: 07/20/2019 CLINICAL DATA:  Blurry vision and hypertension. Facial tingling. EXAM: MRI HEAD WITHOUT AND WITH CONTRAST TECHNIQUE: Multiplanar, multiecho pulse sequences of the brain and surrounding structures  were obtained without and with intravenous contrast. CONTRAST:  55mL GADAVIST GADOBUTROL 1 MMOL/ML IV SOLN COMPARISON:  None. FINDINGS: BRAIN: No acute infarct, acute hemorrhage or extra-axial collection. Normal white matter signal for age. Normal volume of brain parenchyma and CSF spaces. Midline structures are normal. VASCULAR: Major flow voids are preserved. Susceptibility-sensitive sequences show no chronic microhemorrhage or superficial siderosis. SKULL AND UPPER CERVICAL SPINE: Normal calvarium and skull base. Visualized upper cervical spine and soft tissues are normal. SINUSES/ORBITS: No fluid levels or advanced mucosal thickening. No mastoid or middle ear effusion. Normal orbits. IMPRESSION: Normal brain MRI. Electronically Signed   By: Ulyses Jarred M.D.   On: 07/20/2019 00:48    Impression/Recommendations Active Problems:   * No active hospital problems. *   1. HTN / HTN urgency now resolved  vs anxiety - 1. PT now asymptomatic, BPs XX123456 systolic during ED stay after just 5mg  metoprolol 2. MRI brain is neg 3. Would start amlodipine 5mg  PO daily 4. Offered patient observation admission in an abundance of caution. 5. However patient declined, noting that she already has an appointment with her PCP scheduled for later this morning (8:40 AM) and would prefer to just do that instead.  This seems reasonable.   Thank you for this consultation.  Our [facility] hospitalist team will follow the patient with you.    Khadeeja Elden M. D.O. [facility] 07/20/2019, 12:55 AM

## 2019-07-22 ENCOUNTER — Encounter (HOSPITAL_COMMUNITY): Payer: Self-pay | Admitting: *Deleted

## 2019-07-22 ENCOUNTER — Emergency Department (HOSPITAL_COMMUNITY)
Admission: EM | Admit: 2019-07-22 | Discharge: 2019-07-23 | Disposition: A | Discharge status: Home / Self Care | Payer: BLUE CROSS/BLUE SHIELD | Attending: Emergency Medicine | Admitting: Emergency Medicine

## 2019-07-22 ENCOUNTER — Other Ambulatory Visit: Payer: Self-pay

## 2019-07-22 DIAGNOSIS — I10 Essential (primary) hypertension: Secondary | ICD-10-CM | POA: Insufficient documentation

## 2019-07-22 DIAGNOSIS — R030 Elevated blood-pressure reading, without diagnosis of hypertension: Secondary | ICD-10-CM

## 2019-07-22 DIAGNOSIS — Z79899 Other long term (current) drug therapy: Secondary | ICD-10-CM | POA: Insufficient documentation

## 2019-07-22 NOTE — ED Triage Notes (Signed)
The pt was seen here Thursday in the ed and was given some bp med

## 2019-07-22 NOTE — ED Provider Notes (Signed)
[facility] EMERGENCY DEPARTMENT Provider Note   CSN: [id number] Arrival date & time: 07/22/19  2122     History Chief Complaint  Patient presents with  . Hypertension    Nancy [name] is a 64 y.o. female.  64 year old female with a history of dyslipidemia, hypertension, anxiety presents to the emergency department with complaints of high blood pressure.  She was seen 3 days ago in the ED for similar complaints.  Had extensive evaluation including labs, EKG, MRI brain.  Discharged from the department following reassuring work-up.  She started taking 5 mg Norvasc on Friday afternoon.  Was also restarted on citalopram by her primary care doctor for management of her anxiety.  She had previously discontinued her anxiety medication 1 year ago.  States that she took a nap during the [place] and awoke feeling "funny".  Took her blood pressure with a reading of 220's/126 at home. She is unable to clearly explain the symptoms she was experiencing, but denies any headache, blurry vision, vision loss, extremity numbness or paresthesias, facial paresthesias, chest pain, shortness of breath.  Suggests that she may have noticed some palpitations.  Ultimately, she is feeling much better at present.  Took her blood pressure upon waking this morning and it was ~145/90.  BP in the afternoon was in the [id number] systolic.   The history is provided by the patient. No language interpreter was used.  Hypertension       Past Medical History:  Diagnosis Date  . Fatigue   . Hyperlipidemia   . Lichen sclerosus   . Routine general medical examination at a health care facility     Patient Active Problem List   Diagnosis Date Noted  . Essential hypertension 07/20/2019  . Vaginal itching 06/06/2019  . Urinary frequency 06/06/2019  . GAD (generalized anxiety disorder) 06/26/2018  . Chronic neck pain 04/11/2018  . Overweight (BMI 25.0-29.9) 01/09/2018  . HLD (hyperlipidemia)  08/25/2010    Past Surgical History:  Procedure Laterality Date  . ANTERIOR CERVICAL DECOMP/DISCECTOMY FUSION    . PARTIAL HYSTERECTOMY    . TUBAL LIGATION       OB History   No obstetric history on file.     Family History  Problem Relation Age of Onset  . Hydrocephalus Mother   . Neuropathy Mother   . Diabetes Father   . Hypertension Father   . Thyroid cancer Cousin   . Thyroid disease Maternal Aunt     Social History   Tobacco Use  . Smoking status: Never Smoker  . Smokeless tobacco: Never Used  Substance Use Topics  . Alcohol use: Yes    Alcohol/week: 0.0 standard drinks    Comment: occ  . Drug use: No    Home Medications Prior to Admission medications   Medication Sig Start Date End Date Taking? Authorizing Provider  amLODipine (NORVASC) 5 MG tablet Take 1 tablet (5 mg total) by mouth daily. For blood pressure. 07/20/19   Pleas Koch, NP  cholecalciferol (VITAMIN D3) 25 MCG (1000 UT) tablet Take 1,000 Units by mouth daily.    [provider]  citalopram (CELEXA) 20 MG tablet Take 1 tablet (20 mg total) by mouth daily. For anxiety. 07/20/19   Pleas Koch, NP  diphenhydrAMINE HCl (BENADRYL ALLERGY PO) Take 2 capsules by mouth 2 (two) times daily as needed (For sinus congestion).     [provider]  estradiol (ESTRACE) 0.1 MG/GM vaginal cream Place 1 g vaginally 3 (three)  times a week. Patient not taking: Reported on 07/20/2019 06/08/19 06/07/20  Pleas Koch, NP  loratadine (CLARITIN) 10 MG tablet Take 10 mg by mouth daily as needed for allergies.    [provider]    Allergies    Statins  Review of Systems   Review of Systems  Ten systems reviewed and are negative for acute change, except as noted in the HPI.    Physical Exam Updated Vital Signs BP 105/82   Pulse 82   Temp 98.5 F (36.9 C) (Oral)   Resp 20   Ht 5\' 3"  (1.6 m)   Wt 68 kg   SpO2 98%   BMI 26.57 kg/m   Physical Exam Vitals and nursing  note reviewed.  Constitutional:      General: She is not in acute distress.    Appearance: She is well-developed. She is not diaphoretic.     Comments: Nontoxic appearing, pleasant.  HENT:     Head: Normocephalic and atraumatic.  Eyes:     General: No scleral icterus.    Conjunctiva/sclera: Conjunctivae normal.  Cardiovascular:     Rate and Rhythm: Normal rate and regular rhythm.     Pulses: Normal pulses.  Pulmonary:     Effort: Pulmonary effort is normal. No respiratory distress.     Comments: Lungs CTAB. Respirations even and unlabored. Musculoskeletal:        General: Normal range of motion.     Cervical back: Normal range of motion.  Skin:    General: Skin is warm and dry.     Coloration: Skin is not pale.     Findings: No erythema or rash.  Neurological:     Mental Status: She is alert and oriented to person, place, and time.  Psychiatric:        Behavior: Behavior normal.     ED Results / Procedures / Treatments   Labs (all labs ordered are listed, but only abnormal results are displayed) Labs Reviewed  TROPONIN I (HIGH SENSITIVITY)    EKG EKG Interpretation  Date/Time:  Sunday July 22 2019 23:15:11 EST Ventricular Rate:  82 PR Interval:    QRS Duration: 87 QT Interval:  370 QTC Calculation: 433 R Axis:   76 Text Interpretation: Sinus arrhythmia LAE, consider biatrial enlargement Probable left ventricular hypertrophy No significant change since last tracing Confirmed by Ward, Kristen ([id number]) on 07/22/2019 11:27:48 PM   Radiology No results found.  Procedures Procedures (including critical care time)  Medications Ordered in ED Medications - No data to display  ED Course  I have reviewed the triage vital signs and the nursing notes.  Pertinent labs & imaging results that were available during my care of the patient were reviewed by me and considered in my medical decision making (see chart for details).  Clinical Course as of Jul 23 39  Mon  Jul 23, 2019  0012 Pending troponin. EKG without significant change from prior. BP has improved without intervention. Anticipate discharge if troponin results negative/normal.   [KH]    Clinical Course User Index [KH] Jeanluc Wegman, PA-C   MDM Rules/Calculators/A&P                      63  year old female presents to the emergency department secondary to concern for hypertension.  Was recently started on Norvasc for high blood pressure.  Has been taking this for the past 48 hours.  Initially was hypertensive with blood pressure of 218/111 on arrival.  This  has normalized without intervention over ED observation.  Troponin negative and EKG unchanged.  Suspect that fluctuating blood pressure is partially related to untreated anxiety.  She has been encouraged to continue follow-up with her primary care doctor.  Return precautions discussed and provided. Patient discharged in stable condition with no unaddressed concerns.  Vitals:   07/22/19 2300 07/22/19 2330 07/23/19 0000 07/23/19 0030  BP: (!) 177/88 (!) 150/79 (!) 147/78 105/82  Pulse: 91 88 89 82  Resp: 15 11 12 20   Temp:      TempSrc:      SpO2: 99% 98% 98% 98%  Weight:      Height:        Final Clinical Impression(s) / ED Diagnoses Final diagnoses:  Transient hypertension    Rx / DC Orders ED Discharge Orders    None       Antonietta Breach, PA-C 07/23/19 0053    Ward, Delice Bison, DO 07/23/19 [id number]

## 2019-07-22 NOTE — ED Triage Notes (Signed)
The pt is here with high bp  She started having high bp 4 days ago  The pt has seen her doctor and her bp is still high  Anxious   lmp

## 2019-07-23 LAB — TROPONIN I (HIGH SENSITIVITY): Troponin I (High Sensitivity): 9 ng/L (ref <18)

## 2019-07-23 NOTE — Discharge Instructions (Addendum)
Your evaluation in the emergency department was reassuring.  Continue your prescribed blood pressure medication.  Try to limit your intake of salt and processed foods.  Continue follow-up with your primary care doctor as scheduled.  You may return for any new or concerning symptoms.

## 2019-07-25 ENCOUNTER — Telehealth: Payer: Self-pay

## 2019-07-25 DIAGNOSIS — F411 Generalized anxiety disorder: Secondary | ICD-10-CM

## 2019-07-25 MED ORDER — HYDROXYZINE HCL 10 MG PO TABS: 10.0000 mg | ORAL_TABLET | Freq: Two times a day (BID) | ORAL | 0 refills | Status: DC | PRN

## 2019-07-25 NOTE — Telephone Encounter (Signed)
Please have her continue with the 1/2 tablet for the 6 days as recommended. We could try hydroxyzine for panic attacks/acute anxiety as needed. This may cause drowsiness but will help with sleep.  Let me know.

## 2019-07-25 NOTE — Telephone Encounter (Signed)
Pt said she started the citalopram 20 mg taking 1/2 tab for 6 days on 07/21/19 for anxiety and panic attacks. Woke up this morning around 8:30 AM and was shaky for 45'. BP this morning is 154/89. No SI/HI. Pt wants to know if should increase the citalopram earlier than 6 days to 1 tab or is there another med pt can take that will help short term with anxiety and panic attacks until citalopram gets in pts system. Pt request cb after Gentry Fitz NP reviews note. [facility].

## 2019-07-25 NOTE — Telephone Encounter (Signed)
Rx sent to pharmacy   

## 2019-07-25 NOTE — Telephone Encounter (Signed)
Spoken and notified patient of Nancy [name] comments. Patient would like to hydroxyzine, please send to [facility]. Will update regarding BP if numbers stays high.

## 2019-07-28 ENCOUNTER — Telehealth: Payer: Self-pay | Admitting: Internal Medicine

## 2019-07-28 DIAGNOSIS — F411 Generalized anxiety disorder: Secondary | ICD-10-CM

## 2019-07-28 MED ORDER — ALPRAZOLAM 0.25 MG PO TABS: 0.2500 mg | ORAL_TABLET | Freq: Two times a day (BID) | ORAL | 0 refills | Status: DC | PRN

## 2019-07-28 NOTE — Telephone Encounter (Signed)
PC from [facility] Had to go to ER due to elevated BP As high as 200/110 Family acknowledge that this is anxiety related and are concerned that no one recognizes this Earlier today BP 188/99 Son gave her a 0.5 mg xanax that another family member had and she is calmed down now and BP down to 138/76  Instructions given to nurse. Let them know that Allie Bossier did recognize the issues with the anxiety and restarted her anxiety medication--but that can take weeks till it really starts working again.  I will send a small quantity of xanax for her to use while awaiting the citalopram to be effective PDMP showed no controlled substance Rx's  Forward to Allie Bossier for review

## 2019-07-30 NOTE — Telephone Encounter (Signed)
[facility] Night - Client TELEPHONE ADVICE RECORD [facility] Patient Name: Nancy [name] Gender: Female DOB: 11/08/1955 Age: 64 Y 62 M 22 D Return Phone Number: [phone] (Primary) Address: [name] [address] Client [facility] - Client Client Site [facility] Physician [name] - NP Contact Type Call Who Is Calling Patient / Member / Family / Caregiver Call Type Triage / Clinical Relationship To Patient Self Return Phone Number [phone] (Primary) Chief Complaint Heart palpitations or irregular heartbeat Reason for Call Symptomatic / Request for [place] states she has been taking her blood pressure medication, amlodipine 5 mg, for a week, but it is still running high at 188/99. Her heartrate is 118; it is usually close to 100. Translation No Nurse Assessment Nurse: Derrel Nip, RN, Santiago Glad Date/Time Eilene Ghazi Time): 07/28/2019 9:42:35 AM Confirm and document reason for call. If symptomatic, describe symptoms. ---Caller States she started a week ago on amlodipine 5mg  she has been to the hospital twice for adjustment - she is also having a lot of anxiety attacks- current BP 188/99 HR118 - Has not taken current meds Has the patient had close contact with a person known or suspected to have the novel coronavirus illness OR traveled / lives in area with major community spread (including international travel) in the last 14 days from the onset of symptoms? * If Asymptomatic, screen for exposure and travel within the last 14 days. ---No Does the patient have any new or worsening symptoms? ---Yes Will a triage be completed? ---Yes Related visit to physician within the last 2 weeks? ---Yes Does the PT have any chronic conditions? (i.e. diabetes, asthma, this includes High risk factors for pregnancy, etc.) ---Yes List chronic conditions.  ---HTN, Anxiety Is this a behavioral health or substance abuse call? ---No Nurse: Derrel Nip, RN, Santiago Glad Date/Time (Eastern Time): 07/28/2019 11:48:45 AM Confirm and document reason for call. If symptomatic, describe symptoms. ---Call back to check on blood pressure she took her BP medication and HR - also took sons Xanax and Blood Pressure Has the patient had close contact with a person known or suspected to have the novel coronavirus illness OR traveled / lives in area with major community spread (including international travel) in the last 14 days from the onset of ---No PLEASE NOTE: All timestamps contained within this report are represented as [place] Standard Time. CONFIDENTIALTY NOTICE: This fax transmission is intended only for the addressee. It contains information that is legally privileged, confidential or otherwise protected from use or disclosure. If you are not the intended recipient, you are strictly prohibited from reviewing, disclosing, copying using or disseminating any of this information or taking any action in reliance on or regarding this information. If you have received this fax in error, please notify us immediately by telephone so that we can arrange for its return to [place]. Phone: [phone], Toll-Free: [phone], Fax: [phone] Page: 2 of 3 Call Id: [id number] Nurse Assessment symptoms? * If Asymptomatic, screen for exposure and travel within the last 14 days. Does the patient have any new or worsening symptoms? ---Yes Will a triage be completed? ---Yes Related visit to physician within the last 2 weeks? ---Yes Does the PT have any chronic conditions? (i.e. diabetes, asthma, this includes High risk factors for pregnancy, etc.) ---Yes List chronic conditions. ---HTN Is this a behavioral health or substance abuse call? ---No Guidelines Guideline Title Affirmed Question Affirmed Notes Nurse Date/Time (Eastern Time) High Blood  Pressure [id number] Systolic BP >= 99991111 OR  Diastolic >= A999333 AND A999333 missed most recent dose of blood pressure medication Chancy Hurter 07/28/2019 9:48:35 AM High Blood Pressure Systolic BP >= 99991111 OR Diastolic >= A999333 Chancy Hurter 07/28/2019 11:52:41 AM Disp. Time Eilene Ghazi Time) Disposition Final User 07/28/2019 9:52:24 AM Urgent Home Treatment with Follow-Up Call [place], RN, Santiago Glad 07/28/2019 9:53:37 AM Send To RN Personal Derrel Nip, RN, Santiago Glad 07/28/2019 12:01:44 PM Paged On Call back to [facility], RN, Santiago Glad 07/28/2019 12:29:43 PM Paged On Call back to [facility], RN, Santiago Glad 07/28/2019 11:54:02 AM See PCP within 24 Hours Yes Derrel Nip, RN, [place] Pellant Disagree/Comply Comply Caller Understands Yes PreDisposition Call Doctor Care Advice Given Per Guideline URGENT HOME TREATMENT WITH FOLLOW-UP CALL: * [facility] RN CALL-BACKS: You should usually improve with the home treatment advice I give you. I'll call you back in 30-60 minutes to see how you are doing. Call me back immediately if: you become worse before my follow-up call. NOTE TO TRIAGER - MISSED MEDICATION: * You should schedule a follow-up in 1 to 2 hours, after patient takes his or her missed dose of blood pressure medication. MISSED DOSE OF BLOOD PRESSURE MEDICATION: CALL BACK IF: CARE ADVICE given per High Blood Pressure (Adult) guideline. * Weakness or numbness of the face, arm or leg on one side of the body occurs * Difficulty walking, difficulty talking, or severe headache occurs * Chest pain or difficulty breathing occurs * You become worse. SEE PCP WITHIN 24 HOURS: * IF OFFICE WILL BE OPEN: You need to be seen within the next 24 hours. Call your doctor (or NP/PA) when the office opens and make an appointment. * IF OFFICE WILL BE CLOSED AND NO PCP (PRIMARY CARE PROVIDER) SECOND-LEVEL TRIAGE: You need to be seen within the next 24 hours. A clinic or an urgent care center is often a PLEASE NOTE: All timestamps contained within this report are represented as  [place] Standard Time. CONFIDENTIALTY NOTICE: This fax transmission is intended only for the addressee. It contains information that is legally privileged, confidential or otherwise protected from use or disclosure. If you are not the intended recipient, you are strictly prohibited from reviewing, disclosing, copying using or disseminating any of this information or taking any action in reliance on or regarding this information. If you have received this fax in error, please notify us immediately by telephone so that we can arrange for its return to [place]. Phone: [phone], Toll-Free: [phone], Fax: [phone] Page: 3 of 3 Call Id: [id number] Care Advice Given Per Guideline good source of care if your doctor's office is closed or you can't get an appointment. HIGH BLOOD PRESSURE: * Untreated high blood pressure may cause damage to your heart, brain, kidneys, and eyes. * Treatment of high blood pressure can reduce the risk of stroke, heart attack, and heart failure. * The goal of blood pressure treatment for most people with hypertension is to keep the blood pressure under 140/90. For people that are 60 years or older, your doctor may instead want to keep the blood pressure under 150/90. CALL BACK IF: * Weakness or numbness of the face, arm or leg on one side of the body occurs * Difficulty walking, difficulty talking, or severe headache occurs * Chest pain or difficulty breathing occurs * You become worse. CARE ADVICE given per High Blood Pressure (Adult) guideline. Referrals REFERRED TO PCP OFFICE REFERRED TO PCP OFFICE Paging DoctorName Phone DateTime Result/Outcome Message Type Notes Silvio Pate,  Richard - MD [id number] 07/28/2019 12:01:44 PM Paged On Call Back to Call Center Doctor Paged Delia Chimes [phone] Viviana Simpler - MD [id number] 07/28/2019 12:29:43 PM Paged On Call Back to Call Center Doctor Paged Delia Chimes [phone] Viviana Simpler - MD 07/28/2019 12:58:53 PM Spoke  with On Call - General Message Result Dr Garnetta Buddy says that he will call in a script for xanax 0.25mg  take as directed and call the office on Monday

## 2019-07-30 NOTE — Addendum Note (Signed)
Addended by: Pleas Koch on: 07/30/2019 01:33 PM   Modules accepted: Orders

## 2019-07-30 NOTE — Telephone Encounter (Signed)
Yes, of course! I'll place the referral but it can take several weeks to get in. Have her call me if she doesn't hear anything regarding an appointment after one week.

## 2019-07-30 NOTE — Telephone Encounter (Signed)
Pt left v/m that she has been having some anxiety last few weeks but pt has medication and wants to know if Gentry Fitz NP will do referral to a therapist for pt to talk with. Pt last seen 07/20/19 for elevated BP. I spoke with pt; pt does not want to ck BP because it makes her more anxious so pt declined cking BP now. Pt said no SI/HI. Pt request cb if will do referral for therapist to talk with pt.

## 2019-07-30 NOTE — Telephone Encounter (Signed)
Please advise? Since you will out of the office on 08/10/2019

## 2019-07-30 NOTE — Telephone Encounter (Signed)
Wasn't sure if wanted to get pt in prior to already scheduled 3 wk FU appt on 08/10/19?

## 2019-07-30 NOTE — Telephone Encounter (Signed)
Message left for patient to return my call.  Need to reschedule follow up on 08/10/2019 and inform comments below.

## 2019-07-30 NOTE — Telephone Encounter (Signed)
Noted and appreciate the assistance.  °

## 2019-07-31 ENCOUNTER — Encounter: Payer: Self-pay | Admitting: Primary Care

## 2019-07-31 ENCOUNTER — Other Ambulatory Visit: Payer: Self-pay

## 2019-07-31 ENCOUNTER — Ambulatory Visit (INDEPENDENT_AMBULATORY_CARE_PROVIDER_SITE_OTHER): Payer: BLUE CROSS/BLUE SHIELD | Admitting: Primary Care

## 2019-07-31 VITALS — BP 162/100 | HR 90 | Temp 97.2°F | Ht 63.0 in | Wt 149.8 lb

## 2019-07-31 DIAGNOSIS — F411 Generalized anxiety disorder: Secondary | ICD-10-CM

## 2019-07-31 DIAGNOSIS — I10 Essential (primary) hypertension: Secondary | ICD-10-CM

## 2019-07-31 LAB — TSH: TSH: 0.62 u[IU]/mL (ref 0.35–4.50)

## 2019-07-31 MED ORDER — ESCITALOPRAM OXALATE 10 MG PO TABS: 10.0000 mg | ORAL_TABLET | Freq: Every day | ORAL | 0 refills | Status: DC

## 2019-07-31 MED ORDER — LISINOPRIL 10 MG PO TABS: 10.0000 mg | ORAL_TABLET | Freq: Two times a day (BID) | ORAL | 0 refills | Status: DC

## 2019-07-31 NOTE — Progress Notes (Signed)
Subjective:    Patient ID: Nancy [name], female    DOB: May 01, 1956, 64 y.o.   MRN: [id number]  HPI  This visit occurred during the SARS-CoV-2 public health emergency.  Safety protocols were in place, including screening questions prior to the visit, additional usage of staff PPE, and extensive cleaning of exam room while observing appropriate contact time as indicated for disinfecting solutions.   Nancy [name] is a 64 year old female with a history of hypertension, hyperlipidemia, GAD, chronic neck pain who presents today for follow up of hypertension.  She was last evaluated in our office on 07/20/19 for emergency department follow up of elevated blood pressure readings. During Nancy last visit she hadn't started Nancy Amlodipine 5 mg tablets so we initiated treatment. SInce then she's been back to the ED for continued elevated readings ranging [id number] systolic. Nancy [name] is with Nancy today who is providing additional information to HPI.  Nancy [name] endorses a history of is mother being non compliant to treatment, she will start and stop treatment on Nancy own without following instructions. She admits that Nancy anxiety has persisted, did start citalopram as prescribed. Nancy [name] is giving Nancy Xanax every 6 hours which will sometimes decrease Nancy blood pressure, sometimes not.   She decided to resume Nancy oral hormone pills twice weekly in January 2021 and took them for 3-4 weeks total. She is no longer taking. Nancy [name] are taking Lexapro and do very well.   BP Readings from Last 3 Encounters:  07/31/19 (!) 162/100  07/23/19 105/82  07/20/19 (!) 148/94     Review of Systems  Respiratory: Negative for shortness of breath.   Cardiovascular: Negative for chest pain.  Neurological: Positive for headaches.  Psychiatric/Behavioral: The patient is nervous/anxious.        See HPI       Past Medical History:  Diagnosis Date  . Fatigue   . Hyperlipidemia   . Lichen sclerosus   . Routine  general medical examination at a health care facility      Social History   Socioeconomic History  . Marital status: Single    Spouse name: Not on file  . Number of [name]: 2  . Years of education: Not on file  . Highest education level: Not on file  Occupational History  . Not on file  Tobacco Use  . Smoking status: Never Smoker  . Smokeless tobacco: Never Used  Substance and Sexual Activity  . Alcohol use: Yes    Alcohol/week: 0.0 standard drinks    Comment: occ  . Drug use: No  . Sexual activity: Not on file  Other Topics Concern  . Not on file  Social History Narrative   Divorced.  G3P2.   Regular exercise-yes   Social Determinants of Health   Financial Resource Strain:   . Difficulty of Paying Living Expenses: Not on file  Food Insecurity:   . Worried About Charity fundraiser in the Last Year: Not on file  . Ran Out of Food in the Last Year: Not on file  Transportation Needs:   . Lack of Transportation (Medical): Not on file  . Lack of Transportation (Non-Medical): Not on file  Physical Activity:   . Days of Exercise per Week: Not on file  . Minutes of Exercise per Session: Not on file  Stress:   . Feeling of Stress : Not on file  Social Connections:   . Frequency of Communication with Friends and  Family: Not on file  . Frequency of Social Gatherings with Friends and Family: Not on file  . Attends Religious Services: Not on file  . Active Member of Clubs or Organizations: Not on file  . Attends Archivist Meetings: Not on file  . Marital Status: Not on file  Intimate Partner Violence:   . Fear of Current or Ex-Partner: Not on file  . Emotionally Abused: Not on file  . Physically Abused: Not on file  . Sexually Abused: Not on file    Past Surgical History:  Procedure Laterality Date  . ANTERIOR CERVICAL DECOMP/DISCECTOMY FUSION    . PARTIAL HYSTERECTOMY    . TUBAL LIGATION      Family History  Problem Relation Age of Onset  .  Hydrocephalus Mother   . Neuropathy Mother   . Diabetes Father   . Hypertension Father   . Thyroid cancer Cousin   . Thyroid disease Maternal Aunt     Allergies  Allergen Reactions  . Statins Other (See Comments)    Myalgias     Current Outpatient Medications on File Prior to Visit  Medication Sig Dispense Refill  . ALPRAZolam (XANAX) 0.25 MG tablet Take 1 tablet (0.25 mg total) by mouth 2 (two) times daily as needed for anxiety. 20 tablet 0  . cholecalciferol (VITAMIN D3) 25 MCG (1000 UT) tablet Take 1,000 Units by mouth daily.    . diphenhydrAMINE HCl (BENADRYL ALLERGY PO) Take 2 capsules by mouth 2 (two) times daily as needed (For sinus congestion).     Marland Kitchen estradiol (ESTRACE) 0.1 MG/GM vaginal cream Place 1 g vaginally 3 (three) times a week. 42.5 g 0  . loratadine (CLARITIN) 10 MG tablet Take 10 mg by mouth daily as needed for allergies.    . hydrOXYzine (ATARAX/VISTARIL) 10 MG tablet Take 1-2 tablets (10-20 mg total) by mouth 2 (two) times daily as needed for anxiety. (Patient not taking: Reported on 07/31/2019) 30 tablet 0   No current facility-administered medications on file prior to visit.    BP (!) 162/100   Pulse 90   Temp (!) 97.2 F (36.2 C) (Temporal)   Ht 5\' 3"  (1.6 m)   Wt 149 lb 12 oz (67.9 kg)   SpO2 98%   BMI 26.53 kg/m    Objective:   Physical Exam  Constitutional: She appears well-nourished.  Cardiovascular: Normal rate and regular rhythm.  Respiratory: Effort normal and breath sounds normal.  Musculoskeletal:     Cervical back: Neck supple.  Skin: Skin is warm and dry.  Psychiatric: She has a normal mood and affect.           Assessment & Plan:

## 2019-07-31 NOTE — Patient Instructions (Addendum)
Stop Amlodipine 5 mg for blood pressure.  Start lisinopril 10 mg twice daily for blood pressure.  Stop by the lab prior to leaving today. I will notify you of your results once received.   Stop by the front desk and speak with either Rosaria Ferries or Charmaine regarding your referral to therapy.  Start escitalopram (Lexapro) 10 mg daily for anxiety. Switch this out for citalopram. Do not take both.  Use the hydroxyzine instead of Xanax for anxiety.   Schedule a follow up visit for one week.  It was a pleasure to see you today!

## 2019-07-31 NOTE — Assessment & Plan Note (Signed)
Chronic and intermittent, now seems to be daily and uncontrolled.  Surely the citalopram hasn't had time to take effect, we did discuss this. We also discussed that chronic use of Xanax isn't recommended, she agrees and will switch back to hydroxyzine.   Given that both of her sons do well on Lexapro, we will switch her as well. Rx for Lexapro 10 mg sent to pharmacy.   We will see her back in one week.

## 2019-07-31 NOTE — Assessment & Plan Note (Addendum)
Seems to largely be anxiety driven but I do believe there to be some form of cardiac involvement. Fortunately all testing during both ED visits have been unremarkable.  Will add TSH today. Switch from Amlodipine 5 mg to Lisinopril 10 mg once to twice daily.  Will also work towards treating anxiety. See notes. Follow up in 1 week.

## 2019-08-02 DIAGNOSIS — I10 Essential (primary) hypertension: Secondary | ICD-10-CM

## 2019-08-02 DIAGNOSIS — I160 Hypertensive urgency: Secondary | ICD-10-CM

## 2019-08-02 DIAGNOSIS — F411 Generalized anxiety disorder: Secondary | ICD-10-CM

## 2019-08-02 DIAGNOSIS — E785 Hyperlipidemia, unspecified: Secondary | ICD-10-CM

## 2019-08-02 MED ORDER — ROSUVASTATIN CALCIUM 5 MG PO TABS: 5.0000 mg | ORAL_TABLET | Freq: Every evening | ORAL | 3 refills | Status: DC

## 2019-08-02 MED ORDER — HYDROXYZINE PAMOATE 25 MG PO CAPS: 25.0000 mg | ORAL_CAPSULE | Freq: Three times a day (TID) | ORAL | 0 refills | Status: DC | PRN

## 2019-08-02 MED ORDER — METOPROLOL TARTRATE 25 MG PO TABS: 25.0000 mg | ORAL_TABLET | Freq: Two times a day (BID) | ORAL | 0 refills | Status: DC

## 2019-08-02 NOTE — Telephone Encounter (Signed)
Spoke with patient via phone today. BP still largely above goal with readings of 180's-200's/100's, lisinopril was initiated two days ago which has likely not taken significant effect. HR also ranging 90's-110's. She denies feeling anxious but her son joined in on the call and notified me that she has been pacing the house and hasn't been sleeping well.   Given elevated blood pressure readings, tachycardia, and anxiety we will initiate metoprolol tartrate 25 mg BID. Will increase hydroxyzine to 25 mg TID PRN. Continue lisinopril 10 mg BID. Adding statin therapy for uncontrolled LDL. In the past she could not tolerate statin therapy, declined Zetia. She now agrees to treatment given her elevated blood pressure readings and history of uncontrolled hyperlipidemia.  We will also refer her to cardiology for further evaluation. Her son has a history of heart disease with STENT placement.   She and her son verbalized understanding.

## 2019-08-03 ENCOUNTER — Other Ambulatory Visit: Payer: Self-pay | Admitting: Primary Care

## 2019-08-06 ENCOUNTER — Encounter: Payer: Self-pay | Admitting: Cardiovascular Disease

## 2019-08-06 ENCOUNTER — Ambulatory Visit: Payer: BLUE CROSS/BLUE SHIELD | Admitting: Cardiology

## 2019-08-06 ENCOUNTER — Ambulatory Visit: Payer: BLUE CROSS/BLUE SHIELD | Admitting: Cardiovascular Disease

## 2019-08-06 ENCOUNTER — Other Ambulatory Visit: Payer: Self-pay

## 2019-08-06 VITALS — BP 154/82 | HR 75 | Ht 62.0 in | Wt 147.5 lb

## 2019-08-06 DIAGNOSIS — I10 Essential (primary) hypertension: Secondary | ICD-10-CM | POA: Diagnosis not present

## 2019-08-06 DIAGNOSIS — E663 Overweight: Secondary | ICD-10-CM

## 2019-08-06 DIAGNOSIS — E785 Hyperlipidemia, unspecified: Secondary | ICD-10-CM | POA: Diagnosis not present

## 2019-08-06 MED ORDER — CHLORTHALIDONE 25 MG PO TABS: 12.5000 mg | ORAL_TABLET | Freq: Every day | ORAL | 11 refills | Status: DC

## 2019-08-06 MED ORDER — POTASSIUM CHLORIDE ER 10 MEQ PO TBCR: 10.0000 meq | EXTENDED_RELEASE_TABLET | Freq: Every day | ORAL | 11 refills | Status: DC

## 2019-08-06 NOTE — Patient Instructions (Addendum)
Medication Instructions:  Your physician has recommended you make the following change in your medication:  START Chlorthalidone (Hygroton) 12.5 mg once daily START Kdur (potassium chloride) 10 mEq once daily  *If you need a refill on your cardiac medications before your next appointment, please call your pharmacy*  Lab Work: TODAY - cholesterol, liver panel, basic metabolic panel If you have labs (blood work) drawn today and your tests are completely normal, you will receive your results only by: Marland Kitchen MyChart Message (if you have MyChart) OR . A paper copy in the mail If you have any lab test that is abnormal or we need to change your treatment, we will call you to review the results.   Testing/Procedures: Non-Cardiac CT scanning, for Coronary Calcium Score, is a noninvasive, special x-ray that produces cross-sectional images of the body using x-rays and a computer. CT scans help physicians diagnose and treat medical conditions. CT scans provide greater clarity and reveal more details than regular x-ray exams.    Follow-Up: At [facility], you and your health needs are our priority.  As part of our continuing mission to provide you with exceptional heart care, we have created designated Provider Care Teams.  These Care Teams include your primary Cardiologist (physician) and Advanced Practice Providers (APPs -  Physician Assistants and Nurse Practitioners) who all work together to provide you with the care you need, when you need it.  Your next appointment:   4-6 week(s)  The format for your next appointment:   In Person  Provider:   Mertie Moores, MD   DASH Eating Plan [place] stands for "Dietary Approaches to Stop Hypertension." The DASH eating plan is a healthy eating plan that has been shown to reduce high blood pressure (hypertension). It may also reduce your risk for type 2 diabetes, heart disease, and stroke. The DASH eating plan may also help with weight loss. What are tips  for following this plan?  General guidelines  Avoid eating more than 2,300 mg (milligrams) of salt (sodium) a day. If you have hypertension, you may need to reduce your sodium intake to 1,500 mg a day.  Limit alcohol intake to no more than 1 drink a day for nonpregnant women and 2 drinks a day for men. One drink equals 12 oz of beer, 5 oz of wine, or 1 oz of hard liquor.  Work with your health care provider to maintain a healthy body weight or to lose weight. Ask what an ideal weight is for you.  Get at least 30 minutes of exercise that causes your heart to beat faster (aerobic exercise) most days of the week. Activities may include walking, swimming, or biking.  Work with your health care provider or diet and nutrition specialist (dietitian) to adjust your eating plan to your individual calorie needs. Reading food labels   Check food labels for the amount of sodium per serving. Choose foods with less than 5 percent of the Daily Value of sodium. Generally, foods with less than 300 mg of sodium per serving fit into this eating plan.  To find whole grains, look for the word "whole" as the first word in the ingredient list. Shopping  Buy products labeled as "low-sodium" or "no salt added."  Buy fresh foods. Avoid canned foods and premade or frozen meals. Cooking  Avoid adding salt when cooking. Use salt-free seasonings or herbs instead of table salt or sea salt. Check with your health care provider or pharmacist before using salt substitutes.  Do not  fry foods. Cook foods using healthy methods such as baking, boiling, grilling, and broiling instead.  Cook with heart-healthy oils, such as olive, canola, soybean, or sunflower oil. Meal planning  Eat a balanced diet that includes: ? 5 or more servings of fruits and vegetables each day. At each meal, try to fill half of your plate with fruits and vegetables. ? Up to 6-8 servings of whole grains each day. ? Less than 6 oz of lean meat,  poultry, or fish each day. A 3-oz serving of meat is about the same size as a deck of cards. One egg equals 1 oz. ? 2 servings of low-fat dairy each day. ? A serving of nuts, seeds, or beans 5 times each week. ? Heart-healthy fats. Healthy fats called Omega-3 fatty acids are found in foods such as flaxseeds and coldwater fish, like sardines, salmon, and mackerel.  Limit how much you eat of the following: ? Canned or prepackaged foods. ? Food that is high in trans fat, such as fried foods. ? Food that is high in saturated fat, such as fatty meat. ? Sweets, desserts, sugary drinks, and other foods with added sugar. ? Full-fat dairy products.  Do not salt foods before eating.  Try to eat at least 2 vegetarian meals each week.  Eat more home-cooked food and less restaurant, buffet, and fast food.  When eating at a restaurant, ask that your food be prepared with less salt or no salt, if possible. What foods are recommended? The items listed may not be a complete list. Talk with your dietitian about what dietary choices are best for you. Grains Whole-grain or whole-wheat bread. Whole-grain or whole-wheat pasta. Brown rice. [place]. Bulgur. Whole-grain and low-sodium cereals. Pita bread. Low-fat, low-sodium crackers. Whole-wheat flour tortillas. Vegetables Fresh or frozen vegetables (raw, steamed, roasted, or grilled). Low-sodium or reduced-sodium tomato and vegetable juice. Low-sodium or reduced-sodium tomato sauce and tomato paste. Low-sodium or reduced-sodium canned vegetables. Fruits All fresh, dried, or frozen fruit. Canned fruit in natural juice (without added sugar). Meat and other protein foods Skinless chicken or [place]. Ground chicken or [place]. Pork with fat trimmed off. Fish and seafood. Egg whites. Dried beans, peas, or lentils. Unsalted nuts, nut butters, and seeds. Unsalted canned beans. Lean cuts of beef with fat trimmed off. Low-sodium, lean deli meat. Dairy Low-fat  (1%) or fat-free (skim) milk. Fat-free, low-fat, or reduced-fat cheeses. Nonfat, low-sodium ricotta or cottage cheese. Low-fat or nonfat yogurt. Low-fat, low-sodium cheese. Fats and oils Soft margarine without trans fats. Vegetable oil. Low-fat, reduced-fat, or light mayonnaise and salad dressings (reduced-sodium). Canola, safflower, olive, soybean, and sunflower oils. Avocado. Seasoning and other foods Herbs. Spices. Seasoning mixes without salt. Unsalted popcorn and pretzels. Fat-free sweets. What foods are not recommended? The items listed may not be a complete list. Talk with your dietitian about what dietary choices are best for you. Grains Baked goods made with fat, such as croissants, muffins, or some breads. Dry pasta or rice meal packs. Vegetables Creamed or fried vegetables. Vegetables in a cheese sauce. Regular canned vegetables (not low-sodium or reduced-sodium). Regular canned tomato sauce and paste (not low-sodium or reduced-sodium). Regular tomato and vegetable juice (not low-sodium or reduced-sodium). Angie Fava. Olives. Fruits Canned fruit in a light or heavy syrup. Fried fruit. Fruit in cream or butter sauce. Meat and other protein foods Fatty cuts of meat. Ribs. Fried meat. Berniece Salines. Sausage. Bologna and other processed lunch meats. Salami. Fatback. Hotdogs. Bratwurst. Salted nuts and seeds. Canned beans with added salt.  Canned or smoked fish. Whole eggs or egg yolks. Chicken or [place] with skin. Dairy Whole or 2% milk, cream, and half-and-half. Whole or full-fat cream cheese. Whole-fat or sweetened yogurt. Full-fat cheese. Nondairy creamers. Whipped toppings. Processed cheese and cheese spreads. Fats and oils Butter. Stick margarine. Lard. Shortening. Ghee. Bacon fat. Tropical oils, such as coconut, palm kernel, or palm oil. Seasoning and other foods Salted popcorn and pretzels. Onion salt, garlic salt, seasoned salt, table salt, and sea salt. Worcestershire sauce. Tartar sauce.  Barbecue sauce. Teriyaki sauce. Soy sauce, including reduced-sodium. Steak sauce. Canned and packaged gravies. Fish sauce. Oyster sauce. Cocktail sauce. Horseradish that you find on the shelf. Ketchup. Mustard. Meat flavorings and tenderizers. Bouillon cubes. Hot sauce and Tabasco sauce. Premade or packaged marinades. Premade or packaged taco seasonings. Relishes. Regular salad dressings. Where to find more information:  National Heart, Lung, and [place]: https://wilson-eaton.com/  American Heart Association: www.heart.org Summary  The DASH eating plan is a healthy eating plan that has been shown to reduce high blood pressure (hypertension). It may also reduce your risk for type 2 diabetes, heart disease, and stroke.  With the DASH eating plan, you should limit salt (sodium) intake to 2,300 mg a day. If you have hypertension, you may need to reduce your sodium intake to 1,500 mg a day.  When on the DASH eating plan, aim to eat more fresh fruits and vegetables, whole grains, lean proteins, low-fat dairy, and heart-healthy fats.  Work with your health care provider or diet and nutrition specialist (dietitian) to adjust your eating plan to your individual calorie needs. This information is not intended to replace advice given to you by your health care provider. Make sure you discuss any questions you have with your health care provider. Document Revised: 05/13/2017 Document Reviewed: 05/24/2016 Elsevier Patient Education  2020 [place] refers to food and lifestyle choices that are based on the traditions of countries located on the [employer]. This way of eating has been shown to help prevent certain conditions and improve outcomes for people who have chronic diseases, like kidney disease and heart disease. What are tips for following this plan? Lifestyle  Cook and eat meals together with your family, when possible.  Drink enough fluid  to keep your urine clear or pale yellow.  Be physically active every day. This includes: ? Aerobic exercise like running or swimming. ? Leisure activities like gardening, walking, or housework.  Get 7-8 hours of sleep each night.  If recommended by your health care provider, drink red wine in moderation. This means 1 glass a day for nonpregnant women and 2 glasses a day for men. A glass of wine equals 5 oz (150 mL). Reading food labels   Check the serving size of packaged foods. For foods such as rice and pasta, the serving size refers to the amount of cooked product, not dry.  Check the total fat in packaged foods. Avoid foods that have saturated fat or trans fats.  Check the ingredients list for added sugars, such as corn syrup. Shopping  At the grocery store, buy most of your food from the areas near the walls of the store. This includes: ? Fresh fruits and vegetables (produce). ? Grains, beans, nuts, and seeds. Some of these may be available in unpackaged forms or large amounts (in bulk). ? Fresh seafood. ? Poultry and eggs. ? Low-fat dairy products.  Buy whole ingredients instead of prepackaged foods.  Buy fresh  fruits and vegetables in-season from local farmers markets.  Buy frozen fruits and vegetables in resealable bags.  If you do not have access to quality fresh seafood, buy precooked frozen shrimp or canned fish, such as tuna, salmon, or sardines.  Buy small amounts of raw or cooked vegetables, salads, or olives from the deli or salad bar at your store.  Stock your pantry so you always have certain foods on hand, such as olive oil, canned tuna, canned tomatoes, rice, pasta, and beans. Cooking  Cook foods with extra-virgin olive oil instead of using butter or other vegetable oils.  Have meat as a side dish, and have vegetables or grains as your main dish. This means having meat in small portions or adding small amounts of meat to foods like pasta or stew.  Use  beans or vegetables instead of meat in common dishes like chili or lasagna.  Experiment with different cooking methods. Try roasting or broiling vegetables instead of steaming or sauteing them.  Add frozen vegetables to soups, stews, pasta, or rice.  Add nuts or seeds for added healthy fat at each meal. You can add these to yogurt, salads, or vegetable dishes.  Marinate fish or vegetables using olive oil, lemon juice, garlic, and fresh herbs. Meal planning   Plan to eat 1 vegetarian meal one day each week. Try to work up to 2 vegetarian meals, if possible.  Eat seafood 2 or more times a week.  Have healthy snacks readily available, such as: ? Vegetable sticks with hummus. ? [place] yogurt. ? Fruit and nut trail mix.  Eat balanced meals throughout the week. This includes: ? Fruit: 2-3 servings a day ? Vegetables: 4-5 servings a day ? Low-fat dairy: 2 servings a day ? Fish, poultry, or lean meat: 1 serving a day ? Beans and legumes: 2 or more servings a week ? Nuts and seeds: 1-2 servings a day ? Whole grains: 6-8 servings a day ? Extra-virgin olive oil: 3-4 servings a day  Limit red meat and sweets to only a few servings a month What are my food choices?  Mediterranean diet ? Recommended  Grains: Whole-grain pasta. Brown rice. Bulgar wheat. Polenta. Couscous. Whole-wheat bread. [place].  Vegetables: Artichokes. Beets. Broccoli. Cabbage. Carrots. Eggplant. Green beans. Chard. Kale. Spinach. Onions. Leeks. Peas. Squash. Tomatoes. Peppers. Radishes.  Fruits: Apples. Apricots. Avocado. Berries. Bananas. Cherries. Dates. Figs. Grapes. Lemons. Melon. Oranges. Peaches. Plums. Pomegranate.  Meats and other protein foods: Beans. Almonds. Sunflower seeds. Pine nuts. Peanuts. Funk. Salmon. Scallops. Shrimp. [place]. Tilapia. Clams. Oysters. Eggs.  Dairy: Low-fat milk. Cheese. Greek yogurt.  Beverages: Water. Red wine. Herbal tea.  Fats and oils: Extra virgin olive oil. Avocado  oil. Grape seed oil.  Sweets and desserts: [place] yogurt with honey. Baked apples. Poached pears. Trail mix.  Seasoning and other foods: Basil. Cilantro. Coriander. Cumin. Mint. Parsley. Sage. Rosemary. Tarragon. Garlic. Oregano. Thyme. Pepper. Balsalmic vinegar. Tahini. Hummus. Tomato sauce. Olives. Mushrooms. ? Limit these  Grains: Prepackaged pasta or rice dishes. Prepackaged cereal with added sugar.  Vegetables: Deep fried potatoes (french fries).  Fruits: Fruit canned in syrup.  Meats and other protein foods: Beef. Pork. Lamb. Poultry with skin. Hot dogs. Berniece Salines.  Dairy: Ice cream. Sour cream. Whole milk.  Beverages: Juice. Sugar-sweetened soft drinks. Beer. Liquor and spirits.  Fats and oils: Butter. Canola oil. Vegetable oil. Beef fat (tallow). Lard.  Sweets and desserts: Cookies. Cakes. Pies. Candy.  Seasoning and other foods: Mayonnaise. Premade sauces and marinades. The items  listed may not be a complete list. Talk with your dietitian about what dietary choices are right for you. Summary  The Mediterranean diet includes both food and lifestyle choices.  Eat a variety of fresh fruits and vegetables, beans, nuts, seeds, and whole grains.  Limit the amount of red meat and sweets that you eat.  Talk with your health care provider about whether it is safe for you to drink red wine in moderation. This means 1 glass a day for nonpregnant women and 2 glasses a day for men. A glass of wine equals 5 oz (150 mL). This information is not intended to replace advice given to you by your health care provider. Make sure you discuss any questions you have with your health care provider. Document Revised: 01/29/2016 Document Reviewed: 01/22/2016 Elsevier Patient Education  [place].

## 2019-08-06 NOTE — Progress Notes (Signed)
Cardiology Office Note:    Date:  08/06/2019   ID:  Nancy [name], DOB 09/04/1955, MRN [id number]  PCP:  Nancy Koch, NP  Cardiologist:  Nancy [name]  Electrophysiologist:  None   Referring MD: Nancy Koch, NP   Problem List 1. Anxiety:  2.  Hyperlipidemia  3.  Hypertension   Chief Complaint  Patient presents with  . Hyperlipidemia  . Hypertension    History of Present Illness:    Nancy [name] is a 64 y.o. female with a hx of anxiety ,   ? HTN. We were asked to see her by Nancy Friendly, NP for further evaluation of an episode of face tingling and HTN.  Mother of Nancy [name] ( has premature CAD )    Has had some tingling on the side of her face.   Became very anxious Called EMS,  BP was very elevated.  She tried amlopidine .  She had recurrent episodes of marked HTN and face tingling .  BP was 220   Nancy [name] changed the amlodipine to Lisinopril 10 mg BID  BP continued to spike  Was started on metoprolol   Admits that she does not watch her salt .  Eats fast foods frequently for lunch  Dinner  Typically fried or salty foods.      Past Medical History:  Diagnosis Date  . Fatigue   . Hyperlipidemia   . Lichen sclerosus   . Routine general medical examination at a health care facility     Past Surgical History:  Procedure Laterality Date  . ANTERIOR CERVICAL DECOMP/DISCECTOMY FUSION    . PARTIAL HYSTERECTOMY    . TUBAL LIGATION      Current Medications: Current Meds  Medication Sig  . cholecalciferol (VITAMIN D3) 25 MCG (1000 UT) tablet Take 1,000 Units by mouth daily.  . clobetasol cream (TEMOVATE) [id number] % Apply 1 application topically as needed.  . diphenhydrAMINE HCl (BENADRYL ALLERGY PO) Take 2 capsules by mouth 2 (two) times daily as needed (For sinus congestion).   Marland Kitchen escitalopram (LEXAPRO) 10 MG tablet Take 1 tablet (10 mg total) by mouth daily. For anxiety.  . hydrOXYzine (VISTARIL) 25 MG capsule Take 1 capsule (25 mg total)  by mouth 3 (three) times daily as needed for anxiety.  Marland Kitchen lisinopril (ZESTRIL) 10 MG tablet Take 1 tablet (10 mg total) by mouth 2 (two) times daily. For blood pressure.  . loratadine (CLARITIN) 10 MG tablet Take 10 mg by mouth daily as needed for allergies.  . metoprolol tartrate (LOPRESSOR) 25 MG tablet Take 1 tablet (25 mg total) by mouth 2 (two) times daily. With a meal for blood pressure and heart rate.  . rosuvastatin (CRESTOR) 5 MG tablet Take 1 tablet (5 mg total) by mouth every evening. For cholesterol.     Allergies:   Statins   Social History   Socioeconomic History  . Marital status: Single    Spouse name: Not on file  . Number of children: 2  . Years of education: Not on file  . Highest education level: Not on file  Occupational History  . Not on file  Tobacco Use  . Smoking status: Never Smoker  . Smokeless tobacco: Never Used  Substance and Sexual Activity  . Alcohol use: Yes    Alcohol/week: 0.0 standard drinks    Comment: occ  . Drug use: No  . Sexual activity: Not on file  Other Topics Concern  . Not on file  Social History  Narrative   Divorced.  G3P2.   Regular exercise-yes   Social Determinants of Health   Financial Resource Strain:   . Difficulty of Paying Living Expenses: Not on file  Food Insecurity:   . Worried About Charity fundraiser in the Last Year: Not on file  . Ran Out of Food in the Last Year: Not on file  Transportation Needs:   . Lack of Transportation (Medical): Not on file  . Lack of Transportation (Non-Medical): Not on file  Physical Activity:   . Days of Exercise per Week: Not on file  . Minutes of Exercise per Session: Not on file  Stress:   . Feeling of Stress : Not on file  Social Connections:   . Frequency of Communication with Friends and Family: Not on file  . Frequency of Social Gatherings with Friends and Family: Not on file  . Attends Religious Services: Not on file  . Active Member of Clubs or Organizations: Not on  file  . Attends Archivist Meetings: Not on file  . Marital Status: Not on file     Family History: The patient's family history includes Diabetes in her father; Hydrocephalus in her mother; Hypertension in her father; Neuropathy in her mother; Thyroid cancer in her cousin; Thyroid disease in her maternal aunt.  ROS:   Please see the history of present illness.     All other systems reviewed and are negative.  EKGs/Labs/Other Studies Reviewed:    The following studies were reviewed today:   EKG:   August 06, 2019: Normal sinus rhythm at 75.  Nonspecific ST and T wave abnormalities.  Recent Labs: 07/19/2019: BUN 12; Creatinine, Ser 0.88; Hemoglobin 13.3; Platelets 255; Potassium 3.9; Sodium 139 07/31/2019: TSH 0.62  Recent Lipid Panel    Component Value Date/Time   CHOL 264 (H) 04/11/2018 0841   TRIG 90.0 04/11/2018 0841   HDL 61.20 04/11/2018 0841   CHOLHDL 4 04/11/2018 0841   VLDL 18.0 04/11/2018 0841   LDLCALC 185 (H) 04/11/2018 0841   LDLDIRECT 197.5 11/02/2011 0819    Physical Exam:    VS:  BP (!) 154/82   Pulse 75   Ht 5\' 2"  (1.575 m)   Wt 147 lb 8 oz (66.9 kg)   SpO2 99%   BMI 26.98 kg/m     Wt Readings from Last 3 Encounters:  08/06/19 147 lb 8 oz (66.9 kg)  07/31/19 149 lb 12 oz (67.9 kg)  07/22/19 150 lb (68 kg)     GEN:  Well nourished, well developed in no acute distress HEENT: Normal NECK: No JVD; No carotid bruits LYMPHATICS: No lymphadenopathy CARDIAC: RRR, no murmurs, rubs, gallops RESPIRATORY:  Clear to auscultation without rales, wheezing or rhonchi  ABDOMEN: Soft, non-tender, non-distended MUSCULOSKELETAL:  No edema; No deformity  SKIN: Warm and dry NEUROLOGIC:  Alert and oriented x 3 PSYCHIATRIC:  Normal affect   ASSESSMENT:    1. Essential hypertension   2. Hyperlipidemia, unspecified hyperlipidemia type   3. Overweight (BMI 25.0-29.9)    PLAN:    In order of problems listed above:  1. Hyperlipidemia: Nancy [name] has  history of significant hyperlipidemia.  Both of her sons had premature coronary artery disease at a young age.  We will draw repeat labs today.  She has not tolerated statins in the past.  Anticipate starting her on a PCSK9 inhibitor.  Will refer to lipid clinic.  I would like to get a coronary calcium score for screening.  She  has unusual symptoms of tingling in her face but no episodes of angina that I can elicit at this time.   2.  Hypertension: It is clear that she eats a very high salt diet.  Her blood pressure is close to normal on occasion and then will skyrocket up to 200 on other occasions.  It is clear that she had some very high salt meals.  We had a long discussion about low-salt eating.  We will provide her with a low-salt diet.  We will add chlorthalidone 12.5 mg a day and potassium chloride 10 mEq twice a day.  I will see her in 4 to 6 weeks for follow-up visit.  3.  Anxiety: She will continue to follow-up with Allie Bossier, NP.   Medication Adjustments/Labs and Tests Ordered: Current medicines are reviewed at length with the patient today.  Concerns regarding medicines are outlined above.  Orders Placed This Encounter  Procedures  . CT CARDIAC SCORING  . Lipid Profile  . Hepatic function panel  . Basic Metabolic Panel (BMET)  . EKG 12-Lead   Meds ordered this encounter  Medications  . chlorthalidone (HYGROTON) 25 MG tablet    Sig: Take 0.5 tablets (12.5 mg total) by mouth daily.    Dispense:  15 tablet    Refill:  11  . potassium chloride (KLOR-CON) 10 MEQ tablet    Sig: Take 1 tablet (10 mEq total) by mouth daily.    Dispense:  30 tablet    Refill:  11     Patient Instructions  Medication Instructions:  Your physician has recommended you make the following change in your medication:  START Chlorthalidone (Hygroton) 12.5 mg once daily START Kdur (potassium chloride) 10 mEq once daily  *If you need a refill on your cardiac medications before your next  appointment, please call your pharmacy*  Lab Work: TODAY - cholesterol, liver panel, basic metabolic panel If you have labs (blood work) drawn today and your tests are completely normal, you will receive your results only by: Marland Kitchen MyChart Message (if you have MyChart) OR . A paper copy in the mail If you have any lab test that is abnormal or we need to change your treatment, we will call you to review the results.   Testing/Procedures: Non-Cardiac CT scanning, for Coronary Calcium Score, is a noninvasive, special x-ray that produces cross-sectional images of the body using x-rays and a computer. CT scans help physicians diagnose and treat medical conditions. CT scans provide greater clarity and reveal more details than regular x-ray exams.    Follow-Up: At [facility], you and your health needs are our priority.  As part of our continuing mission to provide you with exceptional heart care, we have created designated Provider Care Teams.  These Care Teams include your primary Cardiologist (physician) and Advanced Practice Providers (APPs -  Physician Assistants and Nurse Practitioners) who all work together to provide you with the care you need, when you need it.  Your next appointment:   4-6 week(s)  The format for your next appointment:   In Person  Provider:   Mertie Moores, MD   DASH Eating Plan [place] stands for "Dietary Approaches to Stop Hypertension." The DASH eating plan is a healthy eating plan that has been shown to reduce high blood pressure (hypertension). It may also reduce your risk for type 2 diabetes, heart disease, and stroke. The DASH eating plan may also help with weight loss. What are tips for following this plan?  General  guidelines  Avoid eating more than 2,300 mg (milligrams) of salt (sodium) a day. If you have hypertension, you may need to reduce your sodium intake to 1,500 mg a day.  Limit alcohol intake to no more than 1 drink a day for nonpregnant women  and 2 drinks a day for men. One drink equals 12 oz of beer, 5 oz of wine, or 1 oz of hard liquor.  Work with your health care provider to maintain a healthy body weight or to lose weight. Ask what an ideal weight is for you.  Get at least 30 minutes of exercise that causes your heart to beat faster (aerobic exercise) most days of the week. Activities may include walking, swimming, or biking.  Work with your health care provider or diet and nutrition specialist (dietitian) to adjust your eating plan to your individual calorie needs. Reading food labels   Check food labels for the amount of sodium per serving. Choose foods with less than 5 percent of the Daily Value of sodium. Generally, foods with less than 300 mg of sodium per serving fit into this eating plan.  To find whole grains, look for the word "whole" as the first word in the ingredient list. Shopping  Buy products labeled as "low-sodium" or "no salt added."  Buy fresh foods. Avoid canned foods and premade or frozen meals. Cooking  Avoid adding salt when cooking. Use salt-free seasonings or herbs instead of table salt or sea salt. Check with your health care provider or pharmacist before using salt substitutes.  Do not fry foods. Cook foods using healthy methods such as baking, boiling, grilling, and broiling instead.  Cook with heart-healthy oils, such as olive, canola, soybean, or sunflower oil. Meal planning  Eat a balanced diet that includes: ? 5 or more servings of fruits and vegetables each day. At each meal, try to fill half of your plate with fruits and vegetables. ? Up to 6-8 servings of whole grains each day. ? Less than 6 oz of lean meat, poultry, or fish each day. A 3-oz serving of meat is about the same size as a deck of cards. One egg equals 1 oz. ? 2 servings of low-fat dairy each day. ? A serving of nuts, seeds, or beans 5 times each week. ? Heart-healthy fats. Healthy fats called Omega-3 fatty acids are  found in foods such as flaxseeds and coldwater fish, like sardines, salmon, and mackerel.  Limit how much you eat of the following: ? Canned or prepackaged foods. ? Food that is high in trans fat, such as fried foods. ? Food that is high in saturated fat, such as fatty meat. ? Sweets, desserts, sugary drinks, and other foods with added sugar. ? Full-fat dairy products.  Do not salt foods before eating.  Try to eat at least 2 vegetarian meals each week.  Eat more home-cooked food and less restaurant, buffet, and fast food.  When eating at a restaurant, ask that your food be prepared with less salt or no salt, if possible. What foods are recommended? The items listed may not be a complete list. Talk with your dietitian about what dietary choices are best for you. Grains Whole-grain or whole-wheat bread. Whole-grain or whole-wheat pasta. Brown rice. [place]. Bulgur. Whole-grain and low-sodium cereals. Pita bread. Low-fat, low-sodium crackers. Whole-wheat flour tortillas. Vegetables Fresh or frozen vegetables (raw, steamed, roasted, or grilled). Low-sodium or reduced-sodium tomato and vegetable juice. Low-sodium or reduced-sodium tomato sauce and tomato paste. Low-sodium or reduced-sodium canned  vegetables. Fruits All fresh, dried, or frozen fruit. Canned fruit in natural juice (without added sugar). Meat and other protein foods Skinless chicken or [place]. Ground chicken or [place]. Pork with fat trimmed off. Fish and seafood. Egg whites. Dried beans, peas, or lentils. Unsalted nuts, nut butters, and seeds. Unsalted canned beans. Lean cuts of beef with fat trimmed off. Low-sodium, lean deli meat. Dairy Low-fat (1%) or fat-free (skim) milk. Fat-free, low-fat, or reduced-fat cheeses. Nonfat, low-sodium ricotta or cottage cheese. Low-fat or nonfat yogurt. Low-fat, low-sodium cheese. Fats and oils Soft margarine without trans fats. Vegetable oil. Low-fat, reduced-fat, or light mayonnaise  and salad dressings (reduced-sodium). Canola, safflower, olive, soybean, and sunflower oils. Avocado. Seasoning and other foods Herbs. Spices. Seasoning mixes without salt. Unsalted popcorn and pretzels. Fat-free sweets. What foods are not recommended? The items listed may not be a complete list. Talk with your dietitian about what dietary choices are best for you. Grains Baked goods made with fat, such as croissants, muffins, or some breads. Dry pasta or rice meal packs. Vegetables Creamed or fried vegetables. Vegetables in a cheese sauce. Regular canned vegetables (not low-sodium or reduced-sodium). Regular canned tomato sauce and paste (not low-sodium or reduced-sodium). Regular tomato and vegetable juice (not low-sodium or reduced-sodium). Angie Fava. Olives. Fruits Canned fruit in a light or heavy syrup. Fried fruit. Fruit in cream or butter sauce. Meat and other protein foods Fatty cuts of meat. Ribs. Fried meat. Berniece Salines. Sausage. Bologna and other processed lunch meats. Salami. Fatback. Hotdogs. Bratwurst. Salted nuts and seeds. Canned beans with added salt. Canned or smoked fish. Whole eggs or egg yolks. Chicken or [place] with skin. Dairy Whole or 2% milk, cream, and half-and-half. Whole or full-fat cream cheese. Whole-fat or sweetened yogurt. Full-fat cheese. Nondairy creamers. Whipped toppings. Processed cheese and cheese spreads. Fats and oils Butter. Stick margarine. Lard. Shortening. Ghee. Bacon fat. Tropical oils, such as coconut, palm kernel, or palm oil. Seasoning and other foods Salted popcorn and pretzels. Onion salt, garlic salt, seasoned salt, table salt, and sea salt. Worcestershire sauce. Tartar sauce. Barbecue sauce. Teriyaki sauce. Soy sauce, including reduced-sodium. Steak sauce. Canned and packaged gravies. Fish sauce. Oyster sauce. Cocktail sauce. Horseradish that you find on the shelf. Ketchup. Mustard. Meat flavorings and tenderizers. Bouillon cubes. Hot sauce and Tabasco  sauce. Premade or packaged marinades. Premade or packaged taco seasonings. Relishes. Regular salad dressings. Where to find more information:  National Heart, Lung, and [place]: https://wilson-eaton.com/  American Heart Association: www.heart.org Summary  The DASH eating plan is a healthy eating plan that has been shown to reduce high blood pressure (hypertension). It may also reduce your risk for type 2 diabetes, heart disease, and stroke.  With the DASH eating plan, you should limit salt (sodium) intake to 2,300 mg a day. If you have hypertension, you may need to reduce your sodium intake to 1,500 mg a day.  When on the DASH eating plan, aim to eat more fresh fruits and vegetables, whole grains, lean proteins, low-fat dairy, and heart-healthy fats.  Work with your health care provider or diet and nutrition specialist (dietitian) to adjust your eating plan to your individual calorie needs. This information is not intended to replace advice given to you by your health care provider. Make sure you discuss any questions you have with your health care provider. Document Revised: 05/13/2017 Document Reviewed: 05/24/2016 Elsevier Patient Education  2020 [place] refers to food and lifestyle choices that are based on  the traditions of countries located on the [employer]. This way of eating has been shown to help prevent certain conditions and improve outcomes for people who have chronic diseases, like kidney disease and heart disease. What are tips for following this plan? Lifestyle  Cook and eat meals together with your family, when possible.  Drink enough fluid to keep your urine clear or pale yellow.  Be physically active every day. This includes: ? Aerobic exercise like running or swimming. ? Leisure activities like gardening, walking, or housework.  Get 7-8 hours of sleep each night.  If recommended by your health care provider,  drink red wine in moderation. This means 1 glass a day for nonpregnant women and 2 glasses a day for men. A glass of wine equals 5 oz (150 mL). Reading food labels   Check the serving size of packaged foods. For foods such as rice and pasta, the serving size refers to the amount of cooked product, not dry.  Check the total fat in packaged foods. Avoid foods that have saturated fat or trans fats.  Check the ingredients list for added sugars, such as corn syrup. Shopping  At the grocery store, buy most of your food from the areas near the walls of the store. This includes: ? Fresh fruits and vegetables (produce). ? Grains, beans, nuts, and seeds. Some of these may be available in unpackaged forms or large amounts (in bulk). ? Fresh seafood. ? Poultry and eggs. ? Low-fat dairy products.  Buy whole ingredients instead of prepackaged foods.  Buy fresh fruits and vegetables in-season from local farmers markets.  Buy frozen fruits and vegetables in resealable bags.  If you do not have access to quality fresh seafood, buy precooked frozen shrimp or canned fish, such as tuna, salmon, or sardines.  Buy small amounts of raw or cooked vegetables, salads, or olives from the deli or salad bar at your store.  Stock your pantry so you always have certain foods on hand, such as olive oil, canned tuna, canned tomatoes, rice, pasta, and beans. Cooking  Cook foods with extra-virgin olive oil instead of using butter or other vegetable oils.  Have meat as a side dish, and have vegetables or grains as your main dish. This means having meat in small portions or adding small amounts of meat to foods like pasta or stew.  Use beans or vegetables instead of meat in common dishes like chili or lasagna.  Experiment with different cooking methods. Try roasting or broiling vegetables instead of steaming or sauteing them.  Add frozen vegetables to soups, stews, pasta, or rice.  Add nuts or seeds for added  healthy fat at each meal. You can add these to yogurt, salads, or vegetable dishes.  Marinate fish or vegetables using olive oil, lemon juice, garlic, and fresh herbs. Meal planning   Plan to eat 1 vegetarian meal one day each week. Try to work up to 2 vegetarian meals, if possible.  Eat seafood 2 or more times a week.  Have healthy snacks readily available, such as: ? Vegetable sticks with hummus. ? [place] yogurt. ? Fruit and nut trail mix.  Eat balanced meals throughout the week. This includes: ? Fruit: 2-3 servings a day ? Vegetables: 4-5 servings a day ? Low-fat dairy: 2 servings a day ? Fish, poultry, or lean meat: 1 serving a day ? Beans and legumes: 2 or more servings a week ? Nuts and seeds: 1-2 servings a day ? Whole grains: 6-8 servings a  day ? Extra-virgin olive oil: 3-4 servings a day  Limit red meat and sweets to only a few servings a month What are my food choices?  Mediterranean diet ? Recommended  Grains: Whole-grain pasta. Brown rice. Bulgar wheat. Polenta. Couscous. Whole-wheat bread. [place].  Vegetables: Artichokes. Beets. Broccoli. Cabbage. Carrots. Eggplant. Green beans. Chard. Kale. Spinach. Onions. Leeks. Peas. Squash. Tomatoes. Peppers. Radishes.  Fruits: Apples. Apricots. Avocado. Berries. Bananas. Cherries. Dates. Figs. Grapes. Lemons. Melon. Oranges. Peaches. Plums. Pomegranate.  Meats and other protein foods: Beans. Almonds. Sunflower seeds. Pine nuts. Peanuts. [employer]. Salmon. Scallops. Shrimp. [place]. Tilapia. Clams. Oysters. Eggs.  Dairy: Low-fat milk. Cheese. Greek yogurt.  Beverages: Water. Red wine. Herbal tea.  Fats and oils: Extra virgin olive oil. Avocado oil. Grape seed oil.  Sweets and desserts: [place] yogurt with honey. Baked apples. Poached pears. Trail mix.  Seasoning and other foods: Basil. Cilantro. Coriander. Cumin. Mint. Parsley. Sage. Rosemary. Tarragon. Garlic. Oregano. Thyme. Pepper. Balsalmic vinegar. Tahini. Hummus.  Tomato sauce. Olives. Mushrooms. ? Limit these  Grains: Prepackaged pasta or rice dishes. Prepackaged cereal with added sugar.  Vegetables: Deep fried potatoes (french fries).  Fruits: Fruit canned in syrup.  Meats and other protein foods: Beef. Pork. Lamb. Poultry with skin. Hot dogs. Berniece Salines.  Dairy: Ice cream. Sour cream. Whole milk.  Beverages: Juice. Sugar-sweetened soft drinks. Beer. Liquor and spirits.  Fats and oils: Butter. Canola oil. Vegetable oil. Beef fat (tallow). Lard.  Sweets and desserts: Cookies. Cakes. Pies. Candy.  Seasoning and other foods: Mayonnaise. Premade sauces and marinades. The items listed may not be a complete list. Talk with your dietitian about what dietary choices are right for you. Summary  The Mediterranean diet includes both food and lifestyle choices.  Eat a variety of fresh fruits and vegetables, beans, nuts, seeds, and whole grains.  Limit the amount of red meat and sweets that you eat.  Talk with your health care provider about whether it is safe for you to drink red wine in moderation. This means 1 glass a day for nonpregnant women and 2 glasses a day for men. A glass of wine equals 5 oz (150 mL). This information is not intended to replace advice given to you by your health care provider. Make sure you discuss any questions you have with your health care provider. Document Revised: 01/29/2016 Document Reviewed: 01/22/2016 Elsevier Patient Education  2020 [place], Mertie Moores, MD  08/06/2019 5:28 PM    [place]

## 2019-08-07 ENCOUNTER — Encounter: Payer: Self-pay | Admitting: Primary Care

## 2019-08-07 ENCOUNTER — Telehealth: Payer: Self-pay | Admitting: Nurse Practitioner

## 2019-08-07 ENCOUNTER — Ambulatory Visit (INDEPENDENT_AMBULATORY_CARE_PROVIDER_SITE_OTHER): Payer: BLUE CROSS/BLUE SHIELD | Admitting: Primary Care

## 2019-08-07 DIAGNOSIS — E785 Hyperlipidemia, unspecified: Secondary | ICD-10-CM

## 2019-08-07 DIAGNOSIS — F411 Generalized anxiety disorder: Secondary | ICD-10-CM | POA: Diagnosis not present

## 2019-08-07 DIAGNOSIS — I10 Essential (primary) hypertension: Secondary | ICD-10-CM

## 2019-08-07 LAB — LIPID PANEL
Chol/HDL Ratio: 2.7 ratio (ref 0.0–4.4)
Cholesterol, Total: 195 mg/dL (ref 100–199)
HDL: 71 mg/dL (ref >39)
LDL Chol Calc (NIH): 104 mg/dL — ABNORMAL HIGH (ref 0–99)
Triglycerides: 115 mg/dL (ref 0–149)
VLDL Cholesterol Cal: 20 mg/dL (ref 5–40)

## 2019-08-07 LAB — HEPATIC FUNCTION PANEL
ALT: 13 IU/L (ref 0–32)
AST: 15 IU/L (ref 0–40)
Albumin: 4.8 g/dL (ref 3.8–4.8)
Alkaline Phosphatase: 85 IU/L (ref 39–117)
Bilirubin Total: 0.5 mg/dL (ref 0.0–1.2)
Bilirubin, Direct: 0.13 mg/dL (ref 0.00–0.40)
Total Protein: 7.3 g/dL (ref 6.0–8.5)

## 2019-08-07 LAB — BASIC METABOLIC PANEL
BUN/Creatinine Ratio: 13 (ref 12–28)
BUN: 10 mg/dL (ref 8–27)
CO2: 24 mmol/L (ref 20–29)
Calcium: 9.6 mg/dL (ref 8.7–10.3)
Chloride: 102 mmol/L (ref 96–106)
Creatinine, Ser: 0.75 mg/dL (ref 0.57–1.00)
GFR calc Af Amer: 97 mL/min/{1.73_m2} (ref >59)
GFR calc non Af Amer: 85 mL/min/{1.73_m2} (ref >59)
Glucose: 97 mg/dL (ref 65–99)
Potassium: 4.3 mmol/L (ref 3.5–5.2)
Sodium: 142 mmol/L (ref 134–144)

## 2019-08-07 MED ORDER — ROSUVASTATIN CALCIUM 5 MG PO TABS: 5.0000 mg | ORAL_TABLET | ORAL | 3 refills | Status: DC

## 2019-08-07 NOTE — Patient Instructions (Addendum)
Continue lisinopril 10 mg twice daily and metoprolol tartrate 25 mg twice daily for blood pressure.  Monitor your blood pressure at home as discussed. Start the chlorthalidone and potassium in a few days as discussed.  Continue rosuvastatin 5 mg every evening for cholesterol.  It was a pleasure to see you today!

## 2019-08-07 NOTE — Telephone Encounter (Signed)
I agree with the note and plan by Christen Bame, RN

## 2019-08-07 NOTE — Telephone Encounter (Signed)
-----   Message from Thayer Headings, MD sent at 08/07/2019 11:12 AM EST ----- The Lipids look surprisingly good.  LDL is mildly elevated.   See if she can tolerate Rosuvastatin 5 mg 3 days a week.  We are getting a Coronary calcium score  Liver enz and BMP are stable

## 2019-08-07 NOTE — Progress Notes (Signed)
Subjective:    Patient ID: Nancy [name], female    DOB: 1955-06-19, 64 y.o.   MRN: [id number]  HPI  This visit occurred during the SARS-CoV-2 public health emergency.  Safety protocols were in place, including screening questions prior to the visit, additional usage of staff PPE, and extensive cleaning of exam room while observing appropriate contact time as indicated for disinfecting solutions.   Nancy [name] is a 64 year old female with a history of hypertension, hyperlipidemia, GAD who presents today for follow up of hypertension and anxiety.  She was last evaluated one week ago for symptoms of anxiety, very elevated blood pressure readings ranging from 160-200/90-100's with HR in the 90's-120's. During her last visit we stopped amlodipine and switched to lisinopril 10 mg BID. She then sent a message two days later with continued reports of elevated blood pressure readings, increased anxiety and pacing around the house. We added metoprolol tartrate 25 mg BID in an attempt to reduce HR, anxiety, BP.  Since her last visit she's feeling better. She's not feeling as anxious. She did see cardiology yesterday who added chlorthalidone 12.5 mg and potassium chloride 10 mEq BID.   She has not checked her BP over the last two days, she's also not yet started her chlorthalidone and potassium. She is becoming more aware of salt intake and admits to a very salty diet. She has since become more cognizant. She is scheduled for a calcium CT scan.   BP Readings from Last 3 Encounters:  08/07/19 130/72  08/06/19 (!) 154/82  07/31/19 (!) 162/100     Review of Systems  Respiratory: Negative for shortness of breath.   Cardiovascular: Negative for chest pain.  Neurological: Negative for dizziness and headaches.  Psychiatric/Behavioral: The patient is nervous/anxious.        Past Medical History:  Diagnosis Date  . Fatigue   . Hyperlipidemia   . Lichen sclerosus   . Routine general medical  examination at a health care facility      Social History   Socioeconomic History  . Marital status: Single    Spouse name: Not on file  . Number of children: 2  . Years of education: Not on file  . Highest education level: Not on file  Occupational History  . Not on file  Tobacco Use  . Smoking status: Never Smoker  . Smokeless tobacco: Never Used  Substance and Sexual Activity  . Alcohol use: Yes    Alcohol/week: 0.0 standard drinks    Comment: occ  . Drug use: No  . Sexual activity: Not on file  Other Topics Concern  . Not on file  Social History Narrative   Divorced.  G3P2.   Regular exercise-yes   Social Determinants of Health   Financial Resource Strain:   . Difficulty of Paying Living Expenses: Not on file  Food Insecurity:   . Worried About Charity fundraiser in the Last Year: Not on file  . Ran Out of Food in the Last Year: Not on file  Transportation Needs:   . Lack of Transportation (Medical): Not on file  . Lack of Transportation (Non-Medical): Not on file  Physical Activity:   . Days of Exercise per Week: Not on file  . Minutes of Exercise per Session: Not on file  Stress:   . Feeling of Stress : Not on file  Social Connections:   . Frequency of Communication with Friends and Family: Not on file  . Frequency of  Social Gatherings with Friends and Family: Not on file  . Attends Religious Services: Not on file  . Active Member of Clubs or Organizations: Not on file  . Attends Archivist Meetings: Not on file  . Marital Status: Not on file  Intimate Partner Violence:   . Fear of Current or Ex-Partner: Not on file  . Emotionally Abused: Not on file  . Physically Abused: Not on file  . Sexually Abused: Not on file    Past Surgical History:  Procedure Laterality Date  . ANTERIOR CERVICAL DECOMP/DISCECTOMY FUSION    . PARTIAL HYSTERECTOMY    . TUBAL LIGATION      Family History  Problem Relation Age of Onset  . Hydrocephalus Mother     . Neuropathy Mother   . Diabetes Father   . Hypertension Father   . Thyroid cancer Cousin   . Thyroid disease Maternal Aunt     Allergies  Allergen Reactions  . Statins Other (See Comments)    Myalgias     Current Outpatient Medications on File Prior to Visit  Medication Sig Dispense Refill  . chlorthalidone (HYGROTON) 25 MG tablet Take 0.5 tablets (12.5 mg total) by mouth daily. 15 tablet 11  . cholecalciferol (VITAMIN D3) 25 MCG (1000 UT) tablet Take 1,000 Units by mouth daily.    . clobetasol cream (TEMOVATE) [id number] % Apply 1 application topically as needed.    . diphenhydrAMINE HCl (BENADRYL ALLERGY PO) Take 2 capsules by mouth 2 (two) times daily as needed (For sinus congestion).     Marland Kitchen escitalopram (LEXAPRO) 10 MG tablet Take 1 tablet (10 mg total) by mouth daily. For anxiety. 90 tablet 0  . lisinopril (ZESTRIL) 10 MG tablet Take 1 tablet (10 mg total) by mouth 2 (two) times daily. For blood pressure. 60 tablet 0  . loratadine (CLARITIN) 10 MG tablet Take 10 mg by mouth daily as needed for allergies.    . metoprolol tartrate (LOPRESSOR) 25 MG tablet Take 1 tablet (25 mg total) by mouth 2 (two) times daily. With a meal for blood pressure and heart rate. 60 tablet 0  . potassium chloride (KLOR-CON) 10 MEQ tablet Take 1 tablet (10 mEq total) by mouth daily. 30 tablet 11  . rosuvastatin (CRESTOR) 5 MG tablet Take 1 tablet (5 mg total) by mouth every evening. For cholesterol. 90 tablet 3  . hydrOXYzine (VISTARIL) 25 MG capsule Take 1 capsule (25 mg total) by mouth 3 (three) times daily as needed for anxiety. (Patient not taking: Reported on 08/07/2019) 30 capsule 0   No current facility-administered medications on file prior to visit.    BP 130/72   Pulse 82   Temp (!) 96.9 F (36.1 C) (Temporal)   Ht 5\' 3"  (1.6 m)   Wt 147 lb 12 oz (67 kg)   SpO2 98%   BMI 26.17 kg/m    Objective:   Physical Exam  Constitutional: She appears well-nourished.  Cardiovascular: Normal rate and  regular rhythm.  Respiratory: Effort normal and breath sounds normal.  Musculoskeletal:     Cervical back: Neck supple.  Skin: Skin is warm and dry.  Psychiatric: She has a normal mood and affect.           Assessment & Plan:

## 2019-08-07 NOTE — Assessment & Plan Note (Signed)
Gradually improving, suspect escitalopram is starting to work. Continue same.

## 2019-08-07 NOTE — Assessment & Plan Note (Signed)
Rosuvastatin 5 mg added last week, so far she's tolerating well. Lipid panel from yesterday reviewed.   Continue statin therapy for now. She will update if she has any problems with rosuvastatin.

## 2019-08-07 NOTE — Telephone Encounter (Signed)
Results reviewed with patient who verbalized understanding. She agrees to decrease rosuvastatin to 5 mg 3 days per week. I advised that once we get the result of the coronary calcium score, we may have to increase dose and she agrees. She reports that at her PCP appointment today with Gentry Fitz, NP, her BP was normal; she was advised to measure BP over the next few days at home and to withhold starting chlorthalidone 12.5 mg daily unless BP is > 130 mmHg on a consistent basis. I advised patient to call back to let us know and also to call with additional questions or concerns. She verbalized understanding and agreement and thanked me for the call.

## 2019-08-07 NOTE — Assessment & Plan Note (Addendum)
Improved and coming down as expected, she has not yet started chlorothalidone or potassium.  Discussed to monitor BP at home over the next several days, add in new regimen at that time if she continues to notice elevated readings. We discussed the risk of hypotension given that her lisinopril and metoprolol have really only been on board for less than one week.   She will monitor. BMP reviewed from yesterday.  Follow up with cardiology as scheduled.

## 2019-08-10 ENCOUNTER — Ambulatory Visit: Payer: BLUE CROSS/BLUE SHIELD | Admitting: Primary Care

## 2019-08-14 ENCOUNTER — Ambulatory Visit: Payer: BLUE CROSS/BLUE SHIELD | Admitting: Primary Care

## 2019-08-22 ENCOUNTER — Other Ambulatory Visit: Payer: Self-pay

## 2019-08-22 ENCOUNTER — Other Ambulatory Visit: Payer: Self-pay | Admitting: Primary Care

## 2019-08-22 ENCOUNTER — Ambulatory Visit (INDEPENDENT_AMBULATORY_CARE_PROVIDER_SITE_OTHER)
Admission: RE | Admit: 2019-08-22 | Discharge: 2019-08-22 | Disposition: A | Discharge status: Home / Self Care | Payer: Self-pay | Source: Ambulatory Visit | Attending: Cardiovascular Disease | Admitting: Cardiovascular Disease

## 2019-08-22 DIAGNOSIS — I10 Essential (primary) hypertension: Secondary | ICD-10-CM

## 2019-08-22 DIAGNOSIS — E785 Hyperlipidemia, unspecified: Secondary | ICD-10-CM

## 2019-08-22 DIAGNOSIS — E663 Overweight: Secondary | ICD-10-CM

## 2019-08-23 ENCOUNTER — Ambulatory Visit: Payer: BLUE CROSS/BLUE SHIELD | Admitting: Psychology

## 2019-08-23 NOTE — Telephone Encounter (Signed)
Ok to refill? Last prescribed on 07/31/2019 . Last appointment on 07/31/2019 ( follow up). No future appointment

## 2019-08-23 NOTE — Telephone Encounter (Signed)
Noted, refill sent to pharmacy. 

## 2019-08-24 ENCOUNTER — Telehealth: Payer: Self-pay | Admitting: Cardiovascular Disease

## 2019-08-24 ENCOUNTER — Telehealth: Payer: Self-pay

## 2019-08-24 ENCOUNTER — Other Ambulatory Visit: Payer: Self-pay | Admitting: Primary Care

## 2019-08-24 DIAGNOSIS — I10 Essential (primary) hypertension: Secondary | ICD-10-CM

## 2019-08-24 NOTE — Telephone Encounter (Signed)
Patient was trying to respond to [facility] via Mychart on some test results but was unable to figure out how to do it. She is requesting a call back to discuss.

## 2019-08-24 NOTE — Telephone Encounter (Signed)
[name], Nancy Cheng, MD  08/22/2019 9:28 PM EST    Coronary calcium score of 243. This was 91st percentile for age and sex matched control. Her sons have had coronary disease at a young agle She has not tolerated statins. Please refer to lipid clinic for consideration for PCSK-9 inhibitor .   Spoke with patient who would prefer to wait to discuss this with Dr Acie Fredrickson at her appt.  Right now her insurance is out of network but that will be changing.

## 2019-08-24 NOTE — Telephone Encounter (Signed)
Pt said today at 2:45BP 83/46 and then recked 120/65. Today at 4:15 BP 97/48 and recked BP 128/64. Pt does not have any H/A,dizziness,CP or SOB. Dr Acie Fredrickson started pt on Chlorthalidone 25 mg taking 1/2 tab daily to get salt out of pts system.  Pt has been taking Lisinopril 10 mg bid, Metoprolol 25 mg bid, and Chlorthalidone 25 mg taking 1/2 tab daily.   Gentry Fitz NP said not to take any more BP meds today and starting 08/25/19 to stop taking Lisinopril. Take Metoprolol 25 mg taking 1/2 tab bid and take Chlorthalidone 25 mg taking 1/2 tab daily.  When pt wakes up take BP in the morning. If BP 100/60 or higher take BP meds as instructed; If BP is lower than 100/60 do not take BP meds.  Pt will make a BP log and will my chart that BP log to Gentry Fitz NP on 08/27/19 and update on how pt is feeling. UC & ED precautions given to pt and pt voiced understanding. FYI to Gentry Fitz NP.

## 2019-08-24 NOTE — Telephone Encounter (Signed)
This is not exactly what I said.  I will send patient a my chart message. See my chart message.

## 2019-09-04 ENCOUNTER — Encounter: Payer: Self-pay | Admitting: *Deleted

## 2019-09-06 ENCOUNTER — Encounter: Payer: Self-pay | Admitting: Cardiovascular Disease

## 2019-09-06 ENCOUNTER — Other Ambulatory Visit: Payer: Self-pay

## 2019-09-06 ENCOUNTER — Ambulatory Visit: Payer: BLUE CROSS/BLUE SHIELD | Admitting: Cardiovascular Disease

## 2019-09-06 VITALS — BP 102/74 | HR 68 | Ht 63.0 in | Wt 147.2 lb

## 2019-09-06 DIAGNOSIS — I10 Essential (primary) hypertension: Secondary | ICD-10-CM | POA: Diagnosis not present

## 2019-09-06 DIAGNOSIS — E782 Mixed hyperlipidemia: Secondary | ICD-10-CM | POA: Diagnosis not present

## 2019-09-06 DIAGNOSIS — E785 Hyperlipidemia, unspecified: Secondary | ICD-10-CM

## 2019-09-06 DIAGNOSIS — I2510 Atherosclerotic heart disease of native coronary artery without angina pectoris: Secondary | ICD-10-CM | POA: Diagnosis not present

## 2019-09-06 DIAGNOSIS — I2584 Coronary atherosclerosis due to calcified coronary lesion: Secondary | ICD-10-CM

## 2019-09-06 MED ORDER — ROSUVASTATIN CALCIUM 10 MG PO TABS: 10.0000 mg | ORAL_TABLET | Freq: Every day | ORAL | 3 refills | Status: DC

## 2019-09-06 NOTE — Progress Notes (Signed)
Cardiology Office Note:    Date:  09/06/2019   ID:  Nancy [name], DOB 06-12-1956, MRN [id number]  PCP:  Pleas Koch, NP  Cardiologist:  Kursten Kruk  Electrophysiologist:  None   Referring MD: Pleas Koch, NP   Problem List 1. Anxiety:  2.  Hyperlipidemia  3.  Hypertension   No chief complaint on file.   History of Present Illness:    Nancy [name] is a 64 y.o. female with a hx of anxiety ,   ? HTN. We were asked to see [name], NP for further evaluation of an episode of face tingling and HTN.  Mother of Nancy [name] ( has premature CAD )    Has had some tingling on the side of her face.   Became very anxious Called EMS,  BP was very elevated.  She tried amlopidine .  She had recurrent episodes of marked HTN and face tingling .  BP was 220   [name] changed the amlodipine to Lisinopril 10 mg BID  BP continued to spike  Was started on metoprolol   Admits that she does not watch her salt .  Eats fast foods frequently for lunch  Dinner  Typically fried or salty foods.    September 06, 2019:  [name] is seen for follow-up visit for hyperlipidemia and hypertension. We started her on chlorthalidone during her last visit.    Coronary calcium score of 243. This was 91st percentile for age and sex matched control.  We saw her on Crestor at that time.  Has been on a low salt diet.  Her primary MD cut her lisinopril and metoprolol      Past Medical History:  Diagnosis Date  . Chronic neck pain 04/11/2018  . Essential hypertension 07/20/2019  . Fatigue   . GAD (generalized anxiety disorder) 06/26/2018  . Hyperlipidemia   . Lichen sclerosus   . Overweight (BMI 25.0-29.9) 01/09/2018  . Routine general medical examination at a health care facility   . Urinary frequency 06/06/2019  . Vaginal itching 06/06/2019    Past Surgical History:  Procedure Laterality Date  . ANTERIOR CERVICAL DECOMP/DISCECTOMY FUSION    . PARTIAL HYSTERECTOMY    .  TUBAL LIGATION      Current Medications: Current Meds  Medication Sig  . chlorthalidone (HYGROTON) 25 MG tablet Take 0.5 tablets (12.5 mg total) by mouth daily.  . cholecalciferol (VITAMIN D3) 25 MCG (1000 UT) tablet Take 1,000 Units by mouth daily.  . citalopram (CELEXA) 20 MG tablet Take 10 mg by mouth daily.  . clobetasol cream (TEMOVATE) [id number] % Apply 1 application topically as needed.  . diphenhydrAMINE HCl (BENADRYL ALLERGY PO) Take 2 capsules by mouth 2 (two) times daily as needed (For sinus congestion).   . hydrOXYzine (VISTARIL) 25 MG capsule Take 1 capsule (25 mg total) by mouth 3 (three) times daily as needed for anxiety.  Marland Kitchen lisinopril (ZESTRIL) 10 MG tablet Take 10 mg by mouth daily.  Marland Kitchen loratadine (CLARITIN) 10 MG tablet Take 10 mg by mouth daily as needed for allergies.  . metoprolol tartrate (LOPRESSOR) 25 MG tablet Take 12.5 mg by mouth 2 (two) times daily.  . potassium chloride (KLOR-CON) 10 MEQ tablet Take 1 tablet (10 mEq total) by mouth daily.  . rosuvastatin (CRESTOR) 5 MG tablet Take 1 tablet (5 mg total) by mouth 3 (three) times a week.     Allergies:   Statins   Social History   Socioeconomic History  .  Marital status: Single    Spouse name: Not on file  . Number of children: 2  . Years of education: Not on file  . Highest education level: Not on file  Occupational History  . Not on file  Tobacco Use  . Smoking status: Never Smoker  . Smokeless tobacco: Never Used  Substance and Sexual Activity  . Alcohol use: Yes    Alcohol/week: 0.0 standard drinks    Comment: occ  . Drug use: No  . Sexual activity: Not on file  Other Topics Concern  . Not on file  Social History Narrative   Divorced.  G3P2.   Regular exercise-yes   Social Determinants of Health   Financial Resource Strain:   . Difficulty of Paying Living Expenses:   Food Insecurity:   . Worried About Charity fundraiser in the Last Year:   . Arboriculturist in the Last Year:    Transportation Needs:   . Film/video editor (Medical):   Marland Kitchen Lack of Transportation (Non-Medical):   Physical Activity:   . Days of Exercise per Week:   . Minutes of Exercise per Session:   Stress:   . Feeling of Stress :   Social Connections:   . Frequency of Communication with Friends and Family:   . Frequency of Social Gatherings with Friends and Family:   . Attends Religious Services:   . Active Member of Clubs or Organizations:   . Attends Archivist Meetings:   Marland Kitchen Marital Status:      Family History: The patient's family history includes Diabetes in her father; Hydrocephalus in her mother; Hypertension in her father; Neuropathy in her mother; Thyroid cancer in her cousin; Thyroid disease in her maternal aunt.  ROS:   Please see the history of present illness.     All other systems reviewed and are negative.  EKGs/Labs/Other Studies Reviewed:    The following studies were reviewed today:   EKG:   August 06, 2019: Normal sinus rhythm at 75.  Nonspecific ST and T wave abnormalities.  Recent Labs: 07/19/2019: Hemoglobin 13.3; Platelets 255 07/31/2019: TSH 0.62 08/06/2019: ALT 13; BUN 10; Creatinine, Ser 0.75; Potassium 4.3; Sodium 142  Recent Lipid Panel    Component Value Date/Time   CHOL 195 08/06/2019 1607   TRIG 115 08/06/2019 1607   HDL 71 08/06/2019 1607   CHOLHDL 2.7 08/06/2019 1607   CHOLHDL 4 04/11/2018 0841   VLDL 18.0 04/11/2018 0841   LDLCALC 104 (H) 08/06/2019 1607   LDLDIRECT 197.5 11/02/2011 0819    Physical Exam:    Physical Exam: Blood pressure 102/74, pulse 68, height 5\' 3"  (1.6 m), weight 147 lb 3.2 oz (66.8 kg), SpO2 98 %.  GEN:  Well nourished, well developed in no acute distress HEENT: Normal NECK: No JVD; No carotid bruits LYMPHATICS: No lymphadenopathy CARDIAC: RRR , no murmurs, rubs, gallops RESPIRATORY:  Clear to auscultation without rales, wheezing or rhonchi  ABDOMEN: Soft, non-tender, non-distended MUSCULOSKELETAL:   No edema; No deformity  SKIN: Warm and dry NEUROLOGIC:  Alert and oriented x 3   ASSESSMENT:    No diagnosis found. PLAN:    In order of problems listed above:  1.   Hyperlipidemia:    Coronary calcium score of 243. This was 91st percentile for age and sex matched control.  She is tolerating the Crestor 5 mg fairly well.  She has not tolerated other statins but seems to be tolerating rosuvastatin.  We will increase the  rosuvastatin to 10 mg and check labs again in 3 months.  I have a low threshold to send her to the lipid clinic if were not able to achieve an LDL of less than 70.   2.  Hypertension:   She has  greatly improved her salt intake.  Her blood pressure has dropped and is quite low today.  She is recorded some systolics of [id number] and 0000000 over the past couple of months.  At this time I would like for her to stop the chlorthalidone and the potassium.  She will keep these on hand in case she needs them later.  She will send [place] some blood pressure readings in approximately 2 to 3 weeks.  She will continue current dose of metoprolol and lisinopril.  3.  Anxiety: She will continue to follow-up with Allie Bossier, NP.   Medication Adjustments/Labs and Tests Ordered: Current medicines are reviewed at length with the patient today.  Concerns regarding medicines are outlined above.  No orders of the defined types were placed in this encounter.  No orders of the defined types were placed in this encounter.    There are no Patient Instructions on file for this visit.   Signed, Mertie Moores, MD  09/06/2019 9:27 AM    [place]

## 2019-09-06 NOTE — Patient Instructions (Addendum)
Medication Instructions:  Your physician has recommended you make the following change in your medication:  STOP Chlorthalidone STOP Kdur (potassium chloride) INCREASE Rosuvastatin (Crestor) to 10 mg once daily  *If you need a refill on your cardiac medications before your next appointment, please call your pharmacy*   Lab Work: Your physician recommends that you return for lab work on May 27 to recheck cholesterol and liver function You will need to FAST for this appointment - nothing to eat or drink after midnight the night before except water.   If you have labs (blood work) drawn today and your tests are completely normal, you will receive your results only by: Marland Kitchen MyChart Message (if you have MyChart) OR . A paper copy in the mail If you have any lab test that is abnormal or we need to change your treatment, we will call you to review the results.   Testing/Procedures: None Ordered   Follow-Up: At [facility], you and your health needs are our priority.  As part of our continuing mission to provide you with exceptional heart care, we have created designated Provider Care Teams.  These Care Teams include your primary Cardiologist (physician) and Advanced Practice Providers (APPs -  Physician Assistants and Nurse Practitioners) who all work together to provide you with the care you need, when you need it.   Your next appointment:   3 month(s) on Friday June 25  The format for your next appointment:   In Person  Provider:   Mertie Moores, MD

## 2019-09-17 ENCOUNTER — Other Ambulatory Visit: Payer: Self-pay | Admitting: Primary Care

## 2019-09-17 DIAGNOSIS — I10 Essential (primary) hypertension: Secondary | ICD-10-CM

## 2019-09-18 NOTE — Telephone Encounter (Signed)
Have not prescribed. Last appointment on 08/07/2019. No future appointment

## 2019-09-18 NOTE — Telephone Encounter (Signed)
I initiated this Rx. Refill provided.

## 2019-10-09 DIAGNOSIS — E782 Mixed hyperlipidemia: Secondary | ICD-10-CM

## 2019-10-11 ENCOUNTER — Telehealth: Payer: Self-pay | Admitting: Cardiovascular Disease

## 2019-10-11 NOTE — Telephone Encounter (Signed)
Patient states she is requesting to speak with a nurse to ensure that she is able to take her medication prior to coming to the office for lipid lab work scheduled for 11/19/19. Please advise.

## 2019-10-15 ENCOUNTER — Other Ambulatory Visit: Payer: Self-pay | Admitting: Primary Care

## 2019-10-15 DIAGNOSIS — I10 Essential (primary) hypertension: Secondary | ICD-10-CM

## 2019-10-16 NOTE — Telephone Encounter (Signed)
Returned call to patient who called to ask if she should take her medication prior to coming in for fasting lab work. I advised that she may either take medication on empty stomach or wait and take medication after she completes lab appointment. She verbalized understanding and thanked me for the call.

## 2019-10-21 ENCOUNTER — Other Ambulatory Visit: Payer: Self-pay | Admitting: Primary Care

## 2019-10-21 DIAGNOSIS — F411 Generalized anxiety disorder: Secondary | ICD-10-CM

## 2019-11-08 ENCOUNTER — Other Ambulatory Visit: Payer: BLUE CROSS/BLUE SHIELD

## 2019-11-19 ENCOUNTER — Other Ambulatory Visit: Payer: PPO

## 2019-11-19 ENCOUNTER — Other Ambulatory Visit: Payer: Self-pay

## 2019-11-19 DIAGNOSIS — I2510 Atherosclerotic heart disease of native coronary artery without angina pectoris: Secondary | ICD-10-CM

## 2019-11-19 DIAGNOSIS — I2584 Coronary atherosclerosis due to calcified coronary lesion: Secondary | ICD-10-CM

## 2019-11-19 DIAGNOSIS — E785 Hyperlipidemia, unspecified: Secondary | ICD-10-CM | POA: Diagnosis not present

## 2019-11-19 LAB — HEPATIC FUNCTION PANEL
ALT: 13 IU/L (ref 0–32)
AST: 16 IU/L (ref 0–40)
Albumin: 4.2 g/dL (ref 3.8–4.8)
Alkaline Phosphatase: 82 IU/L (ref 48–121)
Bilirubin Total: 0.3 mg/dL (ref 0.0–1.2)
Bilirubin, Direct: 0.09 mg/dL (ref 0.00–0.40)
Total Protein: 6.2 g/dL (ref 6.0–8.5)

## 2019-11-19 LAB — LIPID PANEL
Chol/HDL Ratio: 4 ratio (ref 0.0–4.4)
Cholesterol, Total: 251 mg/dL — ABNORMAL HIGH (ref 100–199)
HDL: 62 mg/dL (ref >39)
LDL Chol Calc (NIH): 169 mg/dL — ABNORMAL HIGH (ref 0–99)
Triglycerides: 112 mg/dL (ref 0–149)
VLDL Cholesterol Cal: 20 mg/dL (ref 5–40)

## 2019-11-20 NOTE — Progress Notes (Signed)
Patient ID: Bannie Lobban                 DOB: 1956-02-08                    MRN: [id number]     HPI: Mirage Pfefferkorn is a 64 y.o. female patient referred to lipid clinic by Dr. Acie Fredrickson. PMH is significant for anxiety, HTN, and HLD.   She was last seen by Dr. Acie Fredrickson on 09/06/19. She was doing well at that time and was tolerating rosuvastatin 5mg . Dr. Acie Fredrickson increased the dose to 10mg  daily given that her LDL remained elevated at 104 (08/06/19). Of note, she had just started rosuvastatin on 08/02/19 so this LDL was not reflective of effects from rosuvastatin.    In April 2021, she contacted the office and let us know that she started having some muscle pains like she had before with atorvastatin. She was instructed to stop rousvastatin until she felt better then resume rosuvastatin at 5mg  daily or 5mg  three times per week.   Today she presents in good spirits. She mentioned that she had been tolerating rosuvastatin fine until around April 2021. At that time, she started taking 5mg  three times per week as instructed without improvement in her severe myalgias. Ultimately, she stopped taking rosuvastatin on 11/01/19 and has noted improvement in myalgias. To this point, she had myalgias with the following lipid-lowering therapies: rosuvastatin 5mg  three times per week, atorvastatin 20 mg daily, simvastatin, and ezetimibe. She does not wish to try another statin at this time. She did note that her brother also had intolerance to statins and ezetimibe.  She had her cholesterol checked 2 days ago (11/19/19) and her LDL was 169. She does endorse that she "ate bad" the weekend before her cholesterol was checked which she thinks increased it.   Current Lipid Medications: none Intolerances: rosuvastatin, atorvastatin, simvastatin, and ezetimibe (myalgias) Risk Factors: coronary calcifications (CAC 243) LDL goal: <70 per Dr. Acie Fredrickson  Diet: She has been eating more fried and fatty foods lately  Family History:  The patient's family history includes diabetes in her father; Hydrocephalus in her mother; Hypertension in her father; Neuropathy in her mother; Thyroid cancer in her cousin; Thyroid disease in her maternal aunt and premature CAD in her sons.  Social History: Never smoked  Labs (11/19/19): TC 251, HDL 62, LDL 169, TG 112  Rx Insurance:  - ID: [id number] Kara Dies: [id number] - PCN: [id number] - GRP: [id number]  Past Medical History:  Diagnosis Date   Chronic neck pain 04/11/2018   Essential hypertension 07/20/2019   Fatigue    GAD (generalized anxiety disorder) 06/26/2018   Hyperlipidemia    Lichen sclerosus    Overweight (BMI 25.0-29.9) 01/09/2018   Routine general medical examination at a health care facility    Urinary frequency 06/06/2019   Vaginal itching 06/06/2019    Current Outpatient Medications on File Prior to Visit  Medication Sig Dispense Refill   cholecalciferol (VITAMIN D3) 25 MCG (1000 UT) tablet Take 1,000 Units by mouth daily.     citalopram (CELEXA) 20 MG tablet Take 10 mg by mouth daily.     clobetasol cream (TEMOVATE) 1.50 % Apply 1 application topically as needed.     diphenhydrAMINE HCl (BENADRYL ALLERGY PO) Take 2 capsules by mouth 2 (two) times daily as needed (For sinus congestion).      hydrOXYzine (VISTARIL) 25 MG capsule Take 1 capsule (25 mg total) by mouth 3 (three) times daily  as needed for anxiety. 30 capsule 0   lisinopril (ZESTRIL) 10 MG tablet Take 10 mg by mouth daily.     loratadine (CLARITIN) 10 MG tablet Take 10 mg by mouth daily as needed for allergies.     metoprolol tartrate (LOPRESSOR) 25 MG tablet TAKE 1 TABLET BY MOUTH TWICE A DAY WITH A MEAL FOR BLOOD PRESSURE/HEART RATE 180 tablet 1   rosuvastatin (CRESTOR) 10 MG tablet Take 1 tablet (10 mg total) by mouth daily. 90 tablet 3   No current facility-administered medications on file prior to visit.    Allergies  Allergen Reactions   Statins Other (See Comments)    Myalgias      Assessment/Plan:  1. Hyperlipidemia -  - LDL uncontrolled at 169 on 11/19/19 - Discussed reducing intake of fatty and fried foods to improve cholesterol - Discontinue rosuvastatin - Initiate Repatha 140 mg SQ every 14 days (pt will take every other Sunday starting 6/13). Prior authorization approved thru 01/20/20 by insurance company for $180 copay for 3 month supply. Discussed copay with patient and she said she can afford it at this time. Can consider [employer] grant if she is not able to afford it in the future.  - F/u in clinic to re-check lipid panel in 2-3 months after starting Repatha - Will need to resubmit prior authorization for continuation of therapy in August 2021   Kennon Holter, PharmD PGY1 Ambulatory Care Pharmacy Resident

## 2019-11-21 ENCOUNTER — Ambulatory Visit (INDEPENDENT_AMBULATORY_CARE_PROVIDER_SITE_OTHER): Payer: PPO | Admitting: Pharmacist

## 2019-11-21 ENCOUNTER — Other Ambulatory Visit: Payer: Self-pay

## 2019-11-21 DIAGNOSIS — E782 Mixed hyperlipidemia: Secondary | ICD-10-CM

## 2019-11-21 DIAGNOSIS — I2584 Coronary atherosclerosis due to calcified coronary lesion: Secondary | ICD-10-CM | POA: Diagnosis not present

## 2019-11-21 DIAGNOSIS — I2510 Atherosclerotic heart disease of native coronary artery without angina pectoris: Secondary | ICD-10-CM

## 2019-11-21 MED ORDER — REPATHA SURECLICK 140 MG/ML ~~LOC~~ SOAJ: 1.0000 "pen " | SUBCUTANEOUS | 3 refills | Status: DC

## 2019-11-21 NOTE — Patient Instructions (Addendum)
It was a pleasure meeting you today.  Stop taking rosuvastatin.  We will start you on a medication called Repatha. We will get the approval from your insurance company first and then send the prescription to [facility].  Please call us if you have any questions. [phone]

## 2019-12-07 ENCOUNTER — Ambulatory Visit: Payer: PPO | Admitting: Cardiovascular Disease

## 2019-12-07 ENCOUNTER — Encounter: Payer: Self-pay | Admitting: Cardiovascular Disease

## 2019-12-07 ENCOUNTER — Other Ambulatory Visit: Payer: Self-pay

## 2019-12-07 VITALS — BP 116/74 | HR 60 | Ht 63.0 in | Wt 148.2 lb

## 2019-12-07 DIAGNOSIS — E782 Mixed hyperlipidemia: Secondary | ICD-10-CM

## 2019-12-07 NOTE — Patient Instructions (Addendum)
Medication Instructions:  STOP METOPROLOL  *If you need a refill on your cardiac medications before your next appointment, please call your pharmacy*   Lab Work: None today  Testing/Procedures: none   Follow-Up: At [facility], you and your health needs are our priority.  As part of our continuing mission to provide you with exceptional heart care, we have created designated Provider Care Teams.  These Care Teams include your primary Cardiologist (physician) and Advanced Practice Providers (APPs -  Physician Assistants and Nurse Practitioners) who all work together to provide you with the care you need, when you need it.  Your next appointment:   12 month(s)  The format for your next appointment:   In Person  Provider:   You may see Mertie Moores, MD or one of the following Advanced Practice Providers on your designated Care Team:    Richardson Dopp, PA-C  Robbie Lis, [place]    Other Instructions

## 2019-12-07 NOTE — Progress Notes (Signed)
Cardiology Office Note:    Date:  12/07/2019   ID:  Nancy [name], DOB March 03, 1956, MRN [id number]  PCP:  Pleas Koch, NP  Cardiologist:  Tamarick Kovalcik  Electrophysiologist:  None   Referring MD: Pleas Koch, NP   Problem List 1. Anxiety:  2.  Hyperlipidemia  3.  Hypertension   Chief Complaint  Patient presents with   Hypertension   Hyperlipidemia    History of Present Illness:    Nancy [name] is a 64 y.o. female with a hx of anxiety ,   ? HTN. We were asked to see her by Nancy Friendly, NP for further evaluation of an episode of face tingling and HTN.  Mother of Nancy [name] ( has premature CAD )    Has had some tingling on the side of her face.   Became very anxious Called EMS,  BP was very elevated.  She tried amlopidine .  She had recurrent episodes of marked HTN and face tingling .  BP was 220   Nancy [name] changed the amlodipine to Lisinopril 10 mg BID  BP continued to spike  Was started on metoprolol   Admits that she does not watch her salt .  Eats fast foods frequently for lunch  Dinner  Typically fried or salty foods.    September 06, 2019:  Nancy [name] is seen for follow-up visit for hyperlipidemia and hypertension. We started her on chlorthalidone during her last visit.    Coronary calcium score of 243. This was 91st percentile for age and sex matched control.  We saw her on Crestor at that time.  Has been on a low salt diet.  Her primary MD cut her lisinopril and metoprolol   December 07, 2019:  Nancy [name] is seen today for follow-up of her hyperlipidemia and hypertension. Feeling well,  Doing great.  She is now on Repatha  BP and HR are very well controlled on metoprolol 12,5 BID Her anxiety is much better controlled   Past Medical History:  Diagnosis Date   Chronic neck pain 04/11/2018   Essential hypertension 07/20/2019   Fatigue    GAD (generalized anxiety disorder) 06/26/2018   Hyperlipidemia    Lichen sclerosus    Overweight (BMI  25.0-29.9) 01/09/2018   Routine general medical examination at a health care facility    Urinary frequency 06/06/2019   Vaginal itching 06/06/2019    Past Surgical History:  Procedure Laterality Date   ANTERIOR CERVICAL DECOMP/DISCECTOMY FUSION     PARTIAL HYSTERECTOMY     TUBAL LIGATION      Current Medications: Current Meds  Medication Sig   cholecalciferol (VITAMIN D3) 25 MCG (1000 UT) tablet Take 1,000 Units by mouth daily.   citalopram (CELEXA) 20 MG tablet Take 10 mg by mouth daily.   clobetasol cream (TEMOVATE) 7.51 % Apply 1 application topically as needed.   Evolocumab (REPATHA SURECLICK) 700 MG/ML SOAJ Inject 1 pen into the skin every 14 (fourteen) days.   loratadine (CLARITIN) 10 MG tablet Take 10 mg by mouth daily as needed for allergies.   [DISCONTINUED] lisinopril (ZESTRIL) 10 MG tablet Take 10 mg by mouth 2 (two) times daily.   [DISCONTINUED] metoprolol tartrate (LOPRESSOR) 25 MG tablet Take 12.5 mg by mouth 2 (two) times daily.     Allergies:   Statins and Zetia [ezetimibe]   Social History   Socioeconomic History   Marital status: Single    Spouse name: Not on file   Number of children: 2  Years of education: Not on file   Highest education level: Not on file  Occupational History   Not on file  Tobacco Use   Smoking status: Never Smoker   Smokeless tobacco: Never Used  Vaping Use   Vaping Use: Never used  Substance and Sexual Activity   Alcohol use: Yes    Alcohol/week: 0.0 standard drinks    Comment: occ   Drug use: No   Sexual activity: Not on file  Other Topics Concern   Not on file  Social History Narrative   Divorced.  G3P2.   Regular exercise-yes   Social Determinants of Health   Financial Resource Strain:    Difficulty of Paying Living Expenses:   Food Insecurity:    Worried About Charity fundraiser in the Last Year:    Arboriculturist in the Last Year:   Transportation Needs:    Lexicographer (Medical):    Lack of Transportation (Non-Medical):   Physical Activity:    Days of Exercise per Week:    Minutes of Exercise per Session:   Stress:    Feeling of Stress :   Social Connections:    Frequency of Communication with Friends and Family:    Frequency of Social Gatherings with Friends and Family:    Attends Religious Services:    Active Member of Clubs or Organizations:    Attends Archivist Meetings:    Marital Status:      Family History: The patient's family history includes Diabetes in her father; Hydrocephalus in her mother; Hypertension in her father; Neuropathy in her mother; Thyroid cancer in her cousin; Thyroid disease in her maternal aunt.  ROS:   Please see the history of present illness.     All other systems reviewed and are negative.  EKGs/Labs/Other Studies Reviewed:    The following studies were reviewed today:   ECG:   Recent Labs: 07/19/2019: Hemoglobin 13.3; Platelets 255 07/31/2019: TSH 0.62 08/06/2019: BUN 10; Creatinine, Ser 0.75; Potassium 4.3; Sodium 142 11/19/2019: ALT 13  Recent Lipid Panel    Component Value Date/Time   CHOL 251 (H) 11/19/2019 0905   TRIG 112 11/19/2019 0905   HDL 62 11/19/2019 0905   CHOLHDL 4.0 11/19/2019 0905   CHOLHDL 4 04/11/2018 0841   VLDL 18.0 04/11/2018 0841   LDLCALC 169 (H) 11/19/2019 0905   LDLDIRECT 197.5 11/02/2011 0819    Physical Exam:    Physical Exam: Blood pressure 116/74, pulse 60, height 5\' 3"  (1.6 m), weight 148 lb 4 oz (67.2 kg), SpO2 97 %.  GEN:  Well nourished, well developed in no acute distress HEENT: Normal NECK: No JVD; No carotid bruits LYMPHATICS: No lymphadenopathy CARDIAC: RRR , no murmurs, rubs, gallops RESPIRATORY:  Clear to auscultation without rales, wheezing or rhonchi  ABDOMEN: Soft, non-tender, non-distended MUSCULOSKELETAL:  No edema; No deformity  SKIN: Warm and dry NEUROLOGIC:  Alert and oriented x 3    ASSESSMENT:    1.  Mixed hyperlipidemia    PLAN:    In order of problems listed above:  1.   Hyperlipidemia:    Coronary calcium score of 243. This was 91st percentile for age and sex matched control.    She is now on Repatha.  Continue current medications.  We will be rechecking labs in August.   2.  Hypertension:    Blood pressures been well controlled.  We have stopped the ARB.  She is on low-dose metoprolol which I think  we can also stop.  She thinks that a lot of her hypertension was due to some anxiety that she was having last year.  She will continue to watch her blood pressure.  3.  Anxiety:     Medication Adjustments/Labs and Tests Ordered: Current medicines are reviewed at length with the patient today.  Concerns regarding medicines are outlined above.  No orders of the defined types were placed in this encounter.  No orders of the defined types were placed in this encounter.    Patient Instructions  Medication Instructions:  STOP METOPROLOL  *If you need a refill on your cardiac medications before your next appointment, please call your pharmacy*   Lab Work: None today  Testing/Procedures: none   Follow-Up: At [facility], you and your health needs are our priority.  As part of our continuing mission to provide you with exceptional heart care, we have created designated Provider Care Teams.  These Care Teams include your primary Cardiologist (physician) and Advanced Practice Providers (APPs -  Physician Assistants and Nurse Practitioners) who all work together to provide you with the care you need, when you need it.  Your next appointment:   12 month(s)  The format for your next appointment:   In Person  Provider:   You may see Mertie Moores, MD or one of the following Advanced Practice Providers on your designated Care Team:    Richardson Dopp, PA-C  Robbie Lis, [place]    Other Instructions      Signed, Mertie Moores, MD  12/07/2019 10:55 AM    [place]

## 2020-01-23 ENCOUNTER — Other Ambulatory Visit: Payer: PPO | Admitting: *Deleted

## 2020-01-23 ENCOUNTER — Other Ambulatory Visit: Payer: Self-pay

## 2020-01-23 DIAGNOSIS — I2510 Atherosclerotic heart disease of native coronary artery without angina pectoris: Secondary | ICD-10-CM

## 2020-01-23 DIAGNOSIS — E782 Mixed hyperlipidemia: Secondary | ICD-10-CM

## 2020-01-23 DIAGNOSIS — I2584 Coronary atherosclerosis due to calcified coronary lesion: Secondary | ICD-10-CM | POA: Diagnosis not present

## 2020-01-23 LAB — LIPID PANEL
Chol/HDL Ratio: 2.7 ratio (ref 0.0–4.4)
Cholesterol, Total: 217 mg/dL — ABNORMAL HIGH (ref 100–199)
HDL: 79 mg/dL (ref >39)
LDL Chol Calc (NIH): 120 mg/dL — ABNORMAL HIGH (ref 0–99)
Triglycerides: 102 mg/dL (ref 0–149)
VLDL Cholesterol Cal: 18 mg/dL (ref 5–40)

## 2020-01-24 ENCOUNTER — Telehealth: Payer: Self-pay | Admitting: Pharmacist

## 2020-01-24 DIAGNOSIS — E782 Mixed hyperlipidemia: Secondary | ICD-10-CM

## 2020-01-24 NOTE — Telephone Encounter (Signed)
Called patient to review her lipid labs with her. LDL did not drop as much as expected. LDL is 120 (only down from 169). Will need to confirm compliance. If patient has been taking every 2 weeks without missed doses then option include switching Repatha to Praluent or adding Nexletol to [place]. Could apply for [employer]well grant if copays are unaffordable. Left message for patient to call back.

## 2020-01-24 NOTE — Telephone Encounter (Signed)
Patient called back- has given 4 injections. Last injection was July 30. Discussed options switching Repatha to Praluent or adding Nexletol to [place]. Patient would like to work on diet and exercise for the next month and then repeat labs in 4 weeks. If LDL is still above goal, will revisit options above.

## 2020-01-24 NOTE — Addendum Note (Signed)
Addended by: Marcelle Overlie D on: 01/24/2020 01:32 PM   Modules accepted: Orders

## 2020-02-21 ENCOUNTER — Other Ambulatory Visit: Payer: PPO | Admitting: *Deleted

## 2020-02-21 ENCOUNTER — Other Ambulatory Visit: Payer: Self-pay

## 2020-02-21 DIAGNOSIS — E782 Mixed hyperlipidemia: Secondary | ICD-10-CM

## 2020-02-21 LAB — LIPID PANEL
Chol/HDL Ratio: 2.6 ratio (ref 0.0–4.4)
Cholesterol, Total: 221 mg/dL — ABNORMAL HIGH (ref 100–199)
HDL: 85 mg/dL (ref >39)
LDL Chol Calc (NIH): 121 mg/dL — ABNORMAL HIGH (ref 0–99)
Triglycerides: 86 mg/dL (ref 0–149)
VLDL Cholesterol Cal: 15 mg/dL (ref 5–40)

## 2020-02-22 ENCOUNTER — Telehealth: Payer: Self-pay | Admitting: Pharmacist

## 2020-02-22 MED ORDER — ROSUVASTATIN CALCIUM 5 MG PO TABS
ORAL_TABLET | ORAL | 3 refills | Status: DC
Start: 2020-02-22 — End: 2020-04-24

## 2020-02-22 NOTE — Telephone Encounter (Signed)
Called pt to review lipid labs. LDL has only minimally improved on Repatha even after longer therapy. Patient states she has been compliant with Repatha. We discussed switching to Praluent or adding another medication. She has been having backaches, sore throat and mild headaches. She is unsure if its from [place]. Is frustrated because her cholesterol was better before when she had only been on a statin for 4 days. She would like to retry statins. Discussed rosuvastatin 5mg  two times a week vs pravastatin 20mg  daily. She would like to start rosuvastatin 5mg  two times a week. She does not want to continue Repatha. Follow up labs in 2 months.

## 2020-03-03 DIAGNOSIS — H25813 Combined forms of age-related cataract, bilateral: Secondary | ICD-10-CM | POA: Diagnosis not present

## 2020-03-03 DIAGNOSIS — H5213 Myopia, bilateral: Secondary | ICD-10-CM | POA: Diagnosis not present

## 2020-03-03 DIAGNOSIS — H524 Presbyopia: Secondary | ICD-10-CM | POA: Diagnosis not present

## 2020-04-21 ENCOUNTER — Telehealth: Payer: Self-pay

## 2020-04-21 DIAGNOSIS — E782 Mixed hyperlipidemia: Secondary | ICD-10-CM

## 2020-04-21 NOTE — Telephone Encounter (Signed)
-----   Message from Ramond Dial, [place] sent at 04/21/2020  7:29 AM EST -----  ----- Message ----- From: Ramond Dial, RPH-CPP Sent: 04/18/2020 To: Ramond Dial, RPH-CPP  Set up lipids

## 2020-04-21 NOTE — Telephone Encounter (Signed)
Called and set up labs for lipid

## 2020-04-22 ENCOUNTER — Other Ambulatory Visit: Payer: Self-pay

## 2020-04-22 ENCOUNTER — Other Ambulatory Visit: Payer: PPO

## 2020-04-22 ENCOUNTER — Telehealth: Payer: Self-pay | Admitting: Primary Care

## 2020-04-22 DIAGNOSIS — E782 Mixed hyperlipidemia: Secondary | ICD-10-CM | POA: Diagnosis not present

## 2020-04-22 LAB — LIPID PANEL
Chol/HDL Ratio: 4.4 ratio (ref 0.0–4.4)
Cholesterol, Total: 280 mg/dL — ABNORMAL HIGH (ref 100–199)
HDL: 64 mg/dL (ref >39)
LDL Chol Calc (NIH): 191 mg/dL — ABNORMAL HIGH (ref 0–99)
Triglycerides: 139 mg/dL (ref 0–149)
VLDL Cholesterol Cal: 25 mg/dL (ref 5–40)

## 2020-04-22 NOTE — Telephone Encounter (Signed)
Pt called in wanted to know she needs to do a another lab or can Anda Kraft use the labs that she had done today 04/22/20 by Dr. Acie Fredrickson

## 2020-04-22 NOTE — Telephone Encounter (Signed)
I do not require any extra labs at this time, but I would like to see her in February 2022 for an annual follow-up. Please schedule.

## 2020-04-22 NOTE — Telephone Encounter (Signed)
Please advise 

## 2020-04-24 MED ORDER — PRALUENT 75 MG/ML ~~LOC~~ SOAJ: 1.0000 "pen " | SUBCUTANEOUS | 3 refills | Status: DC

## 2020-04-24 NOTE — Telephone Encounter (Signed)
I spoke with patient, answered all her questions. She is willing to try praluent 75mg  q 14 days. Will send in rx. Repeat lipids in 3 months

## 2020-04-24 NOTE — Addendum Note (Signed)
Addended by: Marcelle Overlie D on: 04/24/2020 02:32 PM   Modules accepted: Orders

## 2020-04-28 NOTE — Telephone Encounter (Signed)
Called patient and scheduled for cpe.

## 2020-05-26 ENCOUNTER — Other Ambulatory Visit: Payer: Self-pay

## 2020-05-26 ENCOUNTER — Encounter: Payer: Self-pay | Admitting: Primary Care

## 2020-05-26 ENCOUNTER — Ambulatory Visit (INDEPENDENT_AMBULATORY_CARE_PROVIDER_SITE_OTHER): Payer: PPO | Admitting: Primary Care

## 2020-05-26 VITALS — BP 124/68 | HR 73 | Temp 98.2°F | Ht 63.0 in | Wt 149.0 lb

## 2020-05-26 DIAGNOSIS — Z23 Encounter for immunization: Secondary | ICD-10-CM

## 2020-05-26 DIAGNOSIS — L509 Urticaria, unspecified: Secondary | ICD-10-CM | POA: Diagnosis not present

## 2020-05-26 DIAGNOSIS — F411 Generalized anxiety disorder: Secondary | ICD-10-CM | POA: Diagnosis not present

## 2020-05-26 DIAGNOSIS — I10 Essential (primary) hypertension: Secondary | ICD-10-CM | POA: Diagnosis not present

## 2020-05-26 DIAGNOSIS — E782 Mixed hyperlipidemia: Secondary | ICD-10-CM

## 2020-05-26 LAB — COMPREHENSIVE METABOLIC PANEL
ALT: 11 U/L (ref 0–35)
AST: 15 U/L (ref 0–37)
Albumin: 4.3 g/dL (ref 3.5–5.2)
Alkaline Phosphatase: 82 U/L (ref 39–117)
BUN: 16 mg/dL (ref 6–23)
CO2: 31 mEq/L (ref 19–32)
Calcium: 9.2 mg/dL (ref 8.4–10.5)
Chloride: 102 mEq/L (ref 96–112)
Creatinine, Ser: 0.7 mg/dL (ref 0.40–1.20)
GFR: 90.72 mL/min (ref >60.00)
Glucose, Bld: 93 mg/dL (ref 70–99)
Potassium: 4.9 mEq/L (ref 3.5–5.1)
Sodium: 139 mEq/L (ref 135–145)
Total Bilirubin: 0.6 mg/dL (ref 0.2–1.2)
Total Protein: 6.9 g/dL (ref 6.0–8.3)

## 2020-05-26 LAB — CBC WITH DIFFERENTIAL/PLATELET
Basophils Absolute: 0.1 10*3/uL (ref 0.0–0.1)
Basophils Relative: 1 % (ref 0.0–3.0)
Eosinophils Absolute: 0.4 10*3/uL (ref 0.0–0.7)
Eosinophils Relative: 6.7 % — ABNORMAL HIGH (ref 0.0–5.0)
HCT: 41.6 % (ref 36.0–46.0)
Hemoglobin: 13.7 g/dL (ref 12.0–15.0)
Lymphocytes Relative: 27.7 % (ref 12.0–46.0)
Lymphs Abs: 1.7 10*3/uL (ref 0.7–4.0)
MCHC: 33.1 g/dL (ref 30.0–36.0)
MCV: 86.9 fl (ref 78.0–100.0)
Monocytes Absolute: 0.4 10*3/uL (ref 0.1–1.0)
Monocytes Relative: 6.2 % (ref 3.0–12.0)
Neutro Abs: 3.5 10*3/uL (ref 1.4–7.7)
Neutrophils Relative %: 58.4 % (ref 43.0–77.0)
Platelets: 232 10*3/uL (ref 150.0–400.0)
RBC: 4.78 Mil/uL (ref 3.87–5.11)
RDW: 13.5 % (ref 11.5–15.5)
WBC: 6 10*3/uL (ref 4.0–10.5)

## 2020-05-26 MED ORDER — EPINEPHRINE 0.3 MG/0.3ML IJ SOAJ: 0.3000 mg | INTRAMUSCULAR | 0 refills | Status: DC | PRN

## 2020-05-26 NOTE — Patient Instructions (Addendum)
Stop by the lab prior to leaving today. I will notify you of your results once received.   You will be contacted regarding your referral to the allergist.  Please let us know if you have not been contacted within two weeks.   Pay close attention to any symptoms that may develop within the next several days after injecting the Praulent medication.  Consider Zyrtec daily to help prevent hives for now.   It was a pleasure to see you today!       Influenza (Flu) Vaccine (Inactivated or Recombinant): What You Need to Know 1. Why get vaccinated? Influenza vaccine can prevent influenza (flu). Flu is a contagious disease that spreads around the [place] every year, usually between October and May. Anyone can get the flu, but it is more dangerous for some people. Infants and young children, people 65 years of age and older, pregnant women, and people with certain health conditions or a weakened immune system are at greatest risk of flu complications. Pneumonia, bronchitis, sinus infections and ear infections are examples of flu-related complications. If you have a medical condition, such as heart disease, cancer or diabetes, flu can make it worse. Flu can cause fever and chills, sore throat, muscle aches, fatigue, cough, headache, and runny or stuffy nose. Some people may have vomiting and diarrhea, though this is more common in children than adults. Each year thousands of people in the [place] die from flu, and many more are hospitalized. Flu vaccine prevents millions of illnesses and flu-related visits to the doctor each year. 2. Influenza vaccine CDC recommends everyone 60 months of age and older get vaccinated every flu season. Children 6 months through 62 years of age may need 2 doses during a single flu season. Everyone else needs only 1 dose each flu season. It takes about 2 weeks for protection to develop after vaccination. There are many flu viruses, and they are always changing.  Each year a new flu vaccine is made to protect against three or four viruses that are likely to cause disease in the upcoming flu season. Even when the vaccine doesn't exactly match these viruses, it may still provide some protection. Influenza vaccine does not cause flu. Influenza vaccine may be given at the same time as other vaccines. 3. Talk with your health care provider Tell your vaccine provider if the person getting the vaccine:  Has had an allergic reaction after a previous dose of influenza vaccine, or has any severe, life-threatening allergies.  Has ever had Guillain-Barr Syndrome (also called GBS). In some cases, your health care provider may decide to postpone influenza vaccination to a future visit. People with minor illnesses, such as a cold, may be vaccinated. People who are moderately or severely ill should usually wait until they recover before getting influenza vaccine. Your health care provider can give you more information. 4. Risks of a vaccine reaction  Soreness, redness, and swelling where shot is given, fever, muscle aches, and headache can happen after influenza vaccine.  There may be a very small increased risk of Guillain-Barr Syndrome (GBS) after inactivated influenza vaccine (the flu shot). Young children who get the flu shot along with pneumococcal vaccine (PCV13), and/or DTaP vaccine at the same time might be slightly more likely to have a seizure caused by fever. Tell your health care provider if a child who is getting flu vaccine has ever had a seizure. People sometimes faint after medical procedures, including vaccination. Tell your provider if you feel dizzy  or have vision changes or ringing in the ears. As with any medicine, there is a very remote chance of a vaccine causing a severe allergic reaction, other serious injury, or death. 5. What if there is a serious problem? An allergic reaction could occur after the vaccinated person leaves the clinic. If you  see signs of a severe allergic reaction (hives, swelling of the face and throat, difficulty breathing, a fast heartbeat, dizziness, or weakness), call 9-1-1 and get the person to the nearest hospital. For other signs that concern you, call your health care provider. Adverse reactions should be reported to the Vaccine Adverse Event Reporting System (VAERS). Your health care provider will usually file this report, or you can do it yourself. Visit the VAERS website at www.vaers.SamedayNews.es or call 801-611-5152.VAERS is only for reporting reactions, and VAERS staff do not give medical advice. 6. The National Vaccine Injury Compensation Program The [employer] Vaccine Injury Compensation Program (VICP) is a federal program that was created to compensate people who may have been injured by certain vaccines. Visit the VICP website at GoldCloset.com.ee or call 978-135-2547 to learn about the program and about filing a claim. There is a time limit to file a claim for compensation. 7. How can I learn more?  Ask your healthcare provider.  Call your local or state health department.  Contact the Centers for Disease Control and Prevention (CDC): ? Call 3202079347 (1-800-CDC-INFO) or ? Visit CDC's https://gibson.com/ Vaccine Information Statement (Interim) Inactivated Influenza Vaccine (01/26/2018) This information is not intended to replace advice given to you by your health care provider. Make sure you discuss any questions you have with your health care provider. Document Revised: 09/19/2018 Document Reviewed: 01/30/2018 Elsevier Patient Education  [place].

## 2020-05-26 NOTE — Assessment & Plan Note (Signed)
Intermittent with three episodes over the last 6-8 weeks. No rash or hives today.  Unclear etiology, but could be secondary to Praulent. She has a dose due to day and will pay close attention to see if symptoms occur within the next several days.  Checking labs today to rule out other causes.  Referral placed to allergist for evaluation. Rx for EpiPen provided, discussed instructions for use.  Discussed use of daily Zyrtec, hold for now until after Praulent.

## 2020-05-26 NOTE — Assessment & Plan Note (Signed)
Well controlled in the office today off medications. Continue to monitor.

## 2020-05-26 NOTE — Progress Notes (Signed)
Subjective:    Patient ID: Nancy [name], female    DOB: 1956-02-22, 64 y.o.   MRN: [id number]  HPI  This visit occurred during the SARS-CoV-2 public health emergency.  Safety protocols were in place, including screening questions prior to the visit, additional usage of staff PPE, and extensive cleaning of exam room while observing appropriate contact time as indicated for disinfecting solutions.   [name] is a 64 year old female with a history of hypertension, hyperlipidemia, GAD who presents today for follow up and a chief complaint of rash.  1) Hypertension: no longer managed on medication, previously managed on lisinopril, amlodipine, and metoprolol tartrate at different times. Strong family history of CAD, her personal coronary calcium score was 243. Following with cardiology with last visit being June 2021. Now on Repatha for hyperlipiemia given family history of CAD.   We suspected that anxiety had been contributing to spikes in blood pressure readings. Blood pressure eventually reduced after addition of anxiety anxiety treatment. She has been off of antihypertensive treatment since late June 2021. She is now on Praulent as Repatha wasn't as effective.   BP Readings from Last 3 Encounters:  05/26/20 124/68  12/07/19 116/74  09/06/19 102/74    2) GAD: Currently managed on citalopram 20 mg which was initiated earlier this year. Overall feeling much better on treatment, currently taking 1/2 tablet one day and 1 full tablet the next day as she felt too shaky when waking the following morning.   3) Rash: Initial episode occurred about 6-8 weeks ago. Was visiting with a friend, noticed a dry cough, then broke out in hives with itching. She took a Benadryl, went to bed, woke up and hives were resolved. Her second episode occurred a few weeks later, coughed, broke out in hives, resolved within 12 hours. Her most recent bout of hives occurred on December 1st, was at the beach with a  friend, at dinner and saw a show. She started coughing, broke out in hives, these resolved within a few hours. Hives are typically full body.   No new lotions, detergents, soaps or shampoos. No new vitamins, supplements. No new pets. No recent outdoor exposure or poison ivy exposure. No bonfire or smoke exposure.  No recent motel or hotel stay or new beds.   No fevers/chills, oral lesions, new joint pains, tick bites, abdominal pain, nausea.   She cannot identify one specific cause for her hives. She can eat pork, but did have GI upset after eating steak and gravy just prior to her last episode. She believes she was bitten by numerous deer ticks in 2020, no ticks in 2021. She was stung about 20 times by a wasp in September 2020, did not seek treatment at the time. She denies throat tightness, wheezing, shortness of breath during  episodes.   She began Praluent four weeks ago, is due for another injection today. She doesn't take Claritin as she finds it ineffective for chronic right sided nasal congestion.   Review of Systems  Eyes: Negative for visual disturbance.  Respiratory: Negative for shortness of breath and wheezing.   Cardiovascular: Negative for chest pain.  Skin:       See HPI, intermittent hives  Neurological: Negative for dizziness.  Psychiatric/Behavioral:       Overall feels well controlled on citalopram        Past Medical History:  Diagnosis Date  . Chronic neck pain 04/11/2018  . Essential hypertension 07/20/2019  . Fatigue   .  GAD (generalized anxiety disorder) 06/26/2018  . Hyperlipidemia   . Lichen sclerosus   . Overweight (BMI 25.0-29.9) 01/09/2018  . Routine general medical examination at a health care facility   . Urinary frequency 06/06/2019  . Vaginal itching 06/06/2019     Social History   Socioeconomic History  . Marital status: Single    Spouse name: Not on file  . Number of children: 2  . Years of education: Not on file  . Highest education  level: Not on file  Occupational History  . Not on file  Tobacco Use  . Smoking status: Never Smoker  . Smokeless tobacco: Never Used  Vaping Use  . Vaping Use: Never used  Substance and Sexual Activity  . Alcohol use: Yes    Alcohol/week: 0.0 standard drinks    Comment: occ  . Drug use: No  . Sexual activity: Not on file  Other Topics Concern  . Not on file  Social History Narrative   Divorced.  G3P2.   Regular exercise-yes   Social Determinants of Health   Financial Resource Strain: Not on file  Food Insecurity: Not on file  Transportation Needs: Not on file  Physical Activity: Not on file  Stress: Not on file  Social Connections: Not on file  Intimate Partner Violence: Not on file    Past Surgical History:  Procedure Laterality Date  . ANTERIOR CERVICAL DECOMP/DISCECTOMY FUSION    . PARTIAL HYSTERECTOMY    . TUBAL LIGATION      Family History  Problem Relation Age of Onset  . Hydrocephalus Mother   . Neuropathy Mother   . Diabetes Father   . Hypertension Father   . Thyroid cancer Cousin   . Thyroid disease Maternal Aunt     Allergies  Allergen Reactions  . Statins Other (See Comments)    Myalgias   . Zetia [Ezetimibe] Other (See Comments)    Myalgias    Current Outpatient Medications on File Prior to Visit  Medication Sig Dispense Refill  . Alirocumab (PRALUENT) 75 MG/ML SOAJ Inject 1 pen into the skin every 14 (fourteen) days. 6 mL 3  . cholecalciferol (VITAMIN D3) 25 MCG (1000 UT) tablet Take 1,000 Units by mouth daily.    . citalopram (CELEXA) 20 MG tablet Take 10 mg by mouth daily.    . clobetasol cream (TEMOVATE) 8.24 % Apply 1 application topically as needed.     No current facility-administered medications on file prior to visit.    BP 124/68   Pulse 73   Temp 98.2 F (36.8 C) (Temporal)   Ht 5\' 3"  (1.6 m)   Wt 149 lb (67.6 kg)   SpO2 99%   BMI 26.39 kg/m    Objective:   Physical Exam Constitutional:      Appearance: She is  well-nourished.  Cardiovascular:     Rate and Rhythm: Normal rate and regular rhythm.  Pulmonary:     Effort: Pulmonary effort is normal.     Breath sounds: Normal breath sounds.  Musculoskeletal:     Cervical back: Neck supple.  Skin:    General: Skin is warm and dry.     Findings: No rash.  Psychiatric:        Mood and Affect: Mood and affect normal.            Assessment & Plan:

## 2020-05-26 NOTE — Assessment & Plan Note (Signed)
Managed on Praulent per cardiology. Continue same for now, will be closely monitoring to see if this causes hives.

## 2020-05-26 NOTE — Assessment & Plan Note (Signed)
Seems to be doing very well on Citalopram 10 mg and 20 mg alternating days. Continue same.

## 2020-06-03 LAB — FOOD ALLERGY PROFILE
Allergen, Salmon, f41: 1.81 kU/L — ABNORMAL HIGH
Almonds: 0.18 kU/L — ABNORMAL HIGH
CLASS: 0
CLASS: 1
CLASS: 1
CLASS: 2
CLASS: 2
CLASS: 2
CLASS: 2
Cashew IgE: <0.1 kU/L
Class: 1
Class: 3
Class: 4
Egg White IgE: 0.21 kU/L — ABNORMAL HIGH
Fish Cod: 0.17 kU/L — ABNORMAL HIGH
Hazelnut: 0.3 kU/L — ABNORMAL HIGH
Milk IgE: 15 kU/L — ABNORMAL HIGH
Peanut IgE: 0.49 kU/L — ABNORMAL HIGH
Scallop IgE: 3.23 kU/L — ABNORMAL HIGH
Sesame Seed f10: 0.74 kU/L — ABNORMAL HIGH
Shrimp IgE: 27.2 kU/L — ABNORMAL HIGH
Soybean IgE: 2.72 kU/L — ABNORMAL HIGH
Tuna IgE: 0.43 kU/L — ABNORMAL HIGH
Walnut: 0.2 kU/L — ABNORMAL HIGH
Wheat IgE: 0.39 kU/L — ABNORMAL HIGH

## 2020-06-03 LAB — B. BURGDORFI ANTIBODIES BY WB

## 2020-06-03 LAB — INTERPRETATION:

## 2020-06-03 LAB — ROCKY MTN SPOTTED FVR ABS PNL(IGG+IGM)
RMSF IgG: NOT DETECTED
RMSF IgM: NOT DETECTED

## 2020-06-03 LAB — ALPHA-GAL PANEL
Beef IgE: 89.3 kU/L — ABNORMAL HIGH (ref <0.35)
Class: 4
Class: 4
Class: 5
Galactose-alpha-1,3-galactose IgE: >100 kU/L — ABNORMAL HIGH (ref <0.10)
LAMB/MUTTON IGE: 38.7 kU/L — ABNORMAL HIGH (ref <0.35)
Pork IgE: 40.1 kU/L — ABNORMAL HIGH (ref <0.35)

## 2020-07-03 ENCOUNTER — Telehealth: Payer: Self-pay | Admitting: Cardiovascular Disease

## 2020-07-03 NOTE — Telephone Encounter (Signed)
Follow Up:     Pt said she was returning Pam's call from today.

## 2020-07-03 NOTE — Telephone Encounter (Signed)
Left message for patient to call back  

## 2020-07-03 NOTE — Telephone Encounter (Signed)
Called patient back. Patient stated her BP has gotten better through out the day. BP 176/88  HR 81 (AM)  BP 151/89  HR 100 (NOON)   BP 139/78  HR 82 (NOW)  Patient stated she thinks she was just having some anxiety. Patient stated she had these 3 medications on hand. She does not know if she should take anything now, since her BP has come down.  Informed patient that maybe Dr. Acie Fredrickson can prescribe her something to take when her BP gets too high.  Lisinopril 10 mg amlidopine 5 mg metoprolol 25 mg BID  Patient stated that metoprolol has helped the most in the past. Patient stated she thinks she has a gluten intolerance, and she has stopped eating gluten the last couple of days.  Informed patient that a message would be sent to Dr. Acie Fredrickson for further advisement.

## 2020-07-03 NOTE — Telephone Encounter (Signed)
See mychart message from 07/02/20

## 2020-07-10 ENCOUNTER — Other Ambulatory Visit: Payer: Self-pay

## 2020-07-10 ENCOUNTER — Ambulatory Visit: Payer: No Typology Code available for payment source | Admitting: Allergy & Immunology

## 2020-07-10 ENCOUNTER — Encounter: Payer: Self-pay | Admitting: Allergy & Immunology

## 2020-07-10 VITALS — BP 130/80 | HR 72 | Temp 98.2°F | Resp 18 | Ht 63.0 in | Wt 145.8 lb

## 2020-07-10 DIAGNOSIS — T7800XD Anaphylactic reaction due to unspecified food, subsequent encounter: Secondary | ICD-10-CM | POA: Diagnosis not present

## 2020-07-10 DIAGNOSIS — J338 Other polyp of sinus: Secondary | ICD-10-CM | POA: Diagnosis not present

## 2020-07-10 DIAGNOSIS — J302 Other seasonal allergic rhinitis: Secondary | ICD-10-CM | POA: Insufficient documentation

## 2020-07-10 DIAGNOSIS — J309 Allergic rhinitis, unspecified: Secondary | ICD-10-CM | POA: Insufficient documentation

## 2020-07-10 DIAGNOSIS — T7800XA Anaphylactic reaction due to unspecified food, initial encounter: Secondary | ICD-10-CM | POA: Insufficient documentation

## 2020-07-10 DIAGNOSIS — J3089 Other allergic rhinitis: Secondary | ICD-10-CM | POA: Diagnosis not present

## 2020-07-10 MED ORDER — AZELASTINE HCL 0.15 % NA SOLN: 2.0000 | Freq: Two times a day (BID) | NASAL | 5 refills | Status: DC

## 2020-07-10 NOTE — Progress Notes (Signed)
NEW PATIENT  Date of Service/Encounter:  07/10/20  Referring provider: Pleas Koch, NP   Assessment:   Seasonal and perennial allergic rhinitis (weeds, trees, indoor molds, outdoor molds, dust mites, cat, dog and cockroach, horse, and mixed feathers)  ? 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  However, she has a history of chronic congestion as well and environmental allergy testing did confirm a number of sensitizations that are relevant to her life.  We are going to treat her aggressively with [facility] as well as Astelin.  Antihistamines have never really seem to be useful for her, so we will not initiate those today.  I think allergen immunotherapy would be an excellent consideration for her and she ended up calling back to confirm that she did want to start that.  Regarding her food allergies, she certainly needs to avoid mammalian meats.  We did discuss the normal course of alpha gal allergy and I recommended that we retest every year to see where her levels are trending.  Her milk was also positive both on blood testing as well as skin testing, so I would definitely avoid cows milk products.  Thankfully, there are number of dairy free alternatives on the market today.  She needs to continue to avoid shellfish as well.  I think fin fish would be fine, as long as she is careful about cross-contamination, but she does not really like seafood much at all aside from flounder.  We did provide her with an epinephrine autoinjector and an anaphylaxis management plan.  Plan/Recommendations:   1. Seasonal and perennial allergic rhinitis - Testing today showed: weeds, trees, indoor molds, outdoor molds, dust mites, cat, dog and cockroach, horse, and mixed feathers - Copy of test results provided.  - Avoidance measures provided. - Start taking: Xhance  (fluticasone) 1-2 sprays per nostril twice daily and Astelin (azelastine) 2 sprays per nostril 1-2 times daily as needed - You can use an extra dose of the antihistamine, if needed, for breakthrough symptoms.  - Consider nasal saline rinses 1-2 times daily to remove allergens from the nasal cavities as well as help with mucous clearance (this is especially helpful to do before the nasal sprays are given) - Strongly consider allergy shots as a means of long-term control. - Allergy shots "re-train" and "reset" the immune system to ignore environmental allergens and decrease the resulting immune response to those allergens (sneezing, itchy watery eyes, runny nose, nasal congestion, etc).    - Allergy shots improve symptoms in 75-85% of patients.  - We can discuss more at the next appointment if the medications are not working for you.  2. Anaphylactic shock due to food - Testing was positive to red meats (to be expected with your lab results), milk, shellfish, [place] nut, and yeast. - The yeast was so small that I am not sure it is relevant and the same goes for the [place] nut. - There is a the low positive predictive value of food allergy testing and hence the high possibility of false positives. - In contrast, food allergy testing has a high negative predictive value, therefore if testing is negative we can be relatively assured that they are indeed negative.  - Anaphylaxis management plan provided. - EpiPen training reviewed. - We will plan to get repeat testing done every year to see where her levels are trending. - I will  look into alpha gal oral immunotherapy to see if there is a protocol out there to use.  3. Return in about 4 weeks (around 08/07/2020).    Subjective:   Nancy [name] is a 65 y.o. female presenting today for evaluation of  Chief Complaint  Patient presents with  . Urticaria    Found out alpha- gal was the cause of hives October-December 1st  Has not had reactions since  she stopped eating red meat   Also has a high reaction to milk and shrimp     Nancy [name] has a history of the following: Patient Active Problem List   Diagnosis Date Noted  . Full body hives 05/26/2020  . Essential hypertension 07/20/2019  . Vaginal itching 06/06/2019  . Urinary frequency 06/06/2019  . GAD (generalized anxiety disorder) 06/26/2018  . Chronic neck pain 04/11/2018  . Overweight (BMI 25.0-29.9) 01/09/2018  . HLD (hyperlipidemia) 08/25/2010    History obtained from: chart review and patient.  Nancy [name] was referred by Nancy Koch, NP.     Nancy [name] is a 65 y.o. female presenting for an evaluation of food allergies.  She started having hives in October. She had no paid attention to what she had eaten. She thought it was related to anxiety. The third time she realized that she had eaten beef. In December she made the connection. She denies any severe GI issues but she might have some diarrhea. The first thing she noticed was a "wheezy cough". Then she started itching all over and breaking out all over. She thought that if she was breathing OK, she did not need to go to the hospital. She took two Benadryl once and it seemed to get worse. The second time, she did not take anything. Prior to October, she did not have any issues.   She does remember a tick bite around late summer 2021. In 2020, she was bit by numerous deer ticks. She worried about them causing Lyme disease. She got no deer ticks over the summer, but she was bit by a "brown tick" and she flushed it down the toilet. She did not pay attention.   She had a lab workup that demonstrated a positivity to alpha gal.     She had labs performed for a food allergy panel that demonstrated positives to the entire panel, although the highest were milk, shrimp, scallop, and salmon. She did have issues with oysters on one occasion (stomach cramps only). She does not eat much seafood. She does not eat shrimp often at  all. She does not eat a lot of fish. Flounder is about the only fish that she eats.   She does does continue to eat milk and dairy. She denies any issues regarding breaking out, but she does have some digestive issues. She has been avoiding milk and milk products for a few weeks now. She does report some hypertension with gluten exposure. She had a BP of 200/100 in spring 2021. She was never placed on regular antihypertensives and her BP was normal when she was on her gluten free diet. She tried taking it again and her BP shot up again.  She reports that she has some congestion. She reports sinus issues "forever". She reports that she has been breathing better without the milk and the gluten. She does think that dust is a trigger for her congestion. She did spend a lot of time in a tobacco barn when she was growing up. Flonase has not  worked for her nose. She has tried cetirizine and fexofenadine and it did not seem to work. She has not tried any of this lately. She has not been taking it regularly. She has tried Afrin and this works very well. But she stopped using it when she found out that it can be addictive. She has never been allergy tested in the past.   She has a lichen sclerosis. She has clobetasol to use as needed. She sees a Paediatric nurse regularly for this.   Otherwise, there is no history of other atopic diseases, including asthma, drug allergies, stinging insect allergies, eczema, urticaria or contact dermatitis. There is no significant infectious history. Vaccinations are up to date.    Past Medical History: Patient Active Problem List   Diagnosis Date Noted  . Full body hives 05/26/2020  . Essential hypertension 07/20/2019  . Vaginal itching 06/06/2019  . Urinary frequency 06/06/2019  . GAD (generalized anxiety disorder) 06/26/2018  . Chronic neck pain 04/11/2018  . Overweight (BMI 25.0-29.9) 01/09/2018  . HLD (hyperlipidemia) 08/25/2010    Medication List:  Allergies as of  07/10/2020      Reactions   Statins Other (See Comments)   Myalgias    Zetia [ezetimibe] Other (See Comments)   Myalgias      Medication List       Accurate as of July 10, 2020 12:16 PM. If you have any questions, ask your nurse or doctor.        cholecalciferol 25 MCG (1000 UNIT) tablet Commonly known as: VITAMIN D3 Take 1,000 Units by mouth daily.   citalopram 20 MG tablet Commonly known as: CELEXA Take 10 mg by mouth daily.   clobetasol cream 0.05 % Commonly known as: TEMOVATE Apply 1 application topically as needed.   EPINEPHrine 0.3 mg/0.3 mL Soaj injection Commonly known as: EPI-PEN Inject 0.3 mg into the muscle as needed for anaphylaxis.   Praluent 75 MG/ML Soaj Generic drug: Alirocumab Inject 1 pen into the skin every 14 (fourteen) days.       Birth History: non-contributory  Developmental History: non-contributory  Past Surgical History: Past Surgical History:  Procedure Laterality Date  . ANTERIOR CERVICAL DECOMP/DISCECTOMY FUSION    . PARTIAL HYSTERECTOMY    . TUBAL LIGATION       Family History: Family History  Problem Relation Age of Onset  . Hydrocephalus Mother   . Neuropathy Mother   . Diabetes Father   . Hypertension Father   . Thyroid cancer Cousin   . Thyroid disease Maternal Aunt      Social History: Anisia lives at home with her husband.  She lives in a house that is 49+ years old.  There is vinyl plank flooring throughout the home.  They have a heat pump for heating and cooling.  There are horses and cows outside of the house.  There are no dust mite covers on the bedding.  She is currently retired.  She is exposed to fumes, chemicals, and dust in her hobbies.  There is no HEPA filter in the home.  She has never used any tobacco products. 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 There are two sons who close by. She has six grandkids.     Review of Systems  Constitutional:  Negative.  Negative for chills, fever, malaise/fatigue and weight loss.  HENT: Positive for congestion and sinus pain. Negative for ear discharge and ear pain.  Positive for headaches.  Eyes: Negative for pain, discharge and redness.  Respiratory: Negative for cough, sputum production, shortness of breath and wheezing.   Cardiovascular: Negative.  Negative for chest pain and palpitations.  Gastrointestinal: Negative for abdominal pain, heartburn, nausea and vomiting.  Skin: Negative.  Negative for itching and rash.  Neurological: Negative for dizziness and headaches.  Endo/Heme/Allergies: Negative for environmental allergies. Does not bruise/bleed easily.       Objective:   Blood pressure 130/80, pulse 72, temperature 98.2 F (36.8 C), resp. rate 18, height 5\' 3"  (1.6 m), weight 145 lb 12.8 oz (66.1 kg), SpO2 97 %. Body mass index is 25.83 kg/m.   Physical Exam:   Physical Exam Constitutional:      Appearance: She is well-developed.  HENT:     Head: Normocephalic and atraumatic.     Right Ear: Tympanic membrane, ear canal and external ear normal. No drainage, swelling or tenderness. Tympanic membrane is not injected, scarred, erythematous, retracted or bulging.     Left Ear: Tympanic membrane, ear canal and external ear normal. No drainage, swelling or tenderness. Tympanic membrane is not injected, scarred, erythematous, retracted or bulging.     Nose: No nasal deformity, septal deviation, mucosal edema, rhinorrhea or epistaxis.     Right Turbinates: Enlarged and swollen.     Left Turbinates: Enlarged and swollen.     Right Sinus: No maxillary sinus tenderness or frontal sinus tenderness.     Left Sinus: No maxillary sinus tenderness or frontal sinus tenderness.     Comments: No definite polyps appreciated, but her turbinates are so large it was difficult to see past them.  She did have a white appearing growth in her left nasal cavity which could be a polyp.  But again, it  was difficult to get a clear visualization.  She did have quite a bit of clear rhinorrhea as well as cobblestoning.    Mouth/Throat:     Mouth: Oropharynx is clear and moist. Mucous membranes are not pale and not dry.     Pharynx: Uvula midline.  Eyes:     General:        Right eye: No discharge.        Left eye: No discharge.     Extraocular Movements: EOM normal.     Conjunctiva/sclera: Conjunctivae normal.     Right eye: Right conjunctiva is not injected. No chemosis.    Left eye: Left conjunctiva is not injected. No chemosis.    Pupils: Pupils are equal, round, and reactive to light.  Cardiovascular:     Rate and Rhythm: Normal rate and regular rhythm.     Heart sounds: Normal heart sounds.  Pulmonary:     Effort: Pulmonary effort is normal. No tachypnea, accessory muscle usage or respiratory distress.     Breath sounds: Normal breath sounds. No wheezing, rhonchi or rales.  Chest:     Chest wall: No tenderness.  Abdominal:     Tenderness: There is no abdominal tenderness. There is no guarding or rebound.  Lymphadenopathy:     Head:     Right side of head: No submandibular, tonsillar or occipital adenopathy.     Left side of head: No submandibular, tonsillar or occipital adenopathy.     Cervical: No cervical adenopathy.  Skin:    Coloration: Skin is not pale.     Findings: No abrasion, erythema, petechiae or rash. Rash is not papular, urticarial or vesicular.  Neurological:     Mental Status: She  is alert.  Psychiatric:        Mood and Affect: Mood and affect normal.      Diagnostic studies:      Allergy Studies:     Airborne Adult Perc - 07/10/20 0904    Time Antigen Placed 7169    Allergen Manufacturer Lavella Hammock    Location Back    Number of Test 59    Panel 1 Select    1. Control-Buffer 50% Glycerol Negative    2. Control-Histamine 1 mg/ml 2+    3. Albumin saline Negative    4. [place] Negative    5. [place] Negative    6. Johnson Negative    7. [place] Blue  Negative    8. Meadow Fescue Negative    9. Perennial Rye Negative    10. Sweet Vernal Negative    11. Timothy Negative    12. Cocklebur Negative    13. Burweed Marshelder Negative    14. Ragweed, short Negative    15. Ragweed, Giant Negative    16. Plantain,  English 2+    17. Lamb's Quarters Negative    18. Sheep Sorrell Negative    19. Rough Pigweed 2+    20. Marsh Elder, Rough 2+    21. Mugwort, Common Negative    22. Ash mix Negative    23. Birch mix 2+    24. Beech American Negative    25. Box, Elder Negative    26. Cedar, red 2+    27. Cottonwood, [place] Negative    28. Elm mix 2+    29. Hickory 2+    30. Maple mix 2+    31. Oak, [place] mix 2+    32. Pecan Pollen Negative    33. Pine mix Negative    34. Sycamore Eastern Negative    35. Clarissa, Black Pollen Negative    36. Alternaria alternata 2+    37. Cladosporium Herbarum Negative    38. Aspergillus mix 2+    39. Penicillium mix 2+    40. Bipolaris sorokiniana (Helminthosporium) 2+    41. Drechslera spicifera (Curvularia) 2+    42. Mucor plumbeus 2+    43. Fusarium moniliforme Negative    44. Aureobasidium pullulans (pullulara) 2+    45. Rhizopus oryzae Negative    46. Botrytis cinera 2+    47. Epicoccum nigrum Negative    48. Phoma betae 2+    49. Candida Albicans 2+    50. Trichophyton mentagrophytes Negative    51. Mite, D Farinae  5,000 AU/ml 4+    52. Mite, D Pteronyssinus  5,000 AU/ml 4+    53. Cat Hair 10,000 BAU/ml 2+    54.  Dog Epithelia 2+    55. Mixed Feathers 2+    56. Horse Epithelia 4+    57. Cockroach, German 3+    58. Mouse Negative    59. Tobacco Leaf Negative          Intradermal - 07/10/20 0950    Time Antigen Placed 6789    Allergen Manufacturer Lavella Hammock    Location Arm    Number of Test 5    Intradermal Select    Control Negative    [place] 4+    Johnson 2+    7 Grass 2+    Ragweed mix Negative          Food Adult Perc - 07/10/20 0900    Time Antigen Placed 3810     Allergen Manufacturer Lavella Hammock  Location Back    Number of allergen test 72     Control-buffer 50% Glycerol Negative    Control-Histamine 1 mg/ml 2+    1. Peanut Negative    2. Soybean Negative    3. Wheat Negative    4. Sesame Negative    5. Milk, cow --   4x5   6. Egg White, Chicken Negative    7. Casein --   4x5   8. Shellfish Mix Negative    9. Fish Mix Negative    10. Cashew Negative    11. Pecan Food Negative    12. [place] Negative    13. Almond Negative    14. Hazelnut Negative    15. [place] nut --   3x5   16. Coconut Negative    17. Pistachio Negative    18. Catfish Negative    19. Bass Negative    20. Trout Negative    21. Tuna Negative    22. Salmon Negative    23. Flounder Negative    24. Codfish Negative    25. Shrimp --   8x10   26. Crab --   6x9   27. Lobster --   5x10   28. Oyster Negative    29. Scallops --   7x12   30. Barley Negative    31. Oat  Negative    32. Rye  Negative    33. Hops Negative    34. Rice Negative    35. Cottonseed Negative    36. Saccharomyces Cerevisiae  --   3x5   37. Pork Negative    38. [place] Meat Negative    39. Chicken Meat Negative    40. Beef --   5x7   41. Lamb --   6x9   42. Tomato Negative    43. White Potato Negative    44. Sweet Potato Negative    45. Pea, Green/English Negative    46. Navy Bean Negative    47. Mushrooms Negative    48. Avocado Negative    49. Onion Negative    50. Cabbage Negative    51. Carrots Negative    52. Celery Negative    53. Corn Negative    54. Cucumber Negative    55. Grape (White seedless) Negative    56. Orange  Negative    57. Banana Negative    58. Apple Negative    59. Peach Negative    60. Strawberry Negative    61. Cantaloupe Negative    62. Watermelon Negative    63. Pineapple Negative    64. Chocolate/Cacao bean Negative    65. Karaya Gum Negative    66. Acacia (Arabic Gum) Negative    67. Cinnamon Negative    68. Nutmeg Negative    69. Ginger Negative     70. Garlic Negative    71. Pepper, black Negative    72. Mustard Negative           Allergy testing results were read and interpreted by myself, documented by clinical staff.         Salvatore Marvel, MD [facility]

## 2020-07-10 NOTE — Patient Instructions (Addendum)
1. Seasonal and perennial allergic rhinitis - Testing today showed: weeds, trees, indoor molds, outdoor molds, dust mites, cat, dog and cockroach, horse, and mixed feathers - Copy of test results provided.  - Avoidance measures provided. - Start taking: Xhance (fluticasone) 1-2 sprays per nostril twice daily and Astelin (azelastine) 2 sprays per nostril 1-2 times daily as needed - You can use an extra dose of the antihistamine, if needed, for breakthrough symptoms.  - Consider nasal saline rinses 1-2 times daily to remove allergens from the nasal cavities as well as help with mucous clearance (this is especially helpful to do before the nasal sprays are given) - Strongly consider allergy shots as a means of long-term control. - Allergy shots "re-train" and "reset" the immune system to ignore environmental allergens and decrease the resulting immune response to those allergens (sneezing, itchy watery eyes, runny nose, nasal congestion, etc).    - Allergy shots improve symptoms in 75-85% of patients.  - We can discuss more at the next appointment if the medications are not working for you.  2. Anaphylactic shock due to food - Testing was positive to red meats (to be expected with your lab results), milk, shellfish, [place] nut, and yeast. - The yeast was so small that I am not sure it is relevant and the same goes for the [place] nut. - There is a the low positive predictive value of food allergy testing and hence the high possibility of false positives. - In contrast, food allergy testing has a high negative predictive value, therefore if testing is negative we can be relatively assured that they are indeed negative.  - Anaphylaxis management plan provided. - EpiPen training reviewed. - We will plan to get repeat testing done every year to see where her levels are trending. - I will look into alpha gal oral immunotherapy to see if there is a protocol out there to use.  3. Return in about 4 weeks  (around 08/07/2020).    Please inform us of any Emergency Department visits, hospitalizations, or changes in symptoms. Call us before going to the ED for breathing or allergy symptoms since we might be able to fit you in for a sick visit. Feel free to contact us anytime with any questions, problems, or concerns.  It was a pleasure to meet you today!  Websites that have reliable patient information: 1. American Academy of Asthma, Allergy, and Immunology: www.aaaai.org 2. Food Allergy Research and Education (FARE): foodallergy.org 3. Mothers of Asthmatics: http://www.asthmacommunitynetwork.org 4. American College of Allergy, Asthma, and Immunology: www.acaai.org   COVID-19 Vaccine Information can be found at: [email or web address].uk For questions related to vaccine distribution or appointments, please email [email or web address] or call [phone].     "Like" [place] on Facebook and Instagram for our latest updates!       Make sure you are registered to vote! If you have moved or changed any of your contact information, you will need to get this updated before voting!  In some cases, you MAY be able to register to vote online: [email or web address]     Reducing Pollen Exposure  The American Academy of Allergy, Asthma and Immunology suggests the following steps to reduce your exposure to pollen during allergy seasons.    1. Do not hang sheets or clothing out to dry; pollen may collect on these items. 2. Do not mow lawns or spend time around freshly cut grass; mowing stirs up pollen. 3. Keep windows closed at night.  Keep car windows closed while driving. 4.  Minimize morning activities outdoors, a time when pollen counts are usually at their highest. 5. Stay indoors as much as possible when pollen counts or humidity is high and on windy days when pollen tends to remain in the air longer. 6. Use air  conditioning when possible.  Many air conditioners have filters that trap the pollen spores. 7. Use a HEPA room air filter to remove pollen form the indoor air you breathe.  Control of Mold Allergen   Mold and fungi can grow on a variety of surfaces provided certain temperature and moisture conditions exist.  Outdoor molds grow on plants, decaying vegetation and soil.  The major outdoor mold, Alternaria and Cladosporium, are found in very high numbers during hot and dry conditions.  Generally, a late Summer - Fall peak is seen for common outdoor fungal spores.  Rain will temporarily lower outdoor mold spore count, but counts rise rapidly when the rainy period ends.  The most important indoor molds are Aspergillus and Penicillium.  Dark, humid and poorly ventilated basements are ideal sites for mold growth.  The next most common sites of mold growth are the bathroom and the kitchen.  Outdoor (Seasonal) Mold Control  Positive outdoor molds via skin testing: Alternaria, Bipolaris (Helminthsporium), Drechslera (Curvalaria) and Mucor  1. Use air conditioning and keep windows closed 2. Avoid exposure to decaying vegetation. 3. Avoid leaf raking. 4. Avoid grain handling. 5. Consider wearing a face mask if working in moldy areas.  6.   Indoor (Perennial) Mold Control   Positive indoor molds via skin testing: Aspergillus, Penicillium, Aureobasidium (Pullulara), Botrytis, Phoma and Candida  1. Maintain humidity below 50%. 2. Clean washable surfaces with 5% bleach solution. 3. Remove sources e.g. contaminated carpets.     Control of Dust Mite Allergen    Dust mites play a major role in allergic asthma and rhinitis.  They occur in environments with high humidity wherever human skin is found.  Dust mites absorb humidity from the atmosphere (ie, they do not drink) and feed on organic matter (including shed human and animal skin).  Dust mites are a microscopic type of insect that you cannot see  with the naked eye.  High levels of dust mites have been detected from mattresses, pillows, carpets, upholstered furniture, bed covers, clothes, soft toys and any woven material.  The principal allergen of the dust mite is found in its feces.  A gram of dust may contain 1,000 mites and 250,000 fecal particles.  Mite antigen is easily measured in the air during house cleaning activities.  Dust mites do not bite and do not cause harm to humans, other than by triggering allergies/asthma.    Ways to decrease your exposure to dust mites in your home:  1. Encase mattresses, box springs and pillows with a mite-impermeable barrier or cover   2. Wash sheets, blankets and drapes weekly in hot water (130 F) with detergent and dry them in a dryer on the hot setting.  3. Have the room cleaned frequently with a vacuum cleaner and a damp dust-mop.  For carpeting or rugs, vacuuming with a vacuum cleaner equipped with a high-efficiency particulate air (HEPA) filter.  The dust mite allergic individual should not be in a room which is being cleaned and should wait 1 hour after cleaning before going into the room. 4. Do not sleep on upholstered furniture (eg, couches).   5. If possible removing carpeting, upholstered furniture and drapery from the home is ideal.  Horizontal blinds should be  eliminated in the rooms where the person spends the most time (bedroom, study, television room).  Washable vinyl, roller-type shades are optimal. 6. Remove all non-washable stuffed toys from the bedroom.  Wash stuffed toys weekly like sheets and blankets above.   7. Reduce indoor humidity to less than 50%.  Inexpensive humidity monitors can be purchased at most hardware stores.  Do not use a humidifier as can make the problem worse and are not recommended.  Control of Dog or Cat Allergen  Avoidance is the best way to manage a dog or cat allergy. If you have a dog or cat and are allergic to dog or cats, consider removing the dog or cat  from the home. If you have a dog or cat but don't want to find it a new home, or if your family wants a pet even though someone in the household is allergic, here are some strategies that may help keep symptoms at bay:  1. Keep the pet out of your bedroom and restrict it to only a few rooms. Be advised that keeping the dog or cat in only one room will not limit the allergens to that room. 2. Don't pet, hug or kiss the dog or cat; if you do, wash your hands with soap and water. 3. High-efficiency particulate air (HEPA) cleaners run continuously in a bedroom or living room can reduce allergen levels over time. 4. Regular use of a high-efficiency vacuum cleaner or a central vacuum can reduce allergen levels. 5. Giving your dog or cat a bath at least once a week can reduce airborne allergen.  Control of Cockroach Allergen  Cockroach allergen has been identified as an important cause of acute attacks of asthma, especially in urban settings.  There are fifty-five species of cockroach that exist in the [place], however only three, the [place], [place] species produce allergen that can affect patients with Asthma.  Allergens can be obtained from fecal particles, egg casings and secretions from cockroaches.    1. Remove food sources. 2. Reduce access to water. 3. Seal access and entry points. 4. Spray runways with 0.5-1% Diazinon or Chlorpyrifos 5. Blow boric acid power under stoves and refrigerator. 6. Place bait stations (hydramethylnon) at feeding sites.  Allergy Shots   Allergies are the result of a chain reaction that starts in the immune system. Your immune system controls how your body defends itself. For instance, if you have an allergy to pollen, your immune system identifies pollen as an invader or allergen. Your immune system overreacts by producing antibodies called Immunoglobulin E (IgE). These antibodies travel to cells that release chemicals, causing an allergic  reaction.  The concept behind allergy immunotherapy, whether it is received in the form of shots or tablets, is that the immune system can be desensitized to specific allergens that trigger allergy symptoms. Although it requires time and patience, the payback can be long-term relief.  How Do Allergy Shots Work?  Allergy shots work much like a vaccine. Your body responds to injected amounts of a particular allergen given in increasing doses, eventually developing a resistance and tolerance to it. Allergy shots can lead to decreased, minimal or no allergy symptoms.  There generally are two phases: build-up and maintenance. Build-up often ranges from three to six months and involves receiving injections with increasing amounts of the allergens. The shots are typically given once or twice a week, though more rapid build-up schedules are sometimes used.  The maintenance phase begins when the most  effective dose is reached. This dose is different for each person, depending on how allergic you are and your response to the build-up injections. Once the maintenance dose is reached, there are longer periods between injections, typically two to four weeks.  Occasionally doctors give cortisone-type shots that can temporarily reduce allergy symptoms. These types of shots are different and should not be confused with allergy immunotherapy shots.  Who Can Be Treated with Allergy Shots?  Allergy shots may be a good treatment approach for people with allergic rhinitis (hay fever), allergic asthma, conjunctivitis (eye allergy) or stinging insect allergy.   Before deciding to begin allergy shots, you should consider:  . The length of allergy season and the severity of your symptoms . Whether medications and/or changes to your environment can control your symptoms . Your desire to avoid long-term medication use . Time: allergy immunotherapy requires a major time commitment . Cost: may vary depending on your  insurance coverage  Allergy shots for children age 53 and older are effective and often well tolerated. They might prevent the onset of new allergen sensitivities or the progression to asthma.  Allergy shots are not started on patients who are pregnant but can be continued on patients who become pregnant while receiving them. In some patients with other medical conditions or who take certain common medications, allergy shots may be of risk. It is important to mention other medications you talk to your allergist.   When Will I Feel Better?  Some may experience decreased allergy symptoms during the build-up phase. For others, it may take as long as 12 months on the maintenance dose. If there is no improvement after a year of maintenance, your allergist will discuss other treatment options with you.  If you aren't responding to allergy shots, it may be because there is not enough dose of the allergen in your vaccine or there are missing allergens that were not identified during your allergy testing. Other reasons could be that there are high levels of the allergen in your environment or major exposure to non-allergic triggers like tobacco smoke.  What Is the Length of Treatment?  Once the maintenance dose is reached, allergy shots are generally continued for three to five years. The decision to stop should be discussed with your allergist at that time. Some people may experience a permanent reduction of allergy symptoms. Others may relapse and a longer course of allergy shots can be considered.  What Are the Possible Reactions?  The two types of adverse reactions that can occur with allergy shots are local and systemic. Common local reactions include very mild redness and swelling at the injection site, which can happen immediately or several hours after. A systemic reaction, which is less common, affects the entire body or a particular body system. They are usually mild and typically respond  quickly to medications. Signs include increased allergy symptoms such as sneezing, a stuffy nose or hives.  Rarely, a serious systemic reaction called anaphylaxis can develop. Symptoms include swelling in the throat, wheezing, a feeling of tightness in the chest, nausea or dizziness. Most serious systemic reactions develop within 30 minutes of allergy shots. This is why it is strongly recommended you wait in your doctor's office for 30 minutes after your injections. Your allergist is trained to watch for reactions, and his or her staff is trained and equipped with the proper medications to identify and treat them.  Who Should Administer Allergy Shots?  The preferred location for receiving shots is your  prescribing allergist's office. Injections can sometimes be given at another facility where the physician and staff are trained to recognize and treat reactions, and have received instructions by your prescribing allergist.

## 2020-07-14 DIAGNOSIS — J3081 Allergic rhinitis due to animal (cat) (dog) hair and dander: Secondary | ICD-10-CM

## 2020-07-14 NOTE — Progress Notes (Signed)
Aeroallergen Immunotherapy     Patient Details  Name: Nancy [name]  MRN: [id number]  Date of Birth: 10/13/1955   Order 1 of 2   Vial Label: G/W/T/C/D/H   0.3 ml (Volume) BAU Concentration -- 7 Grass Mix* 100,000 ([address] [place], [place], [place], [place], [place], Sweet Vernal, Timothy)  0.3 ml (Volume) BAU Concentration -- [place] 10,000  0.2 ml (Volume) 1:20 Concentration -- Johnson  0.2 ml (Volume) 1:20 Concentration -- Rough Pigweed*  0.2 ml (Volume) 1:20 Concentration -- [place] elder, Rough*  0.2 ml (Volume) 1:10 Concentration -- Wendee Copp mix*  0.2 ml (Volume) 1:20 Concentration -- Elm Mix*  0.2 ml (Volume) 1:10 Concentration -- [place]*  0.2 ml (Volume) 1:20 Concentration -- Maple Mix*  0.3 ml (Volume) 1:10 Concentration -- Oak, [place] mix*  0.5 ml (Volume) 1:10 Concentration -- Cat Hair  0.5 ml (Volume) 1:10 Concentration -- Dog Epithelia  0.3 ml (Volume) 1:10 Concentration -- Horse Epithelia    3.6 ml Extract Subtotal  1.4 ml Diluent  5.0 ml Maintenance Total    Final Concentration above is stated in weight/volume (wt/vol). Allergen units (AU/ml) biological units (BAU/ml). The total volume is 5 ml.    Schedule: A   Special Instructions: none

## 2020-07-14 NOTE — Progress Notes (Signed)
VIALS EXP 07-14-21 

## 2020-07-14 NOTE — Progress Notes (Signed)
Aeroallergen Immunotherapy    Patient Details  Name: Nancy [name]  MRN: [id number]  Date of Birth: January 17, 1956   Order 2 of 2   Vial Label: Molds/DM/CR   0.2 ml (Volume) 1:20 Concentration -- Alternaria alternata  0.2 ml (Volume) 1:10 Concentration -- Aspergillus mix  0.2 ml (Volume) 1:10 Concentration -- Penicillium mix  0.2 ml (Volume) 1:20 Concentration -- Bipolaris sorokiniana  0.2 ml (Volume) 1:20 Concentration -- Drechslera spicifera  0.2 ml (Volume) 1:10 Concentration -- Mucor plumbeus  0.2 ml (Volume) 1:40 Concentration -- Aureobasidium pullulans  0.2 ml (Volume) 1:40 Concentration -- Phoma betae  0.3 ml (Volume) 1:20 Concentration -- Cockroach, [place]  0.5 ml (Volume)  AU Concentration -- Mite Mix (DF 5,000 & DP 5,000)    2.4 ml Extract Subtotal  2.6 ml Diluent  5.0 ml Maintenance Total    Final Concentration above is stated in weight/volume (wt/vol). Allergen units (AU/ml) biological units (BAU/ml). The total volume is 5 ml.    Schedule: A   Special Instructions: none

## 2020-07-15 DIAGNOSIS — J3089 Other allergic rhinitis: Secondary | ICD-10-CM | POA: Diagnosis not present

## 2020-07-16 ENCOUNTER — Other Ambulatory Visit: Payer: Self-pay | Admitting: Primary Care

## 2020-07-16 DIAGNOSIS — F411 Generalized anxiety disorder: Secondary | ICD-10-CM

## 2020-07-17 ENCOUNTER — Telehealth: Payer: Self-pay

## 2020-07-17 DIAGNOSIS — E782 Mixed hyperlipidemia: Secondary | ICD-10-CM

## 2020-07-17 NOTE — Telephone Encounter (Signed)
Called pt to schedule lipi labs pt stated that they will complete at pcp

## 2020-07-17 NOTE — Telephone Encounter (Signed)
-----   Message from Ramond Dial, [place] sent at 07/17/2020 11:04 AM EST -----  ----- Message ----- From: Ramond Dial, RPH-CPP Sent: 07/17/2020  12:00 AM EST To: Ramond Dial, RPH-CPP  Set up lipidsW

## 2020-07-24 ENCOUNTER — Other Ambulatory Visit: Payer: Self-pay | Admitting: Primary Care

## 2020-07-29 ENCOUNTER — Other Ambulatory Visit: Payer: Self-pay

## 2020-07-29 ENCOUNTER — Ambulatory Visit (INDEPENDENT_AMBULATORY_CARE_PROVIDER_SITE_OTHER): Payer: PPO | Admitting: Allergy & Immunology

## 2020-07-29 ENCOUNTER — Encounter: Payer: Self-pay | Admitting: Allergy & Immunology

## 2020-07-29 VITALS — BP 122/78 | HR 70 | Temp 97.5°F | Resp 18 | Ht 63.0 in | Wt 147.6 lb

## 2020-07-29 DIAGNOSIS — T7800XD Anaphylactic reaction due to unspecified food, subsequent encounter: Secondary | ICD-10-CM | POA: Diagnosis not present

## 2020-07-29 DIAGNOSIS — J3089 Other allergic rhinitis: Secondary | ICD-10-CM | POA: Diagnosis not present

## 2020-07-29 DIAGNOSIS — J302 Other seasonal allergic rhinitis: Secondary | ICD-10-CM | POA: Diagnosis not present

## 2020-07-29 NOTE — Patient Instructions (Addendum)
1. Seasonal and perennial allergic rhinitis (weeds, trees, indoor molds, outdoor molds, dust mites, cat, dog and cockroach, horse, and mixed feathers) - I think it is OK to not do allergy shots for now. - You can certainly restart the nose sprays in the spring if symptoms get worse. - We can start the allergy shots if your symptoms get bad enough.   2. Anaphylactic shock due to food - Continue to avoid red meats, dairy, and soy since you are doing so well with this. - EpiPen is up to date. - We will retest in one year and see where the levels are hanging out.   3. Return in about 1 year (around 07/29/2021).    Please inform us of any Emergency Department visits, hospitalizations, or changes in symptoms. Call us before going to the ED for breathing or allergy symptoms since we might be able to fit you in for a sick visit. Feel free to contact us anytime with any questions, problems, or concerns.  It was a pleasure to see you again today!  Websites that have reliable patient information: 1. American Academy of Asthma, Allergy, and Immunology: www.aaaai.org 2. Food Allergy Research and Education (FARE): foodallergy.org 3. Mothers of Asthmatics: http://www.asthmacommunitynetwork.org 4. American College of Allergy, Asthma, and Immunology: www.acaai.org   COVID-19 Vaccine Information can be found at: [email or web address] For questions related to vaccine distribution or appointments, please email [email or web address] or call [phone].   We realize that you might be concerned about having an allergic reaction to the COVID19 vaccines. To help with that concern, WE ARE OFFERING THE COVID19 VACCINES IN OUR OFFICE! Ask the front desk for dates!     "Like" [place] on Facebook and Instagram for our latest updates!      A healthy democracy works best when [employer] participate! Make sure you are registered to vote! If you have moved or  changed any of your contact information, you will need to get this updated before voting!  In some cases, you MAY be able to register to vote online: [email or web address]

## 2020-07-29 NOTE — Progress Notes (Signed)
FOLLOW UP  Date of Service/Encounter:  07/29/20   Assessment:   Seasonal and perennial allergic rhinitis (weeds, trees, indoor molds, outdoor molds, dust mites, cat, dog and cockroach, horse, and mixed feathers)  ? 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 Seasonal and perennial allergic rhinitis (weeds, trees, indoor molds, outdoor molds, dust mites, cat, dog and cockroach, horse, and mixed feathers) - I think it is OK to not do allergy shots for now. - You can certainly restart the nose sprays in the spring if symptoms get worse. - We can start the allergy shots if your symptoms get bad enough.   2. Anaphylactic shock due to food - Continue to avoid red meats, dairy, and soy since you are doing so well with this. - EpiPen is up to date. - We will retest in one year and see where the levels are hanging out.   3. Return in about 1 year (around 07/29/2021).   Subjective:   Nancy [name] is a 65 y.o. female presenting today for follow up of  Chief Complaint  Patient presents with  . Allergic Rhinitis     No concerns today wants to talk about medications and symptoms     Nancy [name] has a history of the following: Patient Active Problem List   Diagnosis Date Noted  . Seasonal and perennial allergic rhinitis 07/10/2020  . 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  . Other polyp of sinus 07/10/2020  . Full body hives 05/26/2020  . Essential hypertension 07/20/2019  . Vaginal itching 06/06/2019  . Urinary frequency 06/06/2019  . GAD (generalized anxiety disorder) 06/26/2018  . Chronic neck pain 04/11/2018  . Overweight (BMI 25.0-29.9) 01/09/2018  . HLD (hyperlipidemia) 08/25/2010    History obtained from: chart review and patient.  Nancy [name] is a 65 y.o. female presenting for a follow up visit. She was last seen in January 2022. At that time, she had testing that was positive to  weeds, trees, molds, dust mites, cat, dog, cockroach, horse, and mixed feathers.  We started her on XHANCE 1 to 2 sprays per nostril twice daily as well as Astelin 2 sprays per nostril up to twice daily.  She had testing that was positive to red meats as well as milk, shellfish, [place] nut, and yeast.  We recommended avoiding mammalian meats as well as cow's milk.   Since the last visit, she has done well.   Allergic Rhinitis Symptom History: She was on the nasal sprays for a couple of weeks.  She used them consistently and noticed that the Astelin made her sneeze and the Flonase made her feel more congested.  She ended up stopping them both and has not noticed any change in her symptoms without having them on board.  She has not used any antihistamines.  She has not needed antibiotics.  She was going to start allergy shots, but then decided to hold off since she was doing okay.  She says she might change her mind in the springtime.  Food Allergy Symptom History: She avoids the butter and all cow's milk now. She is avoiding mammalian meat.  She did have an accidental exposure to butter at one time, but developed really terrible symptoms.  She does have an up-to-date EpiPen.  She is wondering about the natural course of alpha gal allergies.  Her last blood test was done in February 2020 and was highly positive  with an IgE of greater than 100 alpha gal.  Otherwise, there have been no changes to her past medical history, surgical history, family history, or social history.    Review of Systems  Constitutional: Negative.  Negative for chills, fever, malaise/fatigue and weight loss.  HENT: Negative for congestion, ear discharge, ear pain and sinus pain.   Eyes: Negative for pain, discharge and redness.  Respiratory: Negative for cough, sputum production, shortness of breath and wheezing.   Cardiovascular: Negative.  Negative for chest pain and palpitations.  Gastrointestinal: Negative for abdominal  pain, constipation, diarrhea, heartburn, nausea and vomiting.  Skin: Positive for itching and rash.  Neurological: Negative for dizziness and headaches.  Endo/Heme/Allergies: Positive for environmental allergies. Does not bruise/bleed easily.       Objective:   Blood pressure 122/78, pulse 70, temperature (!) 97.5 F (36.4 C), resp. rate 18, height 5\' 3"  (1.6 m), weight 147 lb 9.6 oz (67 kg), SpO2 98 %. Body mass index is 26.15 kg/m.   Physical Exam:  Physical Exam Constitutional:      Appearance: She is well-developed.     Comments: Very friendly female.  HENT:     Head: Normocephalic and atraumatic.     Right Ear: Tympanic membrane, ear canal and external ear normal.     Left Ear: Tympanic membrane, ear canal and external ear normal.     Nose: No nasal deformity, septal deviation, mucosal edema, rhinorrhea or epistaxis.     Right Turbinates: Enlarged and swollen.     Left Turbinates: Enlarged and swollen.     Right Sinus: No maxillary sinus tenderness or frontal sinus tenderness.     Left Sinus: No maxillary sinus tenderness or frontal sinus tenderness.     Comments: Clear rhinorrhea bilaterally.  Difficult to see the polyp.    Mouth/Throat:     Mouth: Oropharynx is clear and moist. Mucous membranes are not pale and not dry.     Pharynx: Uvula midline.  Eyes:     General:        Right eye: No discharge.        Left eye: No discharge.     Extraocular Movements: EOM normal.     Conjunctiva/sclera: Conjunctivae normal.     Right eye: Right conjunctiva is not injected. No chemosis.    Left eye: Left conjunctiva is not injected. No chemosis.    Pupils: Pupils are equal, round, and reactive to light.  Cardiovascular:     Rate and Rhythm: Normal rate and regular rhythm.     Heart sounds: Normal heart sounds.  Pulmonary:     Effort: Pulmonary effort is normal. No tachypnea, accessory muscle usage or respiratory distress.     Breath sounds: Normal breath sounds. No wheezing,  rhonchi or rales.     Comments: Moving air well in all lung fields.  No increased work of breathing. Chest:     Chest wall: No tenderness.  Lymphadenopathy:     Cervical: No cervical adenopathy.  Skin:    Coloration: Skin is not pale.     Findings: No abrasion, erythema, petechiae or rash. Rash is not papular, urticarial or vesicular.  Neurological:     Mental Status: She is alert.  Psychiatric:        Mood and Affect: Mood and affect normal.      Diagnostic studies: none     Salvatore Marvel, MD  [facility]

## 2020-08-06 ENCOUNTER — Encounter: Payer: Self-pay | Admitting: Primary Care

## 2020-08-06 ENCOUNTER — Ambulatory Visit (INDEPENDENT_AMBULATORY_CARE_PROVIDER_SITE_OTHER): Payer: PPO | Admitting: Primary Care

## 2020-08-06 ENCOUNTER — Other Ambulatory Visit: Payer: Self-pay

## 2020-08-06 VITALS — BP 122/74 | HR 75 | Temp 98.4°F | Ht 63.0 in | Wt 149.0 lb

## 2020-08-06 DIAGNOSIS — Z114 Encounter for screening for human immunodeficiency virus [HIV]: Secondary | ICD-10-CM

## 2020-08-06 DIAGNOSIS — Z0000 Encounter for general adult medical examination without abnormal findings: Secondary | ICD-10-CM

## 2020-08-06 DIAGNOSIS — Z23 Encounter for immunization: Secondary | ICD-10-CM

## 2020-08-06 DIAGNOSIS — L509 Urticaria, unspecified: Secondary | ICD-10-CM

## 2020-08-06 DIAGNOSIS — Z1211 Encounter for screening for malignant neoplasm of colon: Secondary | ICD-10-CM

## 2020-08-06 DIAGNOSIS — F411 Generalized anxiety disorder: Secondary | ICD-10-CM | POA: Diagnosis not present

## 2020-08-06 DIAGNOSIS — R8290 Unspecified abnormal findings in urine: Secondary | ICD-10-CM | POA: Diagnosis not present

## 2020-08-06 DIAGNOSIS — I10 Essential (primary) hypertension: Secondary | ICD-10-CM

## 2020-08-06 DIAGNOSIS — T7800XS Anaphylactic reaction due to unspecified food, sequela: Secondary | ICD-10-CM

## 2020-08-06 DIAGNOSIS — E2839 Other primary ovarian failure: Secondary | ICD-10-CM

## 2020-08-06 DIAGNOSIS — Z0001 Encounter for general adult medical examination with abnormal findings: Secondary | ICD-10-CM | POA: Insufficient documentation

## 2020-08-06 DIAGNOSIS — E782 Mixed hyperlipidemia: Secondary | ICD-10-CM | POA: Diagnosis not present

## 2020-08-06 DIAGNOSIS — Z1231 Encounter for screening mammogram for malignant neoplasm of breast: Secondary | ICD-10-CM

## 2020-08-06 LAB — LIPID PANEL
Cholesterol: 185 mg/dL (ref 0–200)
HDL: 71.6 mg/dL (ref >39.00)
LDL Cholesterol: 89 mg/dL (ref 0–99)
NonHDL: 113.21
Total CHOL/HDL Ratio: 3
Triglycerides: 123 mg/dL (ref 0.0–149.0)
VLDL: 24.6 mg/dL (ref 0.0–40.0)

## 2020-08-06 LAB — POC URINALSYSI DIPSTICK (AUTOMATED)
Bilirubin, UA: NEGATIVE
Blood, UA: NEGATIVE
Glucose, UA: NEGATIVE
Ketones, UA: NEGATIVE
Nitrite, UA: NEGATIVE
Protein, UA: NEGATIVE
Spec Grav, UA: 1.01 (ref 1.010–1.025)
Urobilinogen, UA: 0.2 E.U./dL
pH, UA: 8 (ref 5.0–8.0)

## 2020-08-06 LAB — VITAMIN B12: Vitamin B-12: 164 pg/mL — ABNORMAL LOW (ref 211–911)

## 2020-08-06 NOTE — Assessment & Plan Note (Signed)
Well controlled in the office today, continue off medications. Following with cardiology.

## 2020-08-06 NOTE — Assessment & Plan Note (Signed)
Declines Shingrix. Agrees to Prevnar 13 which was provided today. Pneumovax due next year. Mammogram and bone density scan due, orders placed. Colonoscopy due, she declines but opts for Cologuard. Kit will be sent to house.   Discussed the importance of a healthy diet and regular exercise in order for weight loss, and to reduce the risk of any potential medical problems.  Exam today unremarkable. Labs pending.

## 2020-08-06 NOTE — Assessment & Plan Note (Signed)
Acute for a few days, now resolved. UA today with 2+ leuks, otherwise negative. Culture sent. Await results.

## 2020-08-06 NOTE — Addendum Note (Signed)
Addended by: Francella Solian on: 08/06/2020 09:37 AM   Modules accepted: Orders

## 2020-08-06 NOTE — Patient Instructions (Signed)
Stop by the lab prior to leaving today. I will notify you of your results once received.   Continue to work on a healthy diet. Ensure you are consuming 64 ounces of water daily.  Start exercising. You should be getting 150 minutes of moderate intensity exercise weekly.  [facility] to schedule your mammogram and bone density scan.   It was a pleasure to see you today!   Preventive Care 66 Years and Older, Female Preventive care refers to lifestyle choices and visits with your health care provider that can promote health and wellness. This includes:  A yearly physical exam. This is also called an annual wellness visit.  Regular dental and eye exams.  Immunizations.  Screening for certain conditions.  Healthy lifestyle choices, such as: ? Eating a healthy diet. ? Getting regular exercise. ? Not using drugs or products that contain nicotine and tobacco. ? Limiting alcohol use. What can I expect for my preventive care visit? Physical exam Your health care provider will check your:  Height and weight. These may be used to calculate your BMI (body mass index). BMI is a measurement that tells if you are at a healthy weight.  Heart rate and blood pressure.  Body temperature.  Skin for abnormal spots. Counseling Your health care provider may ask you questions about your:  Past medical problems.  Family's medical history.  Alcohol, tobacco, and drug use.  Emotional well-being.  Home life and relationship well-being.  Sexual activity.  Diet, exercise, and sleep habits.  History of falls.  Memory and ability to understand (cognition).  Work and work Statistician.  Pregnancy and menstrual history.  Access to firearms. What immunizations do I need? Vaccines are usually given at various ages, according to a schedule. Your health care provider will recommend vaccines for you based on your age, medical history, and lifestyle or other factors, such as travel  or where you work.   What tests do I need? Blood tests  Lipid and cholesterol levels. These may be checked every 5 years, or more often depending on your overall health.  Hepatitis C test.  Hepatitis B test. Screening  Lung cancer screening. You may have this screening every year starting at age 66 if you have a 30-pack-year history of smoking and currently smoke or have quit within the past 15 years.  Colorectal cancer screening. ? All adults should have this screening starting at age 66 and continuing until age 80. ? Your health care provider may recommend screening at age 66 if you are at increased risk. ? You will have tests every 1-10 years, depending on your results and the type of screening test.  Diabetes screening. ? This is done by checking your blood sugar (glucose) after you have not eaten for a while (fasting). ? You may have this done every 1-3 years.  Mammogram. ? This may be done every 1-2 years. ? Talk with your health care provider about how often you should have regular mammograms.  Abdominal aortic aneurysm (AAA) screening. You may need this if you are a current or former smoker.  BRCA-related cancer screening. This may be done if you have a family history of breast, ovarian, tubal, or peritoneal cancers. Other tests  STD (sexually transmitted disease) testing, if you are at risk.  Bone density scan. This is done to screen for osteoporosis. You may have this done starting at age 66. Talk with your health care provider about your test results, treatment options, and if necessary,  the need for more tests. Follow these instructions at home: Eating and drinking  Eat a diet that includes fresh fruits and vegetables, whole grains, lean protein, and low-fat dairy products. Limit your intake of foods with high amounts of sugar, saturated fats, and salt.  Take vitamin and mineral supplements as recommended by your health care provider.  Do not drink alcohol if  your health care provider tells you not to drink.  If you drink alcohol: ? Limit how much you have to 0-1 drink a day. ? Be aware of how much alcohol is in your drink. In the [place] one drink equals one 12 oz bottle of beer (355 mL), one 5 oz glass of wine (148 mL), or one 1 oz glass of hard liquor (44 mL).   Lifestyle  Take daily care of your teeth and gums. Brush your teeth every morning and night with fluoride toothpaste. Floss one time each day.  Stay active. Exercise for at least 30 minutes 5 or more days each week.  Do not use any products that contain nicotine or tobacco, such as cigarettes, e-cigarettes, and chewing tobacco. If you need help quitting, ask your health care provider.  Do not use drugs.  If you are sexually active, practice safe sex. Use a condom or other form of protection in order to prevent STIs (sexually transmitted infections).  Talk with your health care provider about taking a low-dose aspirin or statin.  Find healthy ways to cope with stress, such as: ? Meditation, yoga, or listening to music. ? Journaling. ? Talking to a trusted person. ? Spending time with friends and family. Safety  Always wear your seat belt while driving or riding in a vehicle.  Do not drive: ? If you have been drinking alcohol. Do not ride with someone who has been drinking. ? When you are tired or distracted. ? While texting.  Wear a helmet and other protective equipment during sports activities.  If you have firearms in your house, make sure you follow all gun safety procedures. What's next?  Visit your health care provider once a year for an annual wellness visit.  Ask your health care provider how often you should have your eyes and teeth checked.  Stay up to date on all vaccines. This information is not intended to replace advice given to you by your health care provider. Make sure you discuss any questions you have with your health care provider. Document Revised:  05/21/2020 Document Reviewed: 05/25/2018 Elsevier Patient Education  2021 [employer].

## 2020-08-06 NOTE — Assessment & Plan Note (Addendum)
Recent allergy to Alpha Gal, also milk products. She is doing well by avoiding meat/pork/dairy products.  She has an Epi Pen at home.

## 2020-08-06 NOTE — Addendum Note (Signed)
Addended by: Francella Solian on: 08/06/2020 02:40 PM   Modules accepted: Orders

## 2020-08-06 NOTE — Assessment & Plan Note (Signed)
Following with cardiology, doing well on Praluent every 2 weeks. Continue same.   Repeat lipid panel pending.

## 2020-08-06 NOTE — Addendum Note (Signed)
Addended by: Pleas Koch on: 08/06/2020 09:31 AM   Modules accepted: Orders

## 2020-08-06 NOTE — Assessment & Plan Note (Signed)
Recent Alpha Gal testing positive. Also with allergy to milk products.  Recently evaluated by allergist who advised allergy injections for which she declined. She is doing well by avoiding meat/pork/dairy products.   No rashes since. Continue to monitor.

## 2020-08-06 NOTE — Assessment & Plan Note (Signed)
Doing well on citalopram 20 mg, she has reduced her dose to 10 mg a week ago and is doing well. She will update.

## 2020-08-06 NOTE — Progress Notes (Signed)
Subjective:    Patient ID: Nancy [name], female    DOB: [birth year], 65 y.o.   MRN: [id number]  HPI  This visit occurred during the SARS-CoV-2 public health emergency.  Safety protocols were in place, including screening questions prior to the visit, additional usage of staff PPE, and extensive cleaning of exam room while observing appropriate contact time as indicated for disinfecting solutions.   Nancy [name] is a 65 year old female who presents today for complete physical.  A few days ago she noticed some cloudy urine, also some discomfort after urination. She denies symptoms over the last 24 hours. She denies hematuria, pelvic pain.   Immunizations: -Tetanus: 2017 -Influenza: Completed this season  -Shingles: Never completed, declines  -Pneumonia: Never completed, due today. -Covid-19: Completed 2 doses   Diet: She is working on a healthy diet. Exercise: No regular exercise, is trying to exercise some.   Eye exam: Completed in 2021 Dental exam: Due  Mammogram: Follows with GYN Dexa: Follows with GYN Colonoscopy: Completed in 2012, declines and opts for Cologuard.  Hep C Screen: Negative  BP Readings from Last 3 Encounters:  08/06/20 122/74  07/29/20 122/78  07/10/20 130/80   Wt Readings from Last 3 Encounters:  08/06/20 149 lb (67.6 kg)  07/29/20 147 lb 9.6 oz (67 kg)  07/10/20 145 lb 12.8 oz (66.1 kg)      Review of Systems  Constitutional: Negative for unexpected weight change.  HENT: Negative for rhinorrhea.   Eyes: Negative for visual disturbance.  Respiratory: Negative for cough and shortness of breath.   Cardiovascular: Negative for chest pain.  Gastrointestinal: Negative for constipation and diarrhea.  Genitourinary: Negative for difficulty urinating.  Musculoskeletal: Negative for arthralgias and myalgias.  Skin: Negative for rash.  Allergic/Immunologic: Positive for environmental allergies and food allergies.  Neurological: Negative for dizziness,  numbness and headaches.  Psychiatric/Behavioral: The patient is not nervous/anxious.        Past Medical History:  Diagnosis Date  . Chronic neck pain 04/11/2018  . Eczema   . Essential hypertension 07/20/2019  . Fatigue   . GAD (generalized anxiety disorder) 06/26/2018  . Hyperlipidemia   . Lichen sclerosus   . Overweight (BMI 25.0-29.9) 01/09/2018  . Recurrent upper respiratory infection (URI)   . Routine general medical examination at a health care facility   . Urinary frequency 06/06/2019  . Urticaria   . Vaginal itching 06/06/2019     Social History   Socioeconomic History  . Marital status: Single    Spouse name: Not on file  . Number of children: 2  . Years of education: Not on file  . Highest education level: Not on file  Occupational History  . Not on file  Tobacco Use  . Smoking status: Never Smoker  . Smokeless tobacco: Never Used  Vaping Use  . Vaping Use: Never used  Substance and Sexual Activity  . Alcohol use: Yes    Alcohol/week: 0.0 standard drinks    Comment: occ  . Drug use: No  . Sexual activity: Not on file  Other Topics Concern  . Not on file  Social History Narrative   Divorced.  G3P2.   Regular exercise-yes   Social Determinants of Health   Financial Resource Strain: Not on file  Food Insecurity: Not on file  Transportation Needs: Not on file  Physical Activity: Not on file  Stress: Not on file  Social Connections: Not on file  Intimate Partner Violence: Not on file  Past Surgical History:  Procedure Laterality Date  . ANTERIOR CERVICAL DECOMP/DISCECTOMY FUSION    . PARTIAL HYSTERECTOMY    . TUBAL LIGATION      Family History  Problem Relation Age of Onset  . Hydrocephalus Mother   . Neuropathy Mother   . Diabetes Father   . Hypertension Father   . Thyroid cancer Cousin   . Thyroid disease Maternal Aunt     Allergies  Allergen Reactions  . Statins Other (See Comments)    Myalgias   . Zetia [Ezetimibe] Other (See  Comments)    Myalgias    Current Outpatient Medications on File Prior to Visit  Medication Sig Dispense Refill  . Alirocumab (PRALUENT) 75 MG/ML SOAJ Inject 1 pen into the skin every 14 (fourteen) days. 6 mL 3  . cholecalciferol (VITAMIN D3) 25 MCG (1000 UT) tablet Take 1,000 Units by mouth daily.    . citalopram (CELEXA) 20 MG tablet TAKE 1 TABLET (20 MG TOTAL) BY MOUTH DAILY. FOR ANXIETY. (Patient taking differently: Take 10 mg by mouth daily.) 90 tablet 1  . clobetasol cream (TEMOVATE) 1.32 % Apply 1 application topically as needed.    Marland Kitchen EPINEPHrine 0.3 mg/0.3 mL IJ SOAJ injection Inject 0.3 mg into the muscle as needed for anaphylaxis. 1 each 0   No current facility-administered medications on file prior to visit.    BP 122/74   Pulse 75   Temp 98.4 F (36.9 C) (Temporal)   Ht 5\' 3"  (1.6 m)   Wt 149 lb (67.6 kg)   SpO2 98%   BMI 26.39 kg/m    Objective:   Physical Exam Constitutional:      Appearance: She is well-nourished.  HENT:     Right Ear: Tympanic membrane and ear canal normal.     Left Ear: Tympanic membrane and ear canal normal.     Mouth/Throat:     Mouth: Oropharynx is clear and moist.  Eyes:     Extraocular Movements: EOM normal.     Pupils: Pupils are equal, round, and reactive to light.  Cardiovascular:     Rate and Rhythm: Normal rate and regular rhythm.  Pulmonary:     Effort: Pulmonary effort is normal.     Breath sounds: Normal breath sounds.  Abdominal:     General: Bowel sounds are normal.     Palpations: Abdomen is soft.     Tenderness: There is no abdominal tenderness.  Musculoskeletal:        General: Normal range of motion.     Cervical back: Neck supple.  Skin:    General: Skin is warm and dry.  Neurological:     Mental Status: She is alert and oriented to person, place, and time.     Cranial Nerves: No cranial nerve deficit.     Deep Tendon Reflexes:     Reflex Scores:      Patellar reflexes are 2+ on the right side and 2+ on the  left side. Psychiatric:        Mood and Affect: Mood and affect and mood normal.            Assessment & Plan:

## 2020-08-07 DIAGNOSIS — D225 Melanocytic nevi of trunk: Secondary | ICD-10-CM | POA: Diagnosis not present

## 2020-08-07 DIAGNOSIS — L84 Corns and callosities: Secondary | ICD-10-CM | POA: Diagnosis not present

## 2020-08-07 DIAGNOSIS — L814 Other melanin hyperpigmentation: Secondary | ICD-10-CM | POA: Diagnosis not present

## 2020-08-07 DIAGNOSIS — L821 Other seborrheic keratosis: Secondary | ICD-10-CM | POA: Diagnosis not present

## 2020-08-07 DIAGNOSIS — D485 Neoplasm of uncertain behavior of skin: Secondary | ICD-10-CM | POA: Diagnosis not present

## 2020-08-07 DIAGNOSIS — L820 Inflamed seborrheic keratosis: Secondary | ICD-10-CM | POA: Diagnosis not present

## 2020-08-08 ENCOUNTER — Other Ambulatory Visit: Payer: Self-pay | Admitting: Primary Care

## 2020-08-08 DIAGNOSIS — N3000 Acute cystitis without hematuria: Secondary | ICD-10-CM

## 2020-08-08 LAB — URINE CULTURE
MICRO NUMBER:: 11569752
SPECIMEN QUALITY:: ADEQUATE

## 2020-08-08 LAB — HIV ANTIBODY (ROUTINE TESTING W REFLEX): HIV 1&2 Ab, 4th Generation: NONREACTIVE

## 2020-08-08 MED ORDER — NITROFURANTOIN MONOHYD MACRO 100 MG PO CAPS: 100.0000 mg | ORAL_CAPSULE | Freq: Two times a day (BID) | ORAL | 0 refills | Status: DC

## 2020-08-11 ENCOUNTER — Ambulatory Visit: Payer: Self-pay

## 2020-08-12 DIAGNOSIS — N3000 Acute cystitis without hematuria: Secondary | ICD-10-CM

## 2020-08-20 ENCOUNTER — Other Ambulatory Visit (INDEPENDENT_AMBULATORY_CARE_PROVIDER_SITE_OTHER): Payer: PPO

## 2020-08-20 ENCOUNTER — Other Ambulatory Visit: Payer: Self-pay

## 2020-08-20 DIAGNOSIS — N3000 Acute cystitis without hematuria: Secondary | ICD-10-CM

## 2020-08-20 LAB — POC URINALSYSI DIPSTICK (AUTOMATED)
Bilirubin, UA: NEGATIVE
Blood, UA: NEGATIVE
Glucose, UA: NEGATIVE
Ketones, UA: NEGATIVE
Nitrite, UA: NEGATIVE
Protein, UA: NEGATIVE
Spec Grav, UA: 1.015 (ref 1.010–1.025)
Urobilinogen, UA: 0.2 E.U./dL
pH, UA: 6.5 (ref 5.0–8.0)

## 2020-08-21 LAB — URINE CULTURE
MICRO NUMBER:: 11626919
Result:: NO GROWTH
SPECIMEN QUALITY:: ADEQUATE

## 2020-08-26 ENCOUNTER — Ambulatory Visit: Payer: PPO | Admitting: Allergy & Immunology

## 2020-09-03 DIAGNOSIS — Z1211 Encounter for screening for malignant neoplasm of colon: Secondary | ICD-10-CM | POA: Diagnosis not present

## 2020-09-08 LAB — COLOGUARD: Cologuard: POSITIVE — AB

## 2020-09-10 ENCOUNTER — Telehealth: Payer: Self-pay

## 2020-09-10 NOTE — Telephone Encounter (Signed)
Santiago Glad is calling in from [employer] she is advising that there is an abnormal result.

## 2020-09-11 ENCOUNTER — Other Ambulatory Visit: Payer: Self-pay

## 2020-09-11 DIAGNOSIS — Z1211 Encounter for screening for malignant neoplasm of colon: Secondary | ICD-10-CM

## 2020-09-11 NOTE — Telephone Encounter (Signed)
Positive cologuard has been received. Per Nancy [name] needs to make referral to GI for colonoscopy. Have called patient l/m to  give results and find out where she would like to be sent.

## 2020-09-11 NOTE — Telephone Encounter (Signed)
Patient returned call referral started no further questions.

## 2020-09-12 DIAGNOSIS — R195 Other fecal abnormalities: Secondary | ICD-10-CM

## 2020-09-13 NOTE — Telephone Encounter (Signed)
Nancy [name], any way we could have her seen sooner by GI for a positive Cologuard. This really should trump screening colonoscopies.

## 2020-09-16 NOTE — Telephone Encounter (Signed)
I have reordered GI referral. She needs to wait until Anda Kraft returns to discuss B12 recheck.

## 2020-09-16 NOTE — Telephone Encounter (Signed)
Spoke with patient. She is ok with going to [facility]. I advised patient once referral is placed it will be sent to [facility] office and they will contact her directly to schedule.   Patient also was asking about re checking her B12 levels. Patient states she spoke with Anda Kraft about this and she told patient we may re check levels sooner than waiting 3 months from her last draw to see if patient needs to start b12 injections. Please let patient know if she can go ahead and come back for labs. She has been taking b12 supplements. Advised patient I would see if Rollene Fare can answer that or if it needs to wait for Anda Kraft. Please let patient know through mychart. Thank you

## 2020-09-16 NOTE — Telephone Encounter (Signed)
I called [facility] office and asked about this. June is the first available appointment they had. They do schedule patient's sooner that had cologuard positive result but June is the first available for that at this time. I called [facility] office to ask how far out they were scheduling but had to leave a message for someone to call me back to discuss.

## 2020-09-16 NOTE — Telephone Encounter (Signed)
Referral sent over to [facility] office to schedule. Sent mychart message to the patient letting her know we will wait for Anda Kraft to review her request to recheck B12.

## 2020-09-16 NOTE — Telephone Encounter (Signed)
Received a call back From [facility] and they said they usually can get patient in within 2 weeks for consultation and then procedure. Just need a new referral if ok to proceed. Thank you

## 2020-09-18 NOTE — Telephone Encounter (Signed)
I have sent a message to [facility] asking to reach out to the patient today if possible. Per referral WQ their office is working on the referral.

## 2020-09-18 NOTE — Telephone Encounter (Signed)
Patient has been contacted by [facility] and scheduled to be seen to discuss colonoscopy.

## 2020-09-23 ENCOUNTER — Telehealth (INDEPENDENT_AMBULATORY_CARE_PROVIDER_SITE_OTHER): Payer: Self-pay | Admitting: Gastroenterology

## 2020-09-23 ENCOUNTER — Other Ambulatory Visit: Payer: Self-pay

## 2020-09-23 DIAGNOSIS — R195 Other fecal abnormalities: Secondary | ICD-10-CM

## 2020-09-23 MED ORDER — NA SULFATE-K SULFATE-MG SULF 17.5-3.13-1.6 GM/177ML PO SOLN: 1.0000 | Freq: Once | ORAL | 0 refills | Status: AC

## 2020-09-23 NOTE — Progress Notes (Signed)
Gastroenterology Pre-Procedure Review  Request Date: 04/25 Requesting Physician: Dr. Ronelle Nigh  PATIENT REVIEW QUESTIONS: The patient responded to the following health history questions as indicated:    1. Are you having any GI issues? no 2. Do you have a personal history of Polyps? no 3. Do you have a family history of Colon Cancer or Polyps? no 4. Diabetes Mellitus? no 5. Joint replacements in the past 12 months?no 6. Major health problems in the past 3 months?no 7. Any artificial heart valves, MVP, or defibrillator?no    MEDICATIONS & ALLERGIES:    Patient reports the following regarding taking any anticoagulation/antiplatelet therapy:   Plavix, Coumadin, Eliquis, Xarelto, Lovenox, Pradaxa, Brilinta, or Effient? no Aspirin? no  Patient confirms/reports the following medications:  Current Outpatient Medications  Medication Sig Dispense Refill  . Alirocumab (PRALUENT) 75 MG/ML SOAJ Inject 1 pen into the skin every 14 (fourteen) days. 6 mL 3  . citalopram (CELEXA) 20 MG tablet TAKE 1 TABLET (20 MG TOTAL) BY MOUTH DAILY. FOR ANXIETY. (Patient taking differently: Take 10 mg by mouth daily.) 90 tablet 1  . clobetasol cream (TEMOVATE) 2.97 % Apply 1 application topically as needed.    . Cyanocobalamin (B-12) 1000 MCG CAPS     . diphenhydrAMINE HCl ([facility] ALLERGY PO) Take by mouth.    . EPINEPHrine 0.3 mg/0.3 mL IJ SOAJ injection Inject 0.3 mg into the muscle as needed for anaphylaxis. 1 each 0   No current facility-administered medications for this visit.    Patient confirms/reports the following allergies:  Allergies  Allergen Reactions  . Macrobid [Nitrofurantoin] Other (See Comments)    Hypoglycemia  . Statins Other (See Comments)    Myalgias   . Zetia [Ezetimibe] Other (See Comments)    Myalgias    No orders of the defined types were placed in this encounter.   AUTHORIZATION INFORMATION Primary Insurance: 1D#: Group #:  Secondary Insurance: 1D#: Group  #:  SCHEDULE INFORMATION: Date: 10/06/20 Time: Location:[facility]

## 2020-09-24 DIAGNOSIS — R7303 Prediabetes: Secondary | ICD-10-CM

## 2020-09-24 DIAGNOSIS — F411 Generalized anxiety disorder: Secondary | ICD-10-CM

## 2020-09-24 DIAGNOSIS — E538 Deficiency of other specified B group vitamins: Secondary | ICD-10-CM

## 2020-09-29 ENCOUNTER — Other Ambulatory Visit: Payer: Self-pay

## 2020-09-29 ENCOUNTER — Other Ambulatory Visit (INDEPENDENT_AMBULATORY_CARE_PROVIDER_SITE_OTHER): Payer: PPO

## 2020-09-29 DIAGNOSIS — E538 Deficiency of other specified B group vitamins: Secondary | ICD-10-CM | POA: Diagnosis not present

## 2020-09-29 DIAGNOSIS — R7303 Prediabetes: Secondary | ICD-10-CM

## 2020-09-29 LAB — VITAMIN B12: Vitamin B-12: 941 pg/mL — ABNORMAL HIGH (ref 211–911)

## 2020-09-29 LAB — HEMOGLOBIN A1C: Hgb A1c MFr Bld: 5.8 % (ref 4.6–6.5)

## 2020-10-02 DIAGNOSIS — Z6826 Body mass index (BMI) 26.0-26.9, adult: Secondary | ICD-10-CM | POA: Diagnosis not present

## 2020-10-02 DIAGNOSIS — L900 Lichen sclerosus et atrophicus: Secondary | ICD-10-CM | POA: Diagnosis not present

## 2020-10-02 DIAGNOSIS — Z1231 Encounter for screening mammogram for malignant neoplasm of breast: Secondary | ICD-10-CM | POA: Diagnosis not present

## 2020-10-02 DIAGNOSIS — Z01419 Encounter for gynecological examination (general) (routine) without abnormal findings: Secondary | ICD-10-CM | POA: Diagnosis not present

## 2020-10-02 LAB — HM MAMMOGRAPHY

## 2020-10-03 ENCOUNTER — Other Ambulatory Visit: Payer: Self-pay | Admitting: Obstetrics & Gynecology

## 2020-10-03 DIAGNOSIS — R928 Other abnormal and inconclusive findings on diagnostic imaging of breast: Secondary | ICD-10-CM

## 2020-10-04 DIAGNOSIS — N3001 Acute cystitis with hematuria: Secondary | ICD-10-CM | POA: Diagnosis not present

## 2020-10-06 ENCOUNTER — Ambulatory Visit
Admission: RE | Admit: 2020-10-06 | Discharge: 2020-10-06 | Disposition: A | Discharge status: Home / Self Care | Payer: PPO | Attending: Gastroenterology | Admitting: Gastroenterology

## 2020-10-06 ENCOUNTER — Encounter: Payer: Self-pay | Admitting: Primary Care

## 2020-10-06 ENCOUNTER — Encounter: Payer: Self-pay | Admitting: Gastroenterology

## 2020-10-06 ENCOUNTER — Ambulatory Visit: Payer: PPO | Admitting: Anesthesiology

## 2020-10-06 ENCOUNTER — Encounter
Admission: RE | Disposition: A | Discharge status: Home / Self Care | Payer: Self-pay | Source: Home / Self Care | Attending: Gastroenterology

## 2020-10-06 ENCOUNTER — Other Ambulatory Visit: Payer: Self-pay

## 2020-10-06 DIAGNOSIS — Z1211 Encounter for screening for malignant neoplasm of colon: Secondary | ICD-10-CM | POA: Diagnosis not present

## 2020-10-06 DIAGNOSIS — R195 Other fecal abnormalities: Secondary | ICD-10-CM

## 2020-10-06 DIAGNOSIS — Z8249 Family history of ischemic heart disease and other diseases of the circulatory system: Secondary | ICD-10-CM | POA: Diagnosis not present

## 2020-10-06 DIAGNOSIS — Z808 Family history of malignant neoplasm of other organs or systems: Secondary | ICD-10-CM | POA: Insufficient documentation

## 2020-10-06 DIAGNOSIS — Z888 Allergy status to other drugs, medicaments and biological substances status: Secondary | ICD-10-CM | POA: Diagnosis not present

## 2020-10-06 DIAGNOSIS — I10 Essential (primary) hypertension: Secondary | ICD-10-CM | POA: Insufficient documentation

## 2020-10-06 DIAGNOSIS — K635 Polyp of colon: Secondary | ICD-10-CM | POA: Diagnosis not present

## 2020-10-06 DIAGNOSIS — Z881 Allergy status to other antibiotic agents status: Secondary | ICD-10-CM | POA: Insufficient documentation

## 2020-10-06 DIAGNOSIS — D125 Benign neoplasm of sigmoid colon: Secondary | ICD-10-CM | POA: Insufficient documentation

## 2020-10-06 DIAGNOSIS — D123 Benign neoplasm of transverse colon: Secondary | ICD-10-CM | POA: Insufficient documentation

## 2020-10-06 DIAGNOSIS — Z79899 Other long term (current) drug therapy: Secondary | ICD-10-CM | POA: Diagnosis not present

## 2020-10-06 DIAGNOSIS — K5730 Diverticulosis of large intestine without perforation or abscess without bleeding: Secondary | ICD-10-CM | POA: Insufficient documentation

## 2020-10-06 DIAGNOSIS — K5790 Diverticulosis of intestine, part unspecified, without perforation or abscess without bleeding: Secondary | ICD-10-CM

## 2020-10-06 DIAGNOSIS — B356 Tinea cruris: Secondary | ICD-10-CM | POA: Insufficient documentation

## 2020-10-06 DIAGNOSIS — Z833 Family history of diabetes mellitus: Secondary | ICD-10-CM | POA: Diagnosis not present

## 2020-10-06 DIAGNOSIS — Z8349 Family history of other endocrine, nutritional and metabolic diseases: Secondary | ICD-10-CM | POA: Diagnosis not present

## 2020-10-06 HISTORY — PX: COLONOSCOPY WITH PROPOFOL: SHX5780

## 2020-10-06 SURGERY — COLONOSCOPY WITH PROPOFOL
Anesthesia: General

## 2020-10-06 MED ORDER — SODIUM CHLORIDE 0.9 % IV SOLN: INTRAVENOUS | Status: DC

## 2020-10-06 MED ORDER — PROPOFOL 500 MG/50ML IV EMUL
INTRAVENOUS | Status: DC | PRN
  Administered 2020-10-06: 120 ug/kg/min via INTRAVENOUS

## 2020-10-06 MED ORDER — PROPOFOL 500 MG/50ML IV EMUL
INTRAVENOUS | Status: AC
  Filled 2020-10-06: qty 50

## 2020-10-06 MED ORDER — PROPOFOL 10 MG/ML IV BOLUS
INTRAVENOUS | Status: AC
  Filled 2020-10-06: qty 20

## 2020-10-06 NOTE — Op Note (Signed)
[facility] Gastroenterology Patient Name: Nancy [name] Procedure Date: 10/06/2020 8:12 AM MRN: [id number] Account #: [id number] Date of Birth: [birth year] Admit Type: Outpatient Age: 65 Room: [facility] ENDO ROOM 2 Gender: Female Note Status: Finalized Procedure:             Colonoscopy Indications:           Positive Cologuard test Providers:             Aziyah Provencal B. Bonna Gains MD, MD Medicines:             Monitored Anesthesia Care Complications:         No immediate complications. Procedure:             Pre-Anesthesia Assessment:                        - ASA Grade Assessment: II - A patient with mild                         systemic disease.                        - Prior to the procedure, a History and Physical was                         performed, and patient medications, allergies and                         sensitivities were reviewed. The patient's tolerance                         of previous anesthesia was reviewed.                        - The risks and benefits of the procedure and the                         sedation options and risks were discussed with the                         patient. All questions were answered and informed                         consent was obtained.                        - Patient identification and proposed procedure were                         verified prior to the procedure by the physician, the                         nurse, the anesthesiologist, the anesthetist and the                         technician. The procedure was verified in the                         procedure room.  After obtaining informed consent, the colonoscope was                         passed under direct vision. Throughout the procedure,                         the patient's blood pressure, pulse, and oxygen                         saturations were monitored continuously. The                         Colonoscope was introduced through  the anus and                         advanced to the the cecum, identified by appendiceal                         orifice and ileocecal valve. The colonoscopy was                         performed with ease. The patient tolerated the                         procedure well. The quality of the bowel preparation                         was fair. Water and suctioning used to clean the colon                         well. Findings:      The perianal exam findings include a perianal fungal rash.      A 15 mm polyp was found in the transverse colon. The polyp was flat.       Preparations were made for mucosal resection. NBI was done to mark the       borders of the lesion. Saline was injected to raise the lesion.       Piecemeal mucosal resection using a snare was performed. Resection and       retrieval were complete.      A 4 mm polyp was found in the sigmoid colon. The polyp was sessile. The       polyp was removed with a cold snare. Resection and retrieval were       complete.      A single diverticulum was found in the ascending colon.      Multiple diverticula were found in the sigmoid colon.      The exam was otherwise without abnormality.      The retroflexed view of the distal rectum and anal verge was normal and       showed no anal or rectal abnormalities. Impression:            - Perianal fungal rash found on perianal exam.                        - One 15 mm polyp in the transverse colon, removed                         with mucosal  resection. Resected and retrieved.                        - One 4 mm polyp in the sigmoid colon, removed with a                         cold snare. Resected and retrieved.                        - Diverticulosis in the sigmoid colon.                        - The examination was otherwise normal.                        - The distal rectum and anal verge are normal on                         retroflexion view.                        - Mucosal resection  was performed. Resection and                         retrieval were complete. Recommendation:        - Keep perianal area dry and clean well with soap and                         water daily. If rash persists after 7-10 days use                         topical antifungal cream as necessary after discussing                         with PCP.                        - Discharge patient to home (with escort).                        - Advance diet as tolerated.                        - Continue present medications.                        - Await pathology results.                        - Repeat colonoscopy date to be determined after                         pending pathology results are reviewed.                        - The findings and recommendations were discussed with                         the patient.                        -  The findings and recommendations were discussed with                         the patient's family.                        - Return to primary care physician as previously                         scheduled.                        - High fiber diet. Procedure Code(s):     --- Professional ---                        [phone], Colonoscopy, flexible; with endoscopic mucosal                         resection                        [id number], 59, Colonoscopy, flexible; with removal of                         tumor(s), polyp(s), or other lesion(s) by snare                         technique Diagnosis Code(s):     --- Professional ---                        K63.5, Polyp of colon                        R19.5, Other fecal abnormalities CPT copyright 2019 American Medical Association. All rights reserved. The codes documented in this report are preliminary and upon coder review may  be revised to meet current compliance requirements.  Vonda Antigua, MD Margretta Sidle B. Bonna Gains MD, MD 10/06/2020 9:06:14 AM This report has been signed electronically. Number of Addenda: 0 Note  Initiated On: 10/06/2020 8:12 AM Scope Withdrawal Time: 0 hours 35 minutes 18 seconds  Total Procedure Duration: 0 hours 39 minutes 21 seconds  Estimated Blood Loss:  Estimated blood loss: none.      [facility]

## 2020-10-06 NOTE — Transfer of Care (Signed)
Immediate Anesthesia Transfer of Care Note  Patient: Nancy [name]  Procedure(s) Performed: COLONOSCOPY WITH PROPOFOL (N/A )  Patient Location: PACU  Anesthesia Type:General  Level of Consciousness: awake and sedated  Airway & Oxygen Therapy: Patient Spontanous Breathing and Patient connected to nasal cannula oxygen  Post-op Assessment: Report given to RN and Post -op Vital signs reviewed and stable  Post vital signs: Reviewed and stable  Last Vitals:  Vitals Value Taken Time  BP    Temp    Pulse    Resp    SpO2      Last Pain:  Vitals:   10/06/20 0703  TempSrc: Temporal  PainSc: 0-No pain         Complications: No complications documented.

## 2020-10-06 NOTE — Anesthesia Postprocedure Evaluation (Signed)
Anesthesia Post Note  Patient: Nancy [name]  Procedure(s) Performed: COLONOSCOPY WITH PROPOFOL (N/A )  Patient location during evaluation: Endoscopy Anesthesia Type: General Level of consciousness: awake and alert and oriented Pain management: pain level controlled Vital Signs Assessment: post-procedure vital signs reviewed and stable Respiratory status: spontaneous breathing Cardiovascular status: blood pressure returned to baseline Anesthetic complications: no   No complications documented.   Last Vitals:  Vitals:   10/06/20 0900 10/06/20 0910  BP: 126/64 109/72  Pulse:    Resp:    Temp: (!) 36.1 C   SpO2: 100%     Last Pain:  Vitals:   10/06/20 0920  TempSrc:   PainSc: 0-No pain                 Kalyna Paolella

## 2020-10-06 NOTE — Anesthesia Procedure Notes (Signed)
Performed by: Cook-Martin, Tiersa Dayley Pre-anesthesia Checklist: Patient identified, Emergency Drugs available, Suction available, Patient being monitored and Timeout performed Patient Re-evaluated:Patient Re-evaluated prior to induction Oxygen Delivery Method: Nasal cannula Preoxygenation: Pre-oxygenation with 100% oxygen Induction Type: IV induction Placement Confirmation: positive ETCO2 and CO2 detector       

## 2020-10-06 NOTE — Anesthesia Preprocedure Evaluation (Signed)
Anesthesia Evaluation  Patient identified by MRN, date of birth, ID band Patient awake    Reviewed: Allergy & Precautions, NPO status , Patient's Chart, lab work & pertinent test results  Airway Mallampati: III       Dental   Pulmonary    Pulmonary exam normal        Cardiovascular hypertension, Normal cardiovascular exam     Neuro/Psych PSYCHIATRIC DISORDERS Anxiety negative neurological ROS     GI/Hepatic Neg liver ROS,   Endo/Other    Renal/GU negative Renal ROS  negative genitourinary   Musculoskeletal  (+) Arthritis , Osteoarthritis,    Abdominal Normal abdominal exam  (+)   Peds negative pediatric ROS (+)  Hematology negative hematology ROS (+)   Anesthesia Other Findings   Reproductive/Obstetrics                             Anesthesia Physical Anesthesia Plan  ASA: II  Anesthesia Plan: General   Post-op Pain Management:    Induction: Intravenous  PONV Risk Score and Plan: Propofol infusion  Airway Management Planned:   Additional Equipment:   Intra-op Plan:   Post-operative Plan:   Informed Consent: I have reviewed the patients History and Physical, chart, labs and discussed the procedure including the risks, benefits and alternatives for the proposed anesthesia with the patient or authorized representative who has indicated his/her understanding and acceptance.     Dental advisory given  Plan Discussed with: CRNA and Surgeon  Anesthesia Plan Comments:         Anesthesia Quick Evaluation

## 2020-10-06 NOTE — H&P (Signed)
Vonda Antigua, MD [address] [place], [place], [place], [address] [address], [place], [place], [place], [address] Phone: [phone]  Fax: [phone]  Primary Care Physician:  Pleas Koch, NP   Pre-Procedure History & Physical: HPI:  Nancy [name] is a 65 y.o. female is here for a colonoscopy.   Past Medical History:  Diagnosis Date  . Chronic neck pain 04/11/2018  . Eczema   . Essential hypertension 07/20/2019  . Fatigue   . GAD (generalized anxiety disorder) 06/26/2018  . Hyperlipidemia   . Lichen sclerosus   . Overweight (BMI 25.0-29.9) 01/09/2018  . Recurrent upper respiratory infection (URI)   . Routine general medical examination at a health care facility   . Urinary frequency 06/06/2019  . Urticaria   . Vaginal itching 06/06/2019    Past Surgical History:  Procedure Laterality Date  . ANTERIOR CERVICAL DECOMP/DISCECTOMY FUSION    . PARTIAL HYSTERECTOMY    . TUBAL LIGATION      Prior to Admission medications   Medication Sig Start Date End Date Taking? Authorizing Provider  Alirocumab (PRALUENT) 75 MG/ML SOAJ Inject 1 pen into the skin every 14 (fourteen) days. 04/24/20  Yes [name], Wonda Cheng, MD  Cyanocobalamin (B-12) 1000 MCG CAPS  08/08/20  Yes [provider]  EPINEPHrine 0.3 mg/0.3 mL IJ SOAJ injection Inject 0.3 mg into the muscle as needed for anaphylaxis. 05/26/20  Yes Pleas Koch, NP  citalopram (CELEXA) 20 MG tablet TAKE 1 TABLET (20 MG TOTAL) BY MOUTH DAILY. FOR ANXIETY. Patient taking differently: Take 10 mg by mouth daily. 07/16/20   Pleas Koch, NP  clobetasol cream (TEMOVATE) 7.98 % Apply 1 application topically as needed. 07/18/19   [provider]  diphenhydrAMINE HCl ([facility] ALLERGY PO) Take by mouth.    [provider]    Allergies as of 09/23/2020 - Review Complete 09/23/2020  Allergen Reaction Noted  . Macrobid [nitrofurantoin] Other (See Comments) 08/13/2020  . Statins Other (See  Comments) 01/09/2018  . Zetia [ezetimibe] Other (See Comments) 11/21/2019    Family History  Problem Relation Age of Onset  . Hydrocephalus Mother   . Neuropathy Mother   . Diabetes Father   . Hypertension Father   . Thyroid cancer Cousin   . Thyroid disease Maternal Aunt     Social History   Socioeconomic History  . Marital status: Single    Spouse name: Not on file  . Number of children: 2  . Years of education: Not on file  . Highest education level: Not on file  Occupational History  . Not on file  Tobacco Use  . Smoking status: Never Smoker  . Smokeless tobacco: Never Used  Vaping Use  . Vaping Use: Never used  Substance and Sexual Activity  . Alcohol use: Yes    Alcohol/week: 0.0 standard drinks    Comment: occ  . Drug use: No  . Sexual activity: Not on file  Other Topics Concern  . Not on file  Social History Narrative   Divorced.  G3P2.   Regular exercise-yes   Social Determinants of Health   Financial Resource Strain: Not on file  Food Insecurity: Not on file  Transportation Needs: Not on file  Physical Activity: Not on file  Stress: Not on file  Social Connections: Not on file  Intimate Partner Violence: Not on file    Review of Systems: See HPI, otherwise negative ROS  Physical Exam: BP (!) 174/89   Pulse (!) 106  Temp (!) 97.3 F (36.3 C) (Temporal)   Resp 18   Ht 5\' 2"  (1.575 m)   Wt 67.1 kg   SpO2 100%   BMI 27.07 kg/m  General:   Alert,  pleasant and cooperative in NAD Head:  Normocephalic and atraumatic. Neck:  Supple; no masses or thyromegaly. Lungs:  Clear throughout to auscultation, normal respiratory effort.    Heart:  +S1, +S2, Regular rate and rhythm, No edema. Abdomen:  Soft, nontender and nondistended. Normal bowel sounds, without guarding, and without rebound.   Neurologic:  Alert and  oriented x4;  grossly normal neurologically.  Impression/Plan: Nancy [name] is here for a colonoscopy to be performed for positive  cologuard  Risks, benefits, limitations, and alternatives regarding  colonoscopy have been reviewed with the patient.  Questions have been answered.  All parties agreeable.   Virgel Manifold, MD  10/06/2020, 8:08 AM

## 2020-10-07 LAB — SURGICAL PATHOLOGY

## 2020-10-08 ENCOUNTER — Encounter: Payer: Self-pay | Admitting: Gastroenterology

## 2020-10-13 DIAGNOSIS — L570 Actinic keratosis: Secondary | ICD-10-CM | POA: Diagnosis not present

## 2020-10-13 DIAGNOSIS — D485 Neoplasm of uncertain behavior of skin: Secondary | ICD-10-CM | POA: Diagnosis not present

## 2020-10-20 ENCOUNTER — Ambulatory Visit
Admission: RE | Admit: 2020-10-20 | Discharge: 2020-10-20 | Disposition: A | Discharge status: Home / Self Care | Payer: PPO | Source: Ambulatory Visit | Attending: Obstetrics & Gynecology | Admitting: Obstetrics & Gynecology

## 2020-10-20 ENCOUNTER — Other Ambulatory Visit: Payer: Self-pay

## 2020-10-20 ENCOUNTER — Other Ambulatory Visit: Payer: Self-pay | Admitting: Obstetrics & Gynecology

## 2020-10-20 DIAGNOSIS — R928 Other abnormal and inconclusive findings on diagnostic imaging of breast: Secondary | ICD-10-CM

## 2020-10-21 ENCOUNTER — Ambulatory Visit
Admission: RE | Admit: 2020-10-21 | Discharge: 2020-10-21 | Disposition: A | Discharge status: Home / Self Care | Payer: PPO | Source: Ambulatory Visit | Attending: Primary Care | Admitting: Primary Care

## 2020-10-21 DIAGNOSIS — Z780 Asymptomatic menopausal state: Secondary | ICD-10-CM | POA: Diagnosis not present

## 2020-10-21 DIAGNOSIS — E2839 Other primary ovarian failure: Secondary | ICD-10-CM

## 2020-10-23 ENCOUNTER — Other Ambulatory Visit: Payer: Self-pay | Admitting: Obstetrics & Gynecology

## 2020-10-23 ENCOUNTER — Ambulatory Visit
Admission: RE | Admit: 2020-10-23 | Discharge: 2020-10-23 | Disposition: A | Discharge status: Home / Self Care | Payer: PPO | Source: Ambulatory Visit | Attending: Obstetrics & Gynecology | Admitting: Obstetrics & Gynecology

## 2020-10-23 ENCOUNTER — Other Ambulatory Visit: Payer: Self-pay

## 2020-10-23 DIAGNOSIS — R928 Other abnormal and inconclusive findings on diagnostic imaging of breast: Secondary | ICD-10-CM

## 2020-10-23 DIAGNOSIS — C50919 Malignant neoplasm of unspecified site of unspecified female breast: Secondary | ICD-10-CM

## 2020-10-23 DIAGNOSIS — D0512 Intraductal carcinoma in situ of left breast: Secondary | ICD-10-CM | POA: Diagnosis not present

## 2020-10-23 HISTORY — DX: Malignant neoplasm of unspecified site of unspecified female breast: C50.919

## 2020-10-23 MED ORDER — BUSPIRONE HCL 5 MG PO TABS
5.0000 mg | ORAL_TABLET | Freq: Two times a day (BID) | ORAL | 0 refills | Status: DC
Start: 2020-10-23 — End: 2020-11-15

## 2020-10-24 ENCOUNTER — Other Ambulatory Visit: Payer: PPO

## 2020-10-27 ENCOUNTER — Encounter: Payer: Self-pay | Admitting: Primary Care

## 2020-10-28 ENCOUNTER — Telehealth: Payer: Self-pay | Admitting: Primary Care

## 2020-10-28 NOTE — Telephone Encounter (Signed)
Nancy [name],  This patient had a question regarding taking calcium and vitamin D along with her alpha gal allergy.  The supplements that she is finding over-the-counter contain gelatin.  Can you contact her regarding this?  See her MyChart message to me dated 10/27/2020.  Thanks! Allie Bossier, NP-C

## 2020-10-31 NOTE — Telephone Encounter (Signed)
Spoke with patient and recommended Pharmvista gelatin free vitamin D3 and Calcium product online. She also reports concern about Praluent - the PharmD with cardiology office has addressed this by [facility] message. Messaged relayed. She appreciated the call.  Debbora Dus, PharmD Clinical Pharmacist [facility] [phone]

## 2020-11-03 ENCOUNTER — Ambulatory Visit: Payer: Self-pay | Admitting: Surgery

## 2020-11-03 DIAGNOSIS — D0512 Intraductal carcinoma in situ of left breast: Secondary | ICD-10-CM | POA: Diagnosis not present

## 2020-11-03 NOTE — H&P (Signed)
Nancy [name] Appointment: 11/03/2020 2:20 PM Location: [facility] Patient #: [id number] DOB: Apr 10, 1956 Divorced / Language: Cleophus Molt / Race: White Female  History of Present Illness Marcello Moores A. Devera Englander MD; 11/03/2020 3:51 PM) Patient words: Patient sent request of the [facility] [place] due to an abnormal mammogram. She was noted to have an area in the left upper quadrant adjacent to the nipple complexes showed density that was poorly defined by mammogram. There are also calcifications. Core biopsy was done which showed residual scar with intermediate grade DCIS. The total diameter of the area was 1 cm.        Patient returns after screening study for evaluation of possible LEFT asymmetry with calcifications.  EXAM: DIGITAL DIAGNOSTIC UNILATERAL LEFT MAMMOGRAM WITH TOMOSYNTHESIS AND CAD; ULTRASOUND LEFT BREAST LIMITED  TECHNIQUE: Left digital diagnostic mammography and breast tomosynthesis was performed. The images were evaluated with computer-aided detection.; Targeted ultrasound examination of the left breast was performed  COMPARISON: Previous exam(s).  ACR Breast Density Category c: The breast tissue is heterogeneously dense, which may obscure small masses.  FINDINGS: Additional 2-D and 3-D images are performed. These views confirm presence of asymmetry with mild distortion and coarse calcifications in the UPPER INNER QUADRANT of the LEFT breast. The area is estimated to measure 1.0 x 0.9 x 1.4 centimeters.  On physical exam, I palpate no abnormality in the UPPER retroareolar region of the RIGHT breast.  Targeted ultrasound is performed, showing a group of microcysts in the 11 o'clock location of the LEFT breast 1 centimeter from the nipple associated with calcifications and possibly representing the abnormality seen mammographically. Sonographic correlate for the distortion is not well seen.  Evaluation of the LEFT axilla is negative for  adenopathy.  IMPRESSION: Persistent asymmetry with mild distortion in the anterior portion of the LEFT breast, in the UPPER INNER QUADRANT retroareolar region without sonographic correlate.  No LEFT axillary adenopathy.  RECOMMENDATION: Recommend stereotactic guided core biopsy of LEFT breast.  I have discussed the findings and recommendations with the patient. If applicable, a reminder letter will be sent to the patient regarding the next appointment.  BI-RADS CATEGORY 4: Suspicious.   Electronically Signed By: Nolon Nations M.D. 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 Prognostic markers (ER/PR) are pending and will be reported in an addendum. Dr. Saralyn Pilar.  The patient is a 65 year old female.   Past Surgical History Jess Barters, [place]; 11/03/2020 2:20 PM) Breast Biopsy Bilateral. Cesarean Section - Multiple Colon Polyp Removal - Colonoscopy Hysterectomy (not due to cancer) - Partial Oral Surgery Spinal Surgery - Neck  Diagnostic Studies History Jess Barters, CMA; 11/03/2020 2:20 PM) Colonoscopy within last year Mammogram within last year Pap Smear >5  years ago  Allergies Jess Barters, CMA; 11/03/2020 2:21 PM) Statins Depletion *DIETARY PRODUCTS/DIETARY MANAGEMENT PRODUCTS* Chest pain, Cough. Mobic *ANALGESICS - ANTI-INFLAMMATORY* Hives.  Social History Jess Barters, CMA;  11/03/2020 2:20 PM) Caffeine use Carbonated beverages, Tea. No alcohol use No drug use Tobacco use Never smoker.  Family History Jess Barters, CMA; 11/03/2020 2:20 PM) Cancer Mother. Depression Mother. Diabetes Mellitus Father, Son. Hypertension Father.  Pregnancy / Birth History Jess Barters, CMA; 11/03/2020 2:20 PM) Age at menarche 86 years. Age of menopause 57-55 Gravida 3 Maternal age 84-25 Para 2  Other Problems Jess Barters, CMA; 11/03/2020 2:20 PM) Anxiety Disorder Arthritis General anesthesia - complications Heart murmur High blood pressure Hypercholesterolemia     Review of Systems Jess Barters CMA; 11/03/2020 2:20 PM) General Not Present- Appetite Loss, Chills, Fatigue, Fever, Night Sweats, Weight Gain and Weight Loss. Skin Present- Dryness. Not Present- Change in Wart/Mole, Hives, Jaundice, New Lesions, Non-Healing Wounds, Rash and Ulcer. HEENT Present- Ringing in the Ears, Seasonal Allergies, Sinus Pain and Wears glasses/contact lenses. Not Present- Earache, Hearing Loss, Hoarseness, Nose Bleed, Oral Ulcers, Sore Throat, Visual Disturbances and Yellow Eyes. Respiratory Not Present- Bloody sputum, Chronic Cough, Difficulty Breathing, Snoring and Wheezing. Cardiovascular Not Present- Chest Pain, Difficulty Breathing Lying Down, Leg Cramps, Palpitations, Rapid Heart Rate, Shortness of Breath and Swelling of Extremities. Gastrointestinal Present- Bloating, Constipation and Indigestion. Not Present- Abdominal Pain, Bloody Stool, Change in Bowel Habits, Chronic diarrhea, Difficulty Swallowing, Excessive gas, Gets full quickly at meals, Hemorrhoids, Nausea, Rectal Pain and Vomiting. Female Genitourinary Not Present- Frequency, Nocturia,  Painful Urination, Pelvic Pain and Urgency. Musculoskeletal Present- Joint Pain and Joint Stiffness. Not Present- Back Pain, Muscle Pain, Muscle Weakness and Swelling of Extremities. Neurological Present- Tingling and Tremor. Not Present- Decreased Memory, Fainting, Headaches, Numbness, Seizures, Trouble walking and Weakness. Psychiatric Present- Anxiety. Not Present- Bipolar, Change in Sleep Pattern, Depression, Fearful and Frequent crying. Endocrine Present- Hair Changes. Not Present- Cold Intolerance, Excessive Hunger, Heat Intolerance, Hot flashes and New Diabetes. Hematology Not Present- Blood Thinners, Easy Bruising, Excessive bleeding, Gland problems, HIV and Persistent Infections.  Vitals Jess Barters CMA; 11/03/2020 2:20 PM) 11/03/2020 2:20 PM Weight: 149.38 lb Height: 62in Body Surface Area: 1.69 m Body Mass Index: 27.32 kg/m  Temp.: 97.72F  Pulse: 92 (Regular)  P.OX: 97% (Room air) BP: 140/72(Sitting, Left Arm, Standard)        Physical Exam (Euphemia Lingerfelt A. Tyron Manetta MD; 11/03/2020 3:52 PM)  General Mental Status-Alert. General Appearance-Consistent with stated age. Hydration-Well hydrated. Voice-Normal.  Head and Neck Head-normocephalic, atraumatic with no lesions or palpable masses. Trachea-midline. Thyroid Gland Characteristics - normal size and consistency.  Breast Note: Left pressures bruising in the medial. Regular position. Small hematoma noted. No other masses. Right breast is normal. No evidence of nipple discharge bilaterally.  Neurologic Neurologic evaluation reveals -alert and oriented x 3 with no impairment of recent or remote memory. Mental Status-Normal.  Lymphatic Head & Neck  General Head & Neck Lymphatics: Bilateral - Description - Normal. Axillary  General Axillary Region: Bilateral - Description - Normal. Tenderness - Non Tender.    Assessment & Plan (Serai Tukes A. Weronika Birch MD; 11/03/2020 3:54 PM)  BREAST NEOPLASM, TIS  (DCIS), LEFT (D05.12) Impression: Discussed medical and surgical options for treatment. Referral to medical and radiation oncology. Discussed mastectomy, breast conserving surgery and COMET trial. Given the mass lesion, she would not be a trial participant. She has opted for left breast seed localized lumpectomy. We discussed risk of surgery and potential risks, reexcision depending on final pathology. Risk of lumpectomy include bleeding, infection, seroma, more surgery, use of seed/wire, wound care, cosmetic deformity and the need for other treatments, death , blood clots, death. Pt agrees to  proceed.  TOTAL TIME 45 MINUTES  Current Plans Pt Education - [facility] Free Text Education/Instructions: discussed with patient and provided information. Pt Education - [facility] Breast Biopsy HCI: discussed with patient and provided information. Pt Education - flb breast cancer surgery: discussed with patient and provided information.

## 2020-11-03 NOTE — H&P (View-Only) (Signed)
[name] L Skillman Appointment: 11/03/2020 2:20 PM Location: [facility] Patient #: [id number] DOB: 07/22/1955 Divorced / Language: English / Race: White Female  History of Present Illness (Karris Deangelo A. Lala Been MD; 11/03/2020 3:51 PM) Patient words: Patient sent request of the [facility] [place] due to an abnormal mammogram. She was noted to have an area in the left upper quadrant adjacent to the nipple complexes showed density that was poorly defined by mammogram. There are also calcifications. Core biopsy was done which showed residual scar with intermediate grade DCIS. The total diameter of the area was 1 cm.        Patient returns after screening study for evaluation of possible LEFT asymmetry with calcifications.  EXAM: DIGITAL DIAGNOSTIC UNILATERAL LEFT MAMMOGRAM WITH TOMOSYNTHESIS AND CAD; ULTRASOUND LEFT BREAST LIMITED  TECHNIQUE: Left digital diagnostic mammography and breast tomosynthesis was performed. The images were evaluated with computer-aided detection.; Targeted ultrasound examination of the left breast was performed  COMPARISON: Previous exam(s).  ACR Breast Density Category c: The breast tissue is heterogeneously dense, which may obscure small masses.  FINDINGS: Additional 2-D and 3-D images are performed. These views confirm presence of asymmetry with mild distortion and coarse calcifications in the UPPER INNER QUADRANT of the LEFT breast. The area is estimated to measure 1.0 x 0.9 x 1.4 centimeters.  On physical exam, I palpate no abnormality in the UPPER retroareolar region of the RIGHT breast.  Targeted ultrasound is performed, showing a group of microcysts in the 11 o'clock location of the LEFT breast 1 centimeter from the nipple associated with calcifications and possibly representing the abnormality seen mammographically. Sonographic correlate for the distortion is not well seen.  Evaluation of the LEFT axilla is negative for  adenopathy.  IMPRESSION: Persistent asymmetry with mild distortion in the anterior portion of the LEFT breast, in the UPPER INNER QUADRANT retroareolar region without sonographic correlate.  No LEFT axillary adenopathy.  RECOMMENDATION: Recommend stereotactic guided core biopsy of LEFT breast.  I have discussed the findings and recommendations with the patient. If applicable, a reminder letter will be sent to the patient regarding the next appointment.  BI-RADS CATEGORY 4: Suspicious.   Electronically Signed By: Elizabeth Brown M.D. 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 Prognostic markers (ER/PR) are pending and will be reported in an addendum. Dr. Patrick.  The patient is a 65 year old female.   Past Surgical History (Ania Jones, CMA; 11/03/2020 2:20 PM) Breast Biopsy Bilateral. Cesarean Section - Multiple Colon Polyp Removal - Colonoscopy Hysterectomy (not due to cancer) - Partial Oral Surgery Spinal Surgery - Neck  Diagnostic Studies History (Ania Jones, CMA; 11/03/2020 2:20 PM) Colonoscopy within last year Mammogram within last year Pap Smear >5  years ago  Allergies (Ania Jones, CMA; 11/03/2020 2:21 PM) Statins Depletion *DIETARY PRODUCTS/DIETARY MANAGEMENT PRODUCTS* Chest pain, Cough. Mobic *ANALGESICS - ANTI-INFLAMMATORY* Hives.  Social History (Ania Jones, CMA;   11/03/2020 2:20 PM) Caffeine use Carbonated beverages, Tea. No alcohol use No drug use Tobacco use Never smoker.  Family History Jess Barters, CMA; 11/03/2020 2:20 PM) Cancer Mother. Depression Mother. Diabetes Mellitus Father, Son. Hypertension Father.  Pregnancy / Birth History Jess Barters, CMA; 11/03/2020 2:20 PM) Age at menarche 86 years. Age of menopause 57-55 Gravida 3 Maternal age 84-25 Para 2  Other Problems Jess Barters, CMA; 11/03/2020 2:20 PM) Anxiety Disorder Arthritis General anesthesia - complications Heart murmur High blood pressure Hypercholesterolemia     Review of Systems Jess Barters CMA; 11/03/2020 2:20 PM) General Not Present- Appetite Loss, Chills, Fatigue, Fever, Night Sweats, Weight Gain and Weight Loss. Skin Present- Dryness. Not Present- Change in Wart/Mole, Hives, Jaundice, New Lesions, Non-Healing Wounds, Rash and Ulcer. HEENT Present- Ringing in the Ears, Seasonal Allergies, Sinus Pain and Wears glasses/contact lenses. Not Present- Earache, Hearing Loss, Hoarseness, Nose Bleed, Oral Ulcers, Sore Throat, Visual Disturbances and Yellow Eyes. Respiratory Not Present- Bloody sputum, Chronic Cough, Difficulty Breathing, Snoring and Wheezing. Cardiovascular Not Present- Chest Pain, Difficulty Breathing Lying Down, Leg Cramps, Palpitations, Rapid Heart Rate, Shortness of Breath and Swelling of Extremities. Gastrointestinal Present- Bloating, Constipation and Indigestion. Not Present- Abdominal Pain, Bloody Stool, Change in Bowel Habits, Chronic diarrhea, Difficulty Swallowing, Excessive gas, Gets full quickly at meals, Hemorrhoids, Nausea, Rectal Pain and Vomiting. Female Genitourinary Not Present- Frequency, Nocturia,  Painful Urination, Pelvic Pain and Urgency. Musculoskeletal Present- Joint Pain and Joint Stiffness. Not Present- Back Pain, Muscle Pain, Muscle Weakness and Swelling of Extremities. Neurological Present- Tingling and Tremor. Not Present- Decreased Memory, Fainting, Headaches, Numbness, Seizures, Trouble walking and Weakness. Psychiatric Present- Anxiety. Not Present- Bipolar, Change in Sleep Pattern, Depression, Fearful and Frequent crying. Endocrine Present- Hair Changes. Not Present- Cold Intolerance, Excessive Hunger, Heat Intolerance, Hot flashes and New Diabetes. Hematology Not Present- Blood Thinners, Easy Bruising, Excessive bleeding, Gland problems, HIV and Persistent Infections.  Vitals Jess Barters CMA; 11/03/2020 2:20 PM) 11/03/2020 2:20 PM Weight: 149.38 lb Height: 62in Body Surface Area: 1.69 m Body Mass Index: 27.32 kg/m  Temp.: 97.72F  Pulse: 92 (Regular)  P.OX: 97% (Room air) BP: 140/72(Sitting, Left Arm, Standard)        Physical Exam (Kohl Polinsky A. Nastacia Raybuck MD; 11/03/2020 3:52 PM)  General Mental Status-Alert. General Appearance-Consistent with stated age. Hydration-Well hydrated. Voice-Normal.  Head and Neck Head-normocephalic, atraumatic with no lesions or palpable masses. Trachea-midline. Thyroid Gland Characteristics - normal size and consistency.  Breast Note: Left pressures bruising in the medial. Regular position. Small hematoma noted. No other masses. Right breast is normal. No evidence of nipple discharge bilaterally.  Neurologic Neurologic evaluation reveals -alert and oriented x 3 with no impairment of recent or remote memory. Mental Status-Normal.  Lymphatic Head & Neck  General Head & Neck Lymphatics: Bilateral - Description - Normal. Axillary  General Axillary Region: Bilateral - Description - Normal. Tenderness - Non Tender.    Assessment & Plan (Adanely Reynoso A. Neil Errickson MD; 11/03/2020 3:54 PM)  BREAST NEOPLASM, TIS  (DCIS), LEFT (D05.12) Impression: Discussed medical and surgical options for treatment. Referral to medical and radiation oncology. Discussed mastectomy, breast conserving surgery and COMET trial. Given the mass lesion, she would not be a trial participant. She has opted for left breast seed localized lumpectomy. We discussed risk of surgery and potential risks, reexcision depending on final pathology. Risk of lumpectomy include bleeding, infection, seroma, more surgery, use of seed/wire, wound care, cosmetic deformity and the need for other treatments, death , blood clots, death. Pt agrees to  proceed.  TOTAL TIME 45 MINUTES  Current Plans Pt Education - [facility] Free Text Education/Instructions: discussed with patient and provided information. Pt Education - [facility] Breast Biopsy HCI: discussed with patient and provided information. Pt Education - flb breast cancer surgery: discussed with patient and provided information. 

## 2020-11-04 ENCOUNTER — Telehealth: Payer: Self-pay | Admitting: Hematology and Oncology

## 2020-11-04 NOTE — Telephone Encounter (Signed)
Received a new pt referral from Dr. Brantley Stage for new dx of breast cancer. Ms. Neubert has been cld and scheduled to see Dr. Lindi Adie on 4/26 at 4:15pm. Pt aware to arrive 20 minutes early.

## 2020-11-05 ENCOUNTER — Other Ambulatory Visit: Payer: Self-pay | Admitting: Surgery

## 2020-11-05 ENCOUNTER — Ambulatory Visit: Payer: Self-pay | Admitting: Surgery

## 2020-11-05 DIAGNOSIS — D0512 Intraductal carcinoma in situ of left breast: Secondary | ICD-10-CM

## 2020-11-05 NOTE — Progress Notes (Signed)
[place] NOTE  Patient Care Team: Pleas Koch, NP as PCP - General (Internal Medicine) [name], Wonda Cheng, MD as PCP - Cardiology (Cardiology) Herold Harms (Physician Assistant) Vania Rea, MD as Consulting Physician (Obstetrics and Gynecology)  CHIEF COMPLAINTS/PURPOSE OF CONSULTATION:  Newly diagnosed left breast DCIS  HISTORY OF PRESENTING ILLNESS:  Nancy [name] 65 y.o. female is here because of recent diagnosis of DCIS of the left breast. Screening mammogram on 10/02/20 showed asymmetry and calcifications in the left breast. Diagnostic mammogram and US of the left breast on 10/20/20 showed a group of microcysts at the 11 o'clock position with no left axillary adenopathy. 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 She presents to the clinic today for initial evaluation and discussion of treatment options.   I reviewed her records extensively and collaborated the history with the patient.  SUMMARY OF ONCOLOGIC HISTORY: Oncology History  Ductal carcinoma in situ (DCIS) of left breast  10/23/2020 Initial Diagnosis   Screening mammogram on 10/02/20 showed asymmetry and calcifications in the left breast. Diagnostic mammogram and US of the left breast on 10/20/20 left breast distortion measuring 1.4 cm without ultrasound correlate. 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 Chronic neck pain 04/11/2018  . Eczema   . Essential hypertension 07/20/2019  . Fatigue   . GAD (generalized anxiety disorder) 06/26/2018  . Hyperlipidemia   . Lichen sclerosus   . Overweight (BMI 25.0-29.9) 01/09/2018  . Recurrent upper respiratory infection (URI)   . Routine general medical examination at a health care facility   . Urinary frequency 06/06/2019  . Urticaria   . Vaginal itching 06/06/2019    SURGICAL  HISTORY: Past Surgical History:  Procedure Laterality Date  . ANTERIOR CERVICAL DECOMP/DISCECTOMY FUSION    . COLONOSCOPY WITH PROPOFOL N/A 10/06/2020   Procedure: COLONOSCOPY WITH PROPOFOL;  Surgeon: Virgel Manifold, MD;  Location: [facility] ENDOSCOPY;  Service: Endoscopy;  Laterality: N/A;  . PARTIAL HYSTERECTOMY    . TUBAL LIGATION      SOCIAL HISTORY: Social History   Socioeconomic History  . Marital status: Single    Spouse name: Not on file  . Number of children: 2  . Years of education: Not on file  . Highest education level: Not on file  Occupational History  . Not on file  Tobacco Use  . Smoking status: Never Smoker  . Smokeless tobacco: Never Used  Vaping Use  . Vaping Use: Never used  Substance and Sexual Activity  . Alcohol use: Not Currently    Alcohol/week: 0.0 standard drinks  . Drug use: No  . Sexual activity: Not on file  Other Topics Concern  . Not on file  Social History Narrative   Divorced.  G3P2.   Regular exercise-yes   Social Determinants of Health   Financial Resource Strain: Not on file  Food Insecurity: Not on file  Transportation Needs: Not on file  Physical Activity: Not on file  Stress: Not on file  Social Connections: Not on file  Intimate Partner Violence: Not on file    FAMILY HISTORY: Family History  Problem Relation Age of Onset  . Hydrocephalus Mother   . Neuropathy Mother   . Diabetes Father   . Hypertension Father   . Thyroid cancer Cousin   . Thyroid disease Maternal Aunt  ALLERGIES:  is allergic to macrobid [nitrofurantoin], other, statins, and zetia [ezetimibe].  MEDICATIONS:  Current Outpatient Medications  Medication Sig Dispense Refill  . Alirocumab (PRALUENT) 75 MG/ML SOAJ Inject 1 pen into the skin every 14 (fourteen) days. 6 mL 3  . busPIRone (BUSPAR) 5 MG tablet Take 1 tablet (5 mg total) by mouth 2 (two) times daily. For anxiety. 60 tablet 0  . citalopram (CELEXA) 20 MG tablet TAKE 1 TABLET (20 MG  TOTAL) BY MOUTH DAILY. FOR ANXIETY. (Patient taking differently: Take 10 mg by mouth daily.) 90 tablet 1  . clobetasol cream (TEMOVATE) 7.93 % Apply 1 application topically as needed.    . Cyanocobalamin (B-12) 1000 MCG CAPS     . diphenhydrAMINE HCl ([employer] ALLERGY PO) Take by mouth.    . EPINEPHrine 0.3 mg/0.3 mL IJ SOAJ injection Inject 0.3 mg into the muscle as needed for anaphylaxis. 1 each 0   No current facility-administered medications for this visit.    REVIEW OF SYSTEMS:   Constitutional: Denies fevers, chills or abnormal night sweats Breast: Denies any palpable lumps or discharge All other systems were reviewed with the patient and are negative.  PHYSICAL EXAMINATION: ECOG PERFORMANCE STATUS: 1 - Symptomatic but completely ambulatory  Vitals:   11/06/20 1557  BP: (!) 158/70  Pulse: 86  Resp: 19  Temp: 97.6 F (36.4 C)  SpO2: 100%   Filed Weights   11/06/20 1557  Weight: 149 lb 9.6 oz (67.9 kg)      LABORATORY DATA:  I have reviewed the data as listed Lab Results  Component Value Date   WBC 6.0 05/26/2020   HGB 13.7 05/26/2020   HCT 41.6 05/26/2020   MCV 86.9 05/26/2020   PLT 232.0 05/26/2020   Lab Results  Component Value Date   NA 139 05/26/2020   K 4.9 05/26/2020   CL 102 05/26/2020   CO2 31 05/26/2020    RADIOGRAPHIC STUDIES: I have personally reviewed the radiological reports and agreed with the findings in the report.  ASSESSMENT AND PLAN:  Ductal carcinoma in situ (DCIS) of left breast Screening mammogram on 10/02/20 showed asymmetry and calcifications in the left breast. Diagnostic mammogram and US of the left breast on 10/20/20 left breast distortion measuring 1.4 cm without ultrasound correlate. 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 It is considered a precancerous lesion. DCIS is classified as a  0. It is generally detected through mammograms as calcifications. We discussed the significance of grades and its impact on prognosis. We also discussed the importance of ER and PR receptors and their implications to adjuvant treatment options. Prognosis of DCIS dependence on grade, comedo necrosis. It is anticipated that if not treated, 20-30% of DCIS can develop into invasive breast cancer.  Recommendation: 1. Breast conserving surgery vs COMET clinical trial 2. Followed by adjuvant radiation therapy 3. Followed by antiestrogen therapy with tamoxifen 5 years  Tamoxifen counseling: We discussed the risks and benefits of tamoxifen. These include but not limited to insomnia, hot flashes, mood changes, vaginal dryness, and weight gain. Although rare, serious side effects including endometrial cancer, risk of blood clots were also discussed. We strongly believe that the benefits far outweigh the risks. Patient understands these risks and consented to starting treatment. Planned treatment duration is 5 years.  AFT 25 COMET Phase 3 clinical trial for low risk DCIS grade 1/2  PR positive, age greater than 40 randomized to surgery +/- radiation, +/- endocrine therapy versus active surveillance with +/- endocrine therapy surveillance with mammograms every 6 months for 5 years;patient's have option to decline elevated arm and still be followed on study   Return to clinic after surgery to discuss the final pathology report and come up with an adjuvant treatment plan.  All questions were answered. The patient knows to call the clinic with any problems, questions or concerns.   Rulon Eisenmenger, MD, MPH 11/06/2020    I, Molly Dorshimer, am acting as scribe for Nicholas Lose, MD.  I have reviewed the above documentation for accuracy and completeness, and I agree with the above.

## 2020-11-06 ENCOUNTER — Encounter (HOSPITAL_BASED_OUTPATIENT_CLINIC_OR_DEPARTMENT_OTHER): Payer: Self-pay | Admitting: Surgery

## 2020-11-06 ENCOUNTER — Inpatient Hospital Stay: Payer: PPO | Attending: Hematology and Oncology | Admitting: Hematology and Oncology

## 2020-11-06 ENCOUNTER — Other Ambulatory Visit: Payer: Self-pay

## 2020-11-06 ENCOUNTER — Encounter: Payer: Self-pay | Admitting: Emergency Medicine

## 2020-11-06 ENCOUNTER — Inpatient Hospital Stay: Payer: PPO | Admitting: Emergency Medicine

## 2020-11-06 DIAGNOSIS — D0512 Intraductal carcinoma in situ of left breast: Secondary | ICD-10-CM | POA: Insufficient documentation

## 2020-11-06 NOTE — Research (Unsigned)
Trial: AFT - 25: COMPARING AN OPERATION TO MONITORING, WITH OR WITHOUT ENDOCRINE THERAPY (COMET) FOR LOW RISK DCIS: A PHASE III PROSPECTIVE RANDOMIZED TRIAL  Patient Nancy [name] was identified by MD Lindi Adie as a potential candidate for the above listed study.  This Clinical Research Nurse met with Nancy [name], [id number], on 11/06/20 in a manner and location that ensures patient privacy to discuss participation in the above listed research study.  Patient is Unaccompanied.  A copy of the informed consent document with embedded HIPAA language was provided to the patient.  Patient reads, speaks, and understands [place].   Patient was provided with the business card of this Nurse and encouraged to contact the research team with any questions.  Approximately 20 minutes were spent with the patient reviewing the informed consent documents.  Patient was provided the option of taking informed consent documents home to review and was encouraged to review at their convenience with their support network, including other care providers. Patient took the consent documents home to review.  Pt states she is very interested in lumpectomy suggested by her surgeon, but is considering other options as well.  PT states that her surgeon did not think she was eligible for COMET study as she has 'microcysts' in her breast.  Will f/u on potential eligibility conflict.  Pt encouraged to call this Research RN as needed with any questions/concerns, verbalized understanding.  Will contact patient to f/u on potential study interest in 1 week.  Nancy [name] '[place], RN, BSN Clinical Research Nurse I 11/06/20 5:05 PM

## 2020-11-06 NOTE — Assessment & Plan Note (Addendum)
Screening mammogram on 10/02/20 showed asymmetry and calcifications in the left breast. Diagnostic mammogram and US of the left breast on 10/20/20 left breast distortion measuring 1.4 cm without ultrasound correlate. 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 It is considered a precancerous lesion. DCIS is classified as a 0. It is generally detected through mammograms as calcifications. We discussed the significance of grades and its impact on prognosis. We also discussed the importance of ER and PR receptors and their implications to adjuvant treatment options. Prognosis of DCIS dependence on grade, comedo necrosis. It is anticipated that if not treated, 20-30% of DCIS can develop into invasive breast cancer.  Recommendation: 1. Breast conserving surgery vs COMET clinical trial 2. Followed by adjuvant radiation therapy 3. Followed by antiestrogen therapy with tamoxifen 5 years  Tamoxifen counseling: We discussed the risks and benefits of tamoxifen. These include but not limited to insomnia, hot flashes, mood changes, vaginal dryness, and weight gain. Although rare, serious side effects including endometrial cancer, risk of blood clots were also discussed. We strongly believe that the benefits far outweigh the risks. Patient understands these risks and consented to starting treatment. Planned treatment duration is 5 years.  AFT 25 COMET Phase 3 clinical trial for low risk DCIS grade 1/2 PR positive, age greater than 40 randomized to surgery +/- radiation, +/- endocrine therapy versus active surveillance with +/- endocrine therapy surveillance with mammograms every 6 months for 5 years;patient's have option to decline elevated arm and still be followed on study   Return to clinic after surgery to discuss the final pathology report and come up with an adjuvant  treatment plan.

## 2020-11-07 ENCOUNTER — Encounter: Payer: Self-pay | Admitting: *Deleted

## 2020-11-11 ENCOUNTER — Ambulatory Visit
Admission: RE | Admit: 2020-11-11 | Discharge: 2020-11-11 | Disposition: A | Discharge status: Home / Self Care | Payer: PPO | Source: Ambulatory Visit | Attending: Radiation Oncology | Admitting: Radiation Oncology

## 2020-11-11 ENCOUNTER — Encounter: Payer: Self-pay | Admitting: Radiation Oncology

## 2020-11-11 ENCOUNTER — Other Ambulatory Visit: Payer: Self-pay

## 2020-11-11 VITALS — Ht 63.0 in | Wt 148.0 lb

## 2020-11-11 DIAGNOSIS — D0512 Intraductal carcinoma in situ of left breast: Secondary | ICD-10-CM | POA: Diagnosis not present

## 2020-11-11 HISTORY — DX: Allergy to other foods: Z91.018

## 2020-11-11 NOTE — Progress Notes (Signed)
Radiation Oncology         [phone] ________________________________  Initial Outpatient Consultation - Conducted via telephone due to current COVID-19 concerns for limiting patient exposure  I spoke with the patient to conduct this consult visit via telephone to spare the patient unnecessary potential exposure in the healthcare setting during the current COVID-19 pandemic. The patient was notified in advance and was offered a [place] meeting to allow for face to face communication but unfortunately reported that they did not have the appropriate resources/technology to support such a visit and instead preferred to proceed with a telephone consult.    Name: Nancy [name]        MRN: [id number]  Date of Service: 11/11/2020 DOB: 09-23-1955  JT:[id number], Leticia Penna, NP  Erroll Luna, MD     REFERRING PHYSICIAN: Erroll Luna, MD   DIAGNOSIS: The encounter diagnosis was Ductal carcinoma in situ (DCIS) of left breast.   HISTORY OF PRESENT ILLNESS: Nancy [name] is a 65 y.o. female with a new diagnosis of left breast cancer. The patient was noted to have a screening detected group of calcifications and asymmetry in the left breast. In the upper inner quadrant a 1.4 cm group of calcifications were noted and her axilla was negative by ultrasound. She underwent a biopsy on 10/23/20 that revealed an intermediate grade DCIS arising in a complex sclerosing lesion with papillary changes. Her cancer was ER/PR positive. She met with Dr. Lindi Adie and also plans to proceed with lumpectomy on 11/14/20. She's contacted today by phone to discuss adjuvant treatment.     PREVIOUS RADIATION THERAPY: No   PAST MEDICAL HISTORY:  Past Medical History:  Diagnosis Date  . Chronic neck pain 04/11/2018  . Eczema   . Essential hypertension 07/20/2019  . Fatigue   . GAD (generalized anxiety disorder) 06/26/2018  . Hyperlipidemia   . Lichen sclerosus   . Overweight (BMI 25.0-29.9) 01/09/2018  . Recurrent upper  respiratory infection (URI)   . Routine general medical examination at a health care facility   . Urinary frequency 06/06/2019  . Urticaria   . Vaginal itching 06/06/2019       PAST SURGICAL HISTORY: Past Surgical History:  Procedure Laterality Date  . ANTERIOR CERVICAL DECOMP/DISCECTOMY FUSION    . COLONOSCOPY WITH PROPOFOL N/A 10/06/2020   Procedure: COLONOSCOPY WITH PROPOFOL;  Surgeon: Virgel Manifold, MD;  Location: [facility] ENDOSCOPY;  Service: Endoscopy;  Laterality: N/A;  . PARTIAL HYSTERECTOMY    . TUBAL LIGATION       FAMILY HISTORY:  Family History  Problem Relation Age of Onset  . Hydrocephalus Mother   . Neuropathy Mother   . Diabetes Father   . Hypertension Father   . Thyroid cancer Cousin   . Thyroid disease Maternal Aunt      SOCIAL HISTORY:  reports that she has never smoked. She has never used smokeless tobacco. She reports previous alcohol use. She reports that she does not use drugs. The patient is married and lives in [place]. She has grandchildren and helps with caring for them. She is retired and enjoys restoring old furniture.    ALLERGIES: Macrobid [nitrofurantoin], Other, Statins, and Zetia [ezetimibe]   MEDICATIONS:  Current Outpatient Medications  Medication Sig Dispense Refill  . Alirocumab (PRALUENT) 75 MG/ML SOAJ Inject 1 pen into the skin every 14 (fourteen) days. 6 mL 3  . busPIRone (BUSPAR) 5 MG tablet Take 1 tablet (5 mg total) by mouth 2 (two) times daily. For anxiety. 60 tablet  0  . citalopram (CELEXA) 20 MG tablet TAKE 1 TABLET (20 MG TOTAL) BY MOUTH DAILY. FOR ANXIETY. (Patient taking differently: Take 10 mg by mouth daily.) 90 tablet 1  . clobetasol cream (TEMOVATE) 1.99 % Apply 1 application topically as needed.    . Cyanocobalamin (B-12) 1000 MCG CAPS     . diphenhydrAMINE HCl ([employer] ALLERGY PO) Take by mouth.    . EPINEPHrine 0.3 mg/0.3 mL IJ SOAJ injection Inject 0.3 mg into the muscle as needed for anaphylaxis. 1 each 0    No current facility-administered medications for this encounter.     REVIEW OF SYSTEMS: On review of systems, the patient reports that she is doing well and denies any concerns with breast symptoms at this time. No complaints are otherwise verbalized.    PHYSICAL EXAM:  Wt Readings from Last 3 Encounters:  11/06/20 149 lb 9.6 oz (67.9 kg)  10/06/20 148 lb (67.1 kg)  08/06/20 149 lb (67.6 kg)   Unable to assess due to encounter type.    ECOG = 0  0 - Asymptomatic (Fully active, able to carry on all predisease activities without restriction)  1 - Symptomatic but completely ambulatory (Restricted in physically strenuous activity but ambulatory and able to carry out work of a light or sedentary nature. For example, light housework, office work)  2 - Symptomatic, <50% in bed during the day (Ambulatory and capable of all self care but unable to carry out any work activities. Up and about more than 50% of waking hours)  3 - Symptomatic, >50% in bed, but not bedbound (Capable of only limited self-care, confined to bed or chair 50% or more of waking hours)  4 - Bedbound (Completely disabled. Cannot carry on any self-care. Totally confined to bed or chair)  5 - Death   Eustace Pen MM, Creech RH, Tormey DC, et al. [phone]). "Toxicity and response criteria of the [facility]". [place] Oncol. 5 (6): 649-55    LABORATORY DATA:  Lab Results  Component Value Date   WBC 6.0 05/26/2020   HGB 13.7 05/26/2020   HCT 41.6 05/26/2020   MCV 86.9 05/26/2020   PLT 232.0 05/26/2020   Lab Results  Component Value Date   NA 139 05/26/2020   K 4.9 05/26/2020   CL 102 05/26/2020   CO2 31 05/26/2020   Lab Results  Component Value Date   ALT 11 05/26/2020   AST 15 05/26/2020   ALKPHOS 82 05/26/2020   BILITOT 0.6 05/26/2020      RADIOGRAPHY: DG Bone Density  Result Date: 10/21/2020 EXAM: DUAL X-RAY ABSORPTIOMETRY (DXA) FOR BONE MINERAL DENSITY IMPRESSION: Referring  Physician:  Pleas Koch Your patient completed a bone mineral density test using GE Lunar iDXA system (analysis version: 16). Technologist: Bruno PATIENT: Name: Diyana, Nancy [name] Patient ID: [id number] Birth Date: 11-12-1955 Height: 63.0 in. Sex: Female Measured: 10/21/2020 Weight: 150.2 lbs. Indications: Caucasian, Celexa, Estrogen Deficient, Family History of Osteoporosis, Hysterectomy, Postmenopausal Fractures: Toe Treatments: ASSESSMENT: The BMD measured at Femur Neck Left is 0.910 g/cm2 with a T-score of -0.9. This patient is considered normal according to [facility]) criteria. The quality of the exam is good. L4 was excluded due to degenerative changes. Site Region Measured Date Measured Age YA BMD Significant CHANGE T-score DualFemur Neck Left  10/21/2020    65.8         -0.9    0.910 g/cm2 AP Spine  L1-L3      10/21/2020  65.8         0.3     1.202 g/cm2 DualFemur Total Mean 10/21/2020    65.8         -0.4    0.953 g/cm2 World Health Organization [facility]) criteria for post-menopausal, Caucasian Women: Normal       T-score at or above -1 SD Osteopenia   T-score between -1 and -2.5 SD Osteoporosis T-score at or below -2.5 SD RECOMMENDATION: 1. All patients should optimize calcium and vitamin D intake. 2. Consider FDA-approved medical therapies in postmenopausal women and men aged 39 years and older, based on the following: a. A hip or vertebral (clinical or morphometric) fracture. b. T-score = -2.5 at the femoral neck or spine after appropriate evaluation to exclude secondary causes. c. Low bone mass (T-score between -1.0 and -2.5 at the femoral neck or spine) and a 10-year probability of a hip fracture = 3% or a 10-year probability of a major osteoporosis-related fracture = 20% based on the [place]-adapted WHO algorithm. d. Clinician judgment and/or patient preferences may indicate treatment for people with 10-year fracture probabilities above or below these levels. FOLLOW-UP: Patients with  diagnosis of osteoporosis or at high risk for fracture should have regular bone mineral density tests.? Patients eligible for Medicare are allowed routine testing every 2 years.? The testing frequency can be increased to one year for patients who have rapidly progressing disease, are receiving or discontinuing medical therapy to restore bone mass, or have additional risk factors. I have reviewed this study and agree with the findings. Mark A. Thornton Papas, M.D. [facility] Electronically Signed   By: Lavonia Dana M.D.   On: 10/21/2020 17:02   US BREAST LTD UNI LEFT INC AXILLA  Result Date: 10/20/2020 CLINICAL DATA:  Patient returns after screening study for evaluation of possible LEFT asymmetry with calcifications. EXAM: DIGITAL DIAGNOSTIC UNILATERAL LEFT MAMMOGRAM WITH TOMOSYNTHESIS AND CAD; ULTRASOUND LEFT BREAST LIMITED TECHNIQUE: Left digital diagnostic mammography and breast tomosynthesis was performed. The images were evaluated with computer-aided detection.; Targeted ultrasound examination of the left breast was performed COMPARISON:  Previous exam(s). ACR Breast Density Category c: The breast tissue is heterogeneously dense, which may obscure small masses. FINDINGS: Additional 2-D and 3-D images are performed. These views confirm presence of asymmetry with mild distortion and coarse calcifications in the UPPER INNER QUADRANT of the LEFT breast. The area is estimated to measure 1.0 x 0.9 x 1.4 centimeters. On physical exam, I palpate no abnormality in the UPPER retroareolar region of the RIGHT breast. Targeted ultrasound is performed, showing a group of microcysts in the 11 o'clock location of the LEFT breast 1 centimeter from the nipple associated with calcifications and possibly representing the abnormality seen mammographically. Sonographic correlate for the distortion is not well seen. Evaluation of the LEFT axilla is negative for adenopathy. IMPRESSION: Persistent asymmetry with mild distortion  in the anterior portion of the LEFT breast, in the UPPER INNER QUADRANT retroareolar region without sonographic correlate. No LEFT axillary adenopathy. RECOMMENDATION: Recommend stereotactic guided core biopsy of LEFT breast. I have discussed the findings and recommendations with the patient. If applicable, a reminder letter will be sent to the patient regarding the next appointment. BI-RADS CATEGORY  4: Suspicious. Electronically Signed   By: Nolon Nations M.D.   On: 10/20/2020 14:40   MM DIAG BREAST TOMO UNI LEFT  Result Date: 10/20/2020 CLINICAL DATA:  Patient returns after screening study for evaluation of possible LEFT asymmetry with calcifications. EXAM: DIGITAL DIAGNOSTIC UNILATERAL LEFT MAMMOGRAM  WITH TOMOSYNTHESIS AND CAD; ULTRASOUND LEFT BREAST LIMITED TECHNIQUE: Left digital diagnostic mammography and breast tomosynthesis was performed. The images were evaluated with computer-aided detection.; Targeted ultrasound examination of the left breast was performed COMPARISON:  Previous exam(s). ACR Breast Density Category c: The breast tissue is heterogeneously dense, which may obscure small masses. FINDINGS: Additional 2-D and 3-D images are performed. These views confirm presence of asymmetry with mild distortion and coarse calcifications in the UPPER INNER QUADRANT of the LEFT breast. The area is estimated to measure 1.0 x 0.9 x 1.4 centimeters. On physical exam, I palpate no abnormality in the UPPER retroareolar region of the RIGHT breast. Targeted ultrasound is performed, showing a group of microcysts in the 11 o'clock location of the LEFT breast 1 centimeter from the nipple associated with calcifications and possibly representing the abnormality seen mammographically. Sonographic correlate for the distortion is not well seen. Evaluation of the LEFT axilla is negative for adenopathy. IMPRESSION: Persistent asymmetry with mild distortion in the anterior portion of the LEFT breast, in the UPPER INNER  QUADRANT retroareolar region without sonographic correlate. No LEFT axillary adenopathy. RECOMMENDATION: Recommend stereotactic guided core biopsy of LEFT breast. I have discussed the findings and recommendations with the patient. If applicable, a reminder letter will be sent to the patient regarding the next appointment. BI-RADS CATEGORY  4: Suspicious. Electronically Signed   By: Nolon Nations M.D.   On: 10/20/2020 14:40   MM CLIP PLACEMENT LEFT  Result Date: 10/23/2020 CLINICAL DATA:  Post procedure mammogram for clip placement. EXAM: DIAGNOSTIC LEFT MAMMOGRAM POST STEREOTACTIC BIOPSY COMPARISON:  Previous exam(s). FINDINGS: Mammographic images were obtained following stereotactic guided biopsy of distortion with calcifications in the upper inner left breast. The biopsy marking clip is in expected position at the site of biopsy. IMPRESSION: Appropriate positioning of the coil shaped biopsy marking clip at the site of biopsy in the upper inner left breast. Final Assessment: Post Procedure Mammograms for Marker Placement Electronically Signed   By: Audie Pinto M.D.   On: 10/23/2020 10:15   MM LT BREAST BX W LOC DEV 1ST LESION IMAGE BX SPEC STEREO GUIDE  Addendum Date: 10/24/2020   ADDENDUM REPORT: 10/24/2020 14:13 ADDENDUM: Pathology revealed INTERMEDIATE GRADE DUCTAL CARCINOMA IN SITU ARISING IN A COMPLEX SCLEROSING LESION WITH PAPILLOMA of the LEFT breast, upper inner. This was found to be concordant by Dr. Audie Pinto. Pathology results were discussed with the patient by telephone. The patient reported doing well after the biopsy with tenderness at the site. Post biopsy instructions and care were reviewed and questions were answered. The patient was encouraged to call [facility] for any additional concerns. Per patient request, surgical consultation has been arranged with Dr. Erroll Luna at [facility] on Nov 03, 2020. Pathology results reported  by Stacie Acres RN on 10/24/2020. Electronically Signed   By: Audie Pinto M.D.   On: 10/24/2020 14:13   Result Date: 10/24/2020 CLINICAL DATA:  65 year old female presenting for biopsy of left breast distortion with calcifications. EXAM: LEFT BREAST STEREOTACTIC CORE NEEDLE BIOPSY COMPARISON:  Previous exams. FINDINGS: The patient and I discussed the procedure of stereotactic-guided biopsy including benefits and alternatives. We discussed the high likelihood of a successful procedure. We discussed the risks of the procedure including infection, bleeding, tissue injury, clip migration, and inadequate sampling. Informed written consent was given. The usual time out protocol was performed immediately prior to the procedure. Using sterile technique and 1% Lidocaine as local anesthetic, under stereotactic  guidance, a 9 gauge vacuum assisted device was used to perform core needle biopsy of distortion with calcifications in the upper inner left breast using a superior approach. Specimen radiograph was performed showing at least 3 specimens with calcifications. Specimens with calcifications are identified for pathology. Lesion quadrant: Upper inner quadrant At the conclusion of the procedure, a coil tissue marker clip was deployed into the biopsy cavity. Follow-up 2-view mammogram was performed and dictated separately. IMPRESSION: Stereotactic-guided biopsy of distortion with calcifications in the upper inner left breast. No apparent complications. Electronically Signed: By: Audie Pinto M.D. On: 10/23/2020 10:16       IMPRESSION/PLAN: 1. ER/PR positive intermediate grade DCIS of the left breast. Dr. Lisbeth Renshaw discusses the pathology findings and reviews the nature of noninvasive left breast disease. The consensus from the breast conference includes breast conservation with lumpectomy followed by external radiotherapy to the breast  to reduce risks of local recurrence followed by antiestrogen therapy. We  discussed the risks, benefits, short, and long term effects of radiotherapy, as well as the curative intent, and the patient is interested in proceeding. Dr. Lisbeth Renshaw discusses the delivery and logistics of radiotherapy and anticipates a course of 4 weeks of radiotherapy to the left breast with deep inspiration breath hold technique. We will see her back a few weeks after surgery to discuss the simulation process and anticipate we starting radiotherapy about 4-6 weeks after surgery.    Given current concerns for patient exposure during the COVID-19 pandemic, this encounter was conducted via telephone.  The patient has provided two factor identification and has given verbal consent for this type of encounter and has been advised to only accept a meeting of this type in a secure network environment. The time spent during this encounter was 60 minutes including preparation, discussion, and coordination of the patient's care. The attendants for this meeting include Blenda Nicely, RN, Dr. Lisbeth Renshaw, Hayden Pedro  and [name].  During the encounter,  Blenda Nicely, RN, Dr. Lisbeth Renshaw, and Hayden Pedro were located at [facility] Radiation Oncology Department.  [name] was located at home.   The above documentation reflects my direct findings during this shared patient visit. Please see the separate note by Dr. Lisbeth Renshaw on this date for the remainder of the patient's plan of care.    Carola Rhine, [facility]    **Disclaimer: This note was dictated with voice recognition software. Similar sounding words can inadvertently be transcribed and this note may contain transcription errors which may not have been corrected upon publication of note.**

## 2020-11-11 NOTE — Progress Notes (Signed)
New Breast Cancer Diagnosis: Left Breast  Did patient present with symptoms (if so, please note symptoms) or screening mammography?:Screening Mass showed asymmetry and calcifications.   Location and Extent of disease :left breast. Group of microcysts located at 11 o'clock position, measured  1.4 cm in greatest dimension. Adenopathy no.  Histology per Pathology Report: grade intermediate, DCIS  10/23/2020  Receptor Status: ER(positive), PR (positive), Her2-neu (), Ki-(%)  Surgeon and surgical plan, if any: Dr. Cornett -Left Breast lumpectomy with radioactive seed localization 11/14/2020  Medical oncologist, treatment if any:   Dr. Gudena 11/06/2020 Recommendation: 1. Breast conserving surgery vs COMET clinical trial 2. Followed by adjuvant radiation therapy 3. Followed by antiestrogen therapy with tamoxifen 5 years   Family History of Breast/Ovarian/Prostate Cancer: none noted  Lymphedema issues, if any: No  Pain issues, if any:  No  SAFETY ISSUES: Prior radiation? no Pacemaker/ICD? no Possible current pregnancy? Hysterectomy Is the patient on methotrexate? no  Current Complaints / other details:   -She states she is not interested in the COMET trial. 

## 2020-11-12 ENCOUNTER — Telehealth: Payer: Self-pay | Admitting: Emergency Medicine

## 2020-11-12 ENCOUNTER — Ambulatory Visit
Admission: RE | Admit: 2020-11-12 | Discharge: 2020-11-12 | Disposition: A | Discharge status: Home / Self Care | Payer: PPO | Source: Ambulatory Visit | Attending: Surgery | Admitting: Surgery

## 2020-11-12 DIAGNOSIS — D0512 Intraductal carcinoma in situ of left breast: Secondary | ICD-10-CM | POA: Diagnosis not present

## 2020-11-12 NOTE — Telephone Encounter (Signed)
AFT - 25: COMPARING AN OPERATION TO MONITORING, WITH OR WITHOUT ENDOCRINE THERAPY (COMET) FOR LOW RISK DCIS: A PHASE III PROSPECTIVE RANDOMIZED TRIAL   Called patient to f/u on potential interest in COMET trial.  Pt declines to participate at this time and and is choosing to move forward with lumpectomy on 11/14/20.  Pt thanked for her time and encouraged to call back as needed with any further questions/concerns.  DCP-001 study not indicated for this trial.  Nancy [name] 'Nancy Im, RN, BSN Clinical Research Nurse I 11/12/20 9:15 AM

## 2020-11-13 ENCOUNTER — Ambulatory Visit: Payer: Self-pay | Admitting: Surgery

## 2020-11-13 ENCOUNTER — Encounter (HOSPITAL_BASED_OUTPATIENT_CLINIC_OR_DEPARTMENT_OTHER): Payer: Self-pay | Admitting: Surgery

## 2020-11-13 DIAGNOSIS — D0512 Intraductal carcinoma in situ of left breast: Secondary | ICD-10-CM

## 2020-11-13 NOTE — Anesthesia Preprocedure Evaluation (Addendum)
Anesthesia Evaluation  Patient identified by MRN, date of birth, ID band Patient awake    Reviewed: Allergy & Precautions, NPO status , Patient's Chart, lab work & pertinent test results  Airway Mallampati: II  TM Distance: >3 FB Neck ROM: Full    Dental  (+) Teeth Intact, Dental Advisory Given   Pulmonary neg pulmonary ROS,    breath sounds clear to auscultation       Cardiovascular hypertension,  Rhythm:Regular Rate:Normal     Neuro/Psych PSYCHIATRIC DISORDERS Anxiety negative neurological ROS     GI/Hepatic negative GI ROS, Neg liver ROS,   Endo/Other  negative endocrine ROS  Renal/GU negative Renal ROS     Musculoskeletal negative musculoskeletal ROS (+)   Abdominal Normal abdominal exam  (+)   Peds  Hematology negative hematology ROS (+)   Anesthesia Other Findings   Reproductive/Obstetrics                            Anesthesia Physical Anesthesia Plan  ASA: II  Anesthesia Plan: General   Post-op Pain Management:    Induction: Intravenous  PONV Risk Score and Plan: 4 or greater and Dexamethasone, Midazolam, Scopolamine patch - Pre-op and Ondansetron  Airway Management Planned: LMA  Additional Equipment: None  Intra-op Plan:   Post-operative Plan: Extubation in OR  Informed Consent: I have reviewed the patients History and Physical, chart, labs and discussed the procedure including the risks, benefits and alternatives for the proposed anesthesia with the patient or authorized representative who has indicated his/her understanding and acceptance.     Dental advisory given  Plan Discussed with: CRNA  Anesthesia Plan Comments:        Anesthesia Quick Evaluation

## 2020-11-14 ENCOUNTER — Encounter (HOSPITAL_BASED_OUTPATIENT_CLINIC_OR_DEPARTMENT_OTHER): Payer: Self-pay | Admitting: Surgery

## 2020-11-14 ENCOUNTER — Ambulatory Visit (HOSPITAL_BASED_OUTPATIENT_CLINIC_OR_DEPARTMENT_OTHER)
Admission: RE | Admit: 2020-11-14 | Discharge: 2020-11-14 | Disposition: A | Discharge status: Home / Self Care | Payer: PPO | Attending: Surgery | Admitting: Surgery

## 2020-11-14 ENCOUNTER — Encounter (HOSPITAL_BASED_OUTPATIENT_CLINIC_OR_DEPARTMENT_OTHER)
Admission: RE | Disposition: A | Discharge status: Home / Self Care | Payer: Self-pay | Source: Home / Self Care | Attending: Surgery

## 2020-11-14 ENCOUNTER — Ambulatory Visit (HOSPITAL_BASED_OUTPATIENT_CLINIC_OR_DEPARTMENT_OTHER): Payer: PPO | Admitting: Anesthesiology

## 2020-11-14 ENCOUNTER — Other Ambulatory Visit: Payer: Self-pay

## 2020-11-14 ENCOUNTER — Ambulatory Visit
Admission: RE | Admit: 2020-11-14 | Discharge: 2020-11-14 | Disposition: A | Discharge status: Home / Self Care | Payer: PPO | Source: Ambulatory Visit | Attending: Surgery | Admitting: Surgery

## 2020-11-14 DIAGNOSIS — N6012 Diffuse cystic mastopathy of left breast: Secondary | ICD-10-CM | POA: Diagnosis not present

## 2020-11-14 DIAGNOSIS — D242 Benign neoplasm of left breast: Secondary | ICD-10-CM | POA: Diagnosis not present

## 2020-11-14 DIAGNOSIS — Z8249 Family history of ischemic heart disease and other diseases of the circulatory system: Secondary | ICD-10-CM | POA: Insufficient documentation

## 2020-11-14 DIAGNOSIS — I10 Essential (primary) hypertension: Secondary | ICD-10-CM | POA: Diagnosis not present

## 2020-11-14 DIAGNOSIS — F411 Generalized anxiety disorder: Secondary | ICD-10-CM | POA: Diagnosis not present

## 2020-11-14 DIAGNOSIS — Z90711 Acquired absence of uterus with remaining cervical stump: Secondary | ICD-10-CM | POA: Diagnosis not present

## 2020-11-14 DIAGNOSIS — Z888 Allergy status to other drugs, medicaments and biological substances status: Secondary | ICD-10-CM | POA: Insufficient documentation

## 2020-11-14 DIAGNOSIS — N6092 Unspecified benign mammary dysplasia of left breast: Secondary | ICD-10-CM | POA: Diagnosis not present

## 2020-11-14 DIAGNOSIS — Z86010 Personal history of colonic polyps: Secondary | ICD-10-CM | POA: Diagnosis not present

## 2020-11-14 DIAGNOSIS — D0512 Intraductal carcinoma in situ of left breast: Secondary | ICD-10-CM

## 2020-11-14 DIAGNOSIS — E7800 Pure hypercholesterolemia, unspecified: Secondary | ICD-10-CM | POA: Insufficient documentation

## 2020-11-14 DIAGNOSIS — N6489 Other specified disorders of breast: Secondary | ICD-10-CM | POA: Diagnosis not present

## 2020-11-14 DIAGNOSIS — Z886 Allergy status to analgesic agent status: Secondary | ICD-10-CM | POA: Diagnosis not present

## 2020-11-14 DIAGNOSIS — E785 Hyperlipidemia, unspecified: Secondary | ICD-10-CM | POA: Diagnosis not present

## 2020-11-14 HISTORY — PX: BREAST LUMPECTOMY WITH RADIOACTIVE SEED LOCALIZATION: SHX6424

## 2020-11-14 LAB — CBC WITH DIFFERENTIAL/PLATELET
Abs Immature Granulocytes: 0.02 10*3/uL (ref 0.00–0.07)
Basophils Absolute: 0.1 10*3/uL (ref 0.0–0.1)
Basophils Relative: 1 %
Eosinophils Absolute: 0.3 10*3/uL (ref 0.0–0.5)
Eosinophils Relative: 5 %
HCT: 41.1 % (ref 36.0–46.0)
Hemoglobin: 13.8 g/dL (ref 12.0–15.0)
Immature Granulocytes: 0 %
Lymphocytes Relative: 32 %
Lymphs Abs: 1.9 10*3/uL (ref 0.7–4.0)
MCH: 29.3 pg (ref 26.0–34.0)
MCHC: 33.6 g/dL (ref 30.0–36.0)
MCV: 87.3 fL (ref 80.0–100.0)
Monocytes Absolute: 0.5 10*3/uL (ref 0.1–1.0)
Monocytes Relative: 9 %
Neutro Abs: 3.2 10*3/uL (ref 1.7–7.7)
Neutrophils Relative %: 53 %
Platelets: 229 10*3/uL (ref 150–400)
RBC: 4.71 MIL/uL (ref 3.87–5.11)
RDW: 12.5 % (ref 11.5–15.5)
WBC: 5.9 10*3/uL (ref 4.0–10.5)
nRBC: 0 % (ref 0.0–0.2)

## 2020-11-14 LAB — COMPREHENSIVE METABOLIC PANEL
ALT: 14 U/L (ref 0–44)
AST: 19 U/L (ref 15–41)
Albumin: 3.9 g/dL (ref 3.5–5.0)
Alkaline Phosphatase: 64 U/L (ref 38–126)
Anion gap: 7 (ref 5–15)
BUN: 9 mg/dL (ref 8–23)
CO2: 26 mmol/L (ref 22–32)
Calcium: 8.9 mg/dL (ref 8.9–10.3)
Chloride: 105 mmol/L (ref 98–111)
Creatinine, Ser: 0.68 mg/dL (ref 0.44–1.00)
GFR, Estimated: >60 mL/min (ref >60)
Glucose, Bld: 79 mg/dL (ref 70–99)
Potassium: 3.5 mmol/L (ref 3.5–5.1)
Sodium: 138 mmol/L (ref 135–145)
Total Bilirubin: 0.9 mg/dL (ref 0.3–1.2)
Total Protein: 6.7 g/dL (ref 6.5–8.1)

## 2020-11-14 SURGERY — BREAST LUMPECTOMY WITH RADIOACTIVE SEED LOCALIZATION
Anesthesia: General | Site: Breast | Laterality: Left

## 2020-11-14 MED ORDER — EPHEDRINE SULFATE 50 MG/ML IJ SOLN
INTRAMUSCULAR | Status: DC | PRN
  Administered 2020-11-14: 10 mg via INTRAVENOUS
  Administered 2020-11-14: 5 mg via INTRAVENOUS
  Administered 2020-11-14 (×2): 10 mg via INTRAVENOUS

## 2020-11-14 MED ORDER — CHLORHEXIDINE GLUCONATE CLOTH 2 % EX PADS: 6.0000 | MEDICATED_PAD | Freq: Once | CUTANEOUS | Status: DC

## 2020-11-14 MED ORDER — ACETAMINOPHEN 325 MG PO TABS: 325.0000 mg | ORAL_TABLET | ORAL | Status: DC | PRN

## 2020-11-14 MED ORDER — FENTANYL CITRATE (PF) 100 MCG/2ML IJ SOLN: 25.0000 ug | INTRAMUSCULAR | Status: DC | PRN

## 2020-11-14 MED ORDER — CEFAZOLIN IN SODIUM CHLORIDE 3-0.9 GM/100ML-% IV SOLN
3.0000 g | INTRAVENOUS | Status: AC
  Administered 2020-11-14: 2 g via INTRAVENOUS

## 2020-11-14 MED ORDER — VANCOMYCIN HCL 500 MG IV SOLR
INTRAVENOUS | Status: DC | PRN
  Administered 2020-11-14: 500 mg via TOPICAL

## 2020-11-14 MED ORDER — LIDOCAINE HCL (CARDIAC) PF 100 MG/5ML IV SOSY
PREFILLED_SYRINGE | INTRAVENOUS | Status: DC | PRN
  Administered 2020-11-14: 40 mg via INTRAVENOUS

## 2020-11-14 MED ORDER — FENTANYL CITRATE (PF) 100 MCG/2ML IJ SOLN
INTRAMUSCULAR | Status: AC
  Filled 2020-11-14: qty 2

## 2020-11-14 MED ORDER — MIDAZOLAM HCL 5 MG/5ML IJ SOLN
INTRAMUSCULAR | Status: DC | PRN
  Administered 2020-11-14: 2 mg via INTRAVENOUS

## 2020-11-14 MED ORDER — DEXAMETHASONE SODIUM PHOSPHATE 10 MG/ML IJ SOLN
INTRAMUSCULAR | Status: AC
  Filled 2020-11-14: qty 1

## 2020-11-14 MED ORDER — CEFAZOLIN SODIUM-DEXTROSE 2-4 GM/100ML-% IV SOLN
INTRAVENOUS | Status: AC
  Filled 2020-11-14: qty 100

## 2020-11-14 MED ORDER — MIDAZOLAM HCL 2 MG/2ML IJ SOLN
INTRAMUSCULAR | Status: AC
  Filled 2020-11-14: qty 2

## 2020-11-14 MED ORDER — LIDOCAINE HCL (PF) 2 % IJ SOLN
INTRAMUSCULAR | Status: AC
  Filled 2020-11-14: qty 5

## 2020-11-14 MED ORDER — SCOPOLAMINE 1 MG/3DAYS TD PT72
MEDICATED_PATCH | TRANSDERMAL | Status: AC
  Filled 2020-11-14: qty 1

## 2020-11-14 MED ORDER — IBUPROFEN 800 MG PO TABS: 800.0000 mg | ORAL_TABLET | Freq: Three times a day (TID) | ORAL | 0 refills | Status: DC | PRN

## 2020-11-14 MED ORDER — PROPOFOL 10 MG/ML IV BOLUS
INTRAVENOUS | Status: DC | PRN
  Administered 2020-11-14: 200 mg via INTRAVENOUS

## 2020-11-14 MED ORDER — PROPOFOL 10 MG/ML IV BOLUS
INTRAVENOUS | Status: AC
  Filled 2020-11-14: qty 40

## 2020-11-14 MED ORDER — EPHEDRINE 5 MG/ML INJ
INTRAVENOUS | Status: AC
  Filled 2020-11-14: qty 10

## 2020-11-14 MED ORDER — SODIUM CHLORIDE 0.9 % IV SOLN: INTRAVENOUS | Status: DC | PRN

## 2020-11-14 MED ORDER — BUPIVACAINE-EPINEPHRINE (PF) 0.25% -1:200000 IJ SOLN
INTRAMUSCULAR | Status: DC | PRN
  Administered 2020-11-14: 20 mL

## 2020-11-14 MED ORDER — DEXAMETHASONE SODIUM PHOSPHATE 10 MG/ML IJ SOLN
INTRAMUSCULAR | Status: DC | PRN
  Administered 2020-11-14: 5 mg via INTRAVENOUS

## 2020-11-14 MED ORDER — VANCOMYCIN HCL 500 MG IV SOLR
INTRAVENOUS | Status: AC
  Filled 2020-11-14: qty 500

## 2020-11-14 MED ORDER — HYDROCODONE-ACETAMINOPHEN 5-325 MG PO TABS: 1.0000 | ORAL_TABLET | Freq: Four times a day (QID) | ORAL | 0 refills | Status: DC | PRN

## 2020-11-14 MED ORDER — ONDANSETRON HCL 4 MG/2ML IJ SOLN
INTRAMUSCULAR | Status: DC | PRN
  Administered 2020-11-14: 4 mg via INTRAVENOUS

## 2020-11-14 MED ORDER — ACETAMINOPHEN 160 MG/5ML PO SOLN
325.0000 mg | ORAL | Status: DC | PRN
Start: 2020-11-14 — End: 2020-11-14

## 2020-11-14 MED ORDER — ACETAMINOPHEN 10 MG/ML IV SOLN: 1000.0000 mg | Freq: Once | INTRAVENOUS | Status: DC | PRN

## 2020-11-14 MED ORDER — OXYCODONE HCL 5 MG PO TABS: 5.0000 mg | ORAL_TABLET | Freq: Once | ORAL | Status: DC | PRN

## 2020-11-14 MED ORDER — LACTATED RINGERS IV SOLN: INTRAVENOUS | Status: DC

## 2020-11-14 MED ORDER — AMISULPRIDE (ANTIEMETIC) 5 MG/2ML IV SOLN: 10.0000 mg | Freq: Once | INTRAVENOUS | Status: DC | PRN

## 2020-11-14 MED ORDER — SCOPOLAMINE 1 MG/3DAYS TD PT72
1.0000 | MEDICATED_PATCH | TRANSDERMAL | Status: DC
  Administered 2020-11-14: 1.5 mg via TRANSDERMAL

## 2020-11-14 MED ORDER — FENTANYL CITRATE (PF) 100 MCG/2ML IJ SOLN
INTRAMUSCULAR | Status: DC | PRN
  Administered 2020-11-14: 25 ug via INTRAVENOUS

## 2020-11-14 MED ORDER — PROMETHAZINE HCL 25 MG/ML IJ SOLN
6.2500 mg | INTRAMUSCULAR | Status: DC | PRN
Start: 2020-11-14 — End: 2020-11-14

## 2020-11-14 MED ORDER — SODIUM CHLORIDE 0.9 % IV SOLN
INTRAVENOUS | Status: AC
  Filled 2020-11-14 (×2): qty 10

## 2020-11-14 MED ORDER — ONDANSETRON HCL 4 MG/2ML IJ SOLN
INTRAMUSCULAR | Status: AC
  Filled 2020-11-14: qty 2

## 2020-11-14 MED ORDER — OXYCODONE HCL 5 MG/5ML PO SOLN: 5.0000 mg | Freq: Once | ORAL | Status: DC | PRN

## 2020-11-14 SURGICAL SUPPLY — 53 items
ADH SKN CLS APL DERMABOND .7 (GAUZE/BANDAGES/DRESSINGS) ×1
APL PRP STRL LF DISP 70% ISPRP (MISCELLANEOUS) ×1
APPLIER CLIP 9.375 MED OPEN (MISCELLANEOUS) ×2
APR CLP MED 9.3 20 MLT OPN (MISCELLANEOUS) ×1
BINDER BREAST LRG (GAUZE/BANDAGES/DRESSINGS) ×2 IMPLANT
BINDER BREAST MEDIUM (GAUZE/BANDAGES/DRESSINGS) IMPLANT
BINDER BREAST XLRG (GAUZE/BANDAGES/DRESSINGS) IMPLANT
BINDER BREAST XXLRG (GAUZE/BANDAGES/DRESSINGS) IMPLANT
BLADE SURG 15 STRL LF DISP TIS (BLADE) ×1 IMPLANT
BLADE SURG 15 STRL SS (BLADE) ×2
CANISTER SUC SOCK COL 7IN (MISCELLANEOUS) IMPLANT
CANISTER SUCT 1200ML W/VALVE (MISCELLANEOUS) IMPLANT
CHLORAPREP W/TINT 26 (MISCELLANEOUS) ×2 IMPLANT
CLIP APPLIE 9.375 MED OPEN (MISCELLANEOUS) ×1 IMPLANT
COVER BACK TABLE 60X90IN (DRAPES) ×2 IMPLANT
COVER MAYO STAND STRL (DRAPES) ×2 IMPLANT
COVER PROBE W GEL 5X96 (DRAPES) ×2 IMPLANT
COVER WAND RF STERILE (DRAPES) IMPLANT
DECANTER SPIKE VIAL GLASS SM (MISCELLANEOUS) IMPLANT
DERMABOND ADVANCED (GAUZE/BANDAGES/DRESSINGS) ×1
DERMABOND ADVANCED .7 DNX12 (GAUZE/BANDAGES/DRESSINGS) ×1 IMPLANT
DRAPE LAPAROSCOPIC ABDOMINAL (DRAPES) IMPLANT
DRAPE LAPAROTOMY 100X72 PEDS (DRAPES) ×2 IMPLANT
DRAPE UTILITY XL STRL (DRAPES) ×2 IMPLANT
ELECT COATED BLADE 2.86 ST (ELECTRODE) ×2 IMPLANT
ELECT REM PT RETURN 9FT ADLT (ELECTROSURGICAL) ×2
ELECTRODE REM PT RTRN 9FT ADLT (ELECTROSURGICAL) ×1 IMPLANT
GLOVE SRG 8 PF TXTR STRL LF DI (GLOVE) ×1 IMPLANT
GLOVE SURG ENC MOIS LTX SZ6.5 (GLOVE) ×2 IMPLANT
GLOVE SURG ENC MOIS LTX SZ8.5 (GLOVE) ×2 IMPLANT
GLOVE SURG LTX SZ8 (GLOVE) ×2 IMPLANT
GLOVE SURG UNDER POLY LF SZ8 (GLOVE) ×2
GOWN STRL REUS W/ TWL LRG LVL3 (GOWN DISPOSABLE) ×2 IMPLANT
GOWN STRL REUS W/ TWL XL LVL3 (GOWN DISPOSABLE) ×1 IMPLANT
GOWN STRL REUS W/TWL LRG LVL3 (GOWN DISPOSABLE) ×4
GOWN STRL REUS W/TWL XL LVL3 (GOWN DISPOSABLE) ×2
HEMOSTAT ARISTA ABSORB 3G PWDR (HEMOSTASIS) IMPLANT
HEMOSTAT SNOW SURGICEL 2X4 (HEMOSTASIS) IMPLANT
KIT MARKER MARGIN INK (KITS) ×2 IMPLANT
NEEDLE HYPO 25X1 1.5 SAFETY (NEEDLE) ×2 IMPLANT
NS IRRIG 1000ML POUR BTL (IV SOLUTION) ×2 IMPLANT
PACK BASIN DAY SURGERY FS (CUSTOM PROCEDURE TRAY) ×2 IMPLANT
PENCIL SMOKE EVACUATOR (MISCELLANEOUS) ×2 IMPLANT
SLEEVE SCD COMPRESS KNEE MED (STOCKING) ×2 IMPLANT
SPONGE LAP 4X18 RFD (DISPOSABLE) ×2 IMPLANT
SUT MNCRL AB 4-0 PS2 18 (SUTURE) ×2 IMPLANT
SUT SILK 2 0 SH (SUTURE) IMPLANT
SUT VICRYL 3-0 CR8 SH (SUTURE) ×2 IMPLANT
SYR CONTROL 10ML LL (SYRINGE) ×2 IMPLANT
TOWEL GREEN STERILE FF (TOWEL DISPOSABLE) ×2 IMPLANT
TRAY FAXITRON CT DISP (TRAY / TRAY PROCEDURE) ×2 IMPLANT
TUBE CONNECTING 20X1/4 (TUBING) IMPLANT
YANKAUER SUCT BULB TIP NO VENT (SUCTIONS) IMPLANT

## 2020-11-14 NOTE — Op Note (Signed)
Preoperative diagnosis: Left breast DCIS upper outer quadrant  Postoperative diagnosis: Same  Procedure: Left breast seed localized lumpectomy  Surgeon: Erroll Luna, MD  Assistant: Dr. Mosie Lukes MD  Anesthesia: General with 0.25% Marcaine plain  EBL: 10 cc  Specimen: Left breast seed with left breast tissue with clip verified by Faxitron.  Additional margins were taken separately.  All margins were oriented with ink per protocol  Drains: None  Indications for procedure: The patient is a pleasant 64 year old female who has a small focus of left breast DCIS.  She was seen by medical and radiation oncology.  She opted for left breast lumpectomy for breast conserving surgery after reviewing all of her options of breast conserving surgery versus mastectomy with reconstruction.The procedure has been discussed with the patient. Alternatives to surgery have been discussed with the patient.  Risks of surgery include bleeding,  Infection,  Seroma formation, death,  and the need for further surgery.   The patient understands and wishes to proceed.   Description of procedure: The patient was met in the holding area and questions were answered.  Neoprobe used to identify the seed left breast upper outer quadrant.  She was then taken back to the operating.  She was placed supine the OR table.  After induction of general esthesia, left breast was prepped and draped in sterile fashion timeout performed.  Proper patient, site and procedure were verified.  Neoprobe used to identify the seed left breast.  Curvilinear incision was made along the superior border of the nipple areolar complex.  Dissection was carried down all tissue with seed and clip were excised with a grossly negative margin.  Additional margins were taken of the entire cavity and sent separately.  Faxitron image revealed seed and clip to be in the specimen.  Hemostasis was achieved.  Of note all tissue was oriented with ink and sent to  pathology for further assessment.  The cavity is irrigated made hemostatic.  Was closed with a deep layer 3-0 Vicryl and 4 Monocryl.  Local anesthetic was infiltrated throughout.  Dermabond was applied.  All counts were found to be correct.  The patient was awoke extubated taken to recovery in satisfactory condition.

## 2020-11-14 NOTE — Transfer of Care (Signed)
Immediate Anesthesia Transfer of Care Note  Patient: Nancy [name]  Procedure(s) Performed: LEFT BREAST LUMPECTOMY WITH RADIOACTIVE SEED LOCALIZATION (Left Breast)  Patient Location: PACU  Anesthesia Type:General  Level of Consciousness: awake  Airway & Oxygen Therapy: Patient Spontanous Breathing and Patient connected to face mask oxygen  Post-op Assessment: Report given to RN and Post -op Vital signs reviewed and stable  Post vital signs: Reviewed and stable  Last Vitals:  Vitals Value Taken Time  BP 150/69 11/14/20 0843  Temp    Pulse 94 11/14/20 0844  Resp 21 11/14/20 0844  SpO2 100 % 11/14/20 0844  Vitals shown include unvalidated device data.  Last Pain:  Vitals:   11/14/20 0657  TempSrc: Oral  PainSc: 0-No pain         Complications: No complications documented.

## 2020-11-14 NOTE — Anesthesia Postprocedure Evaluation (Signed)
Anesthesia Post Note  Patient: Nancy [name]  Procedure(s) Performed: LEFT BREAST LUMPECTOMY WITH RADIOACTIVE SEED LOCALIZATION (Left Breast)     Patient location during evaluation: PACU Anesthesia Type: General Level of consciousness: awake and alert Pain management: pain level controlled Vital Signs Assessment: post-procedure vital signs reviewed and stable Respiratory status: spontaneous breathing, nonlabored ventilation, respiratory function stable and patient connected to nasal cannula oxygen Cardiovascular status: blood pressure returned to baseline and stable Postop Assessment: no apparent nausea or vomiting Anesthetic complications: no   No complications documented.  Last Vitals:  Vitals:   11/14/20 0900 11/14/20 0922  BP: (!) 154/76 (!) 164/82  Pulse: 92 86  Resp: 18 17  Temp:  36.5 C  SpO2: 95% 98%    Last Pain:  Vitals:   11/14/20 0922  TempSrc:   PainSc: 0-No pain                 Effie Berkshire

## 2020-11-14 NOTE — Anesthesia Procedure Notes (Signed)
Procedure Name: LMA Insertion Date/Time: 11/14/2020 7:43 AM Performed by: Lavonia Dana, CRNA Pre-anesthesia Checklist: Patient identified, Emergency Drugs available, Suction available and Patient being monitored Patient Re-evaluated:Patient Re-evaluated prior to induction Oxygen Delivery Method: Circle system utilized Preoxygenation: Pre-oxygenation with 100% oxygen Induction Type: IV induction Ventilation: Mask ventilation without difficulty LMA: LMA inserted LMA Size: 4.0 Number of attempts: 1 Airway Equipment and Method: Bite block Placement Confirmation: positive ETCO2 Tube secured with: Tape Dental Injury: Teeth and Oropharynx as per pre-operative assessment

## 2020-11-14 NOTE — Discharge Instructions (Signed)
Ductal Carcinoma In Situ  Ductal carcinoma in situ is the presence of abnormal cells in the breast. It is the earliest form of breast cancer. The abnormal cells are located only in the tubes that carry milk to the nipple (milk ducts) and have not spread to other areas. What are the causes? The exact cause of ductal carcinoma in situ is not known. What increases the risk? The following factors increase the risk of developing ductal carcinoma in situ:  Being older than 66 years of age.  Being female.  Having a family history of breast cancer.  Current or past hormone use, such as: ? Using birth control. ? Taking hormone therapy after menopause.  Starting menopause after age 5.  A personal history of: ? Breast cancer. ? Dense breasts. ? Radiation treatments to the breasts or chest area. ? Having the BRCA1 and BRCA2 genes.  Drinking more than 1 alcoholic beverage a day.  Starting your menstrual periods before age 20.  Having never been pregnant or having your first child after age 12.  Having never breastfed.  Having an inactive (sedentary) lifestyle.  Exposure to the drug DES, which was given to pregnant women from the 1940s to the 1970s. What are the signs or symptoms? Ductal carcinoma in situ does not cause any symptoms. How is this diagnosed? Ductal carcinoma in situ is usually discovered during a routine X-ray of the breasts to check for abnormal changes (mammogram). To diagnose the condition, your health care provider may do an ultrasound and remove a tissue sample from your breast so it can be examined under a microscope (breast biopsy). Your health care provider may also remove one or more lymph nodes from under your arm to check if the abnormal cells have spread to your lymph nodes (sentinel lymph node biopsy). Lymph nodes are part of the body's disease-fighting (immune) system. They are located throughout the body. The lymph nodes under the arms are usually the  first place where abnormal cells spread. How is this treated? Ductal carcinoma in situ treatment may include:  A lumpectomy. This is surgery to remove the area of abnormal cells, along with a ring of normal tissue. This may also be called breast-conserving surgery.  Simple mastectomy. This is surgery to remove breast tissue, the nipple, and the circle of colored tissue around the nipple (areola). Sometimes, one or more lymph nodes from under the arm are also removed and tested for cancer cells.  Preventive mastectomy. This is the removal of both breasts. This is usually done only if you have a very high risk of developing breast cancer.  Radiation. This is the use of high-energy rays to kill cancer cells.  Medicines (hormone therapy) to keep the abnormal cells from spreading. Follow these instructions at home:  Take over-the-counter and prescription medicines only as told by your health care provider.  Eat a healthy diet. A healthy diet includes lots of fruits and vegetables, low-fat dairy products, lean meats, and fiber. ? Make sure half your plate is filled with fruits or vegetables. ? Choose high-fiber foods such as whole-grain breads and cereals.  Limit alcohol intake to no more than 1 drink a day for women (no drinks if you are pregnant) and 2 drinks a day for men. One drink equals 12 oz of beer, 5 oz of wine, or 1 oz of hard liquor.  Do not use any products that contain nicotine or tobacco, such as cigarettes and e-cigarettes. If you need help quitting, ask your  health care provider.  Keep all follow-up visits as told by your health care provider. This is important. Where to find more information  American Cancer Society: www.cancer.[place]: www.cancer.gov Contact a health care provider if:  You have a fever.  You notice a new lump in either breast or under your arm.  You have any symptoms or changes that concern you. Get help right away if:  You  have chest pain or trouble breathing. Summary  Ductal carcinoma in situ is the presence of abnormal cells in the breast. It is the earliest form of breast cancer.  The exact cause of ductal carcinoma in situ is not known. The risk increases with age and with current or past hormone use.  To diagnose the condition, your health care provider will remove a tissue sample from your breast so it can be examined under a microscope (breast biopsy). This information is not intended to replace advice given to you by your health care provider. Make sure you discuss any questions you have with your health care provider. Document Revised: 05/13/2017 Document Reviewed: 03/08/2017 Elsevier Patient Education  2021 [place] Office Phone Number [phone]  BREAST BIOPSY/ PARTIAL MASTECTOMY: POST OP INSTRUCTIONS  Always review your discharge instruction sheet given to you by the facility where your surgery was performed.  IF YOU HAVE DISABILITY OR FAMILY LEAVE FORMS, YOU MUST BRING THEM TO THE OFFICE FOR PROCESSING.  DO NOT GIVE THEM TO YOUR DOCTOR.  1. A prescription for pain medication may be given to you upon discharge.  Take your pain medication as prescribed, if needed.  If narcotic pain medicine is not needed, then you may take acetaminophen (Tylenol) or ibuprofen (Advil) as needed. 2. Take your usually prescribed medications unless otherwise directed 3. If you need a refill on your pain medication, please contact your pharmacy.  They will contact our office to request authorization.  Prescriptions will not be filled after 5pm or on week-ends. 4. You should eat very light the first 24 hours after surgery, such as soup, crackers, pudding, etc.  Resume your normal diet the day after surgery. 5. Most patients will experience some swelling and bruising in the breast.  Ice packs and a good support bra will help.  Swelling and bruising can take several days to resolve.   6. It is common to experience some constipation if taking pain medication after surgery.  Increasing fluid intake and taking a stool softener will usually help or prevent this problem from occurring.  A mild laxative (Milk of Magnesia or Miralax) should be taken according to package directions if there are no bowel movements after 48 hours. 7. Unless discharge instructions indicate otherwise, you may remove your bandages 24-48 hours after surgery, and you may shower at that time.  You may have steri-strips (small skin tapes) in place directly over the incision.  These strips should be left on the skin for 7-10 days.  If your surgeon used skin glue on the incision, you may shower in 24 hours.  The glue will flake off over the next 2-3 weeks.  Any sutures or staples will be removed at the office during your follow-up visit. 8. ACTIVITIES:  You may resume regular daily activities (gradually increasing) beginning the next day.  Wearing a good support bra or sports bra minimizes pain and swelling.  You may have sexual intercourse when it is comfortable. a. You may drive when you no longer are taking prescription pain medication,  you can comfortably wear a seatbelt, and you can safely maneuver your car and apply brakes. b. RETURN TO WORK:  ______________________________________________________________________________________ 9. You should see your doctor in the office for a follow-up appointment approximately two weeks after your surgery.  Your doctor's nurse will typically make your follow-up appointment when she calls you with your pathology report.  Expect your pathology report 2-3 business days after your surgery.  You may call to check if you do not hear from [place] after three days. 10. OTHER INSTRUCTIONS: _______________________________________________________________________________________________  _____________________________________________________________________________________________________________________________________ _____________________________________________________________________________________________________________________________________ _____________________________________________________________________________________________________________________________________  WHEN TO CALL YOUR DOCTOR: 1. Fever over 101.0 2. Nausea and/or vomiting. 3. Extreme swelling or bruising. 4. Continued bleeding from incision. 5. Increased pain, redness, or drainage from the incision.  The clinic staff is available to answer your questions during regular business hours.  Please don't hesitate to call and ask to speak to one of the nurses for clinical concerns.  If you have a medical emergency, go to the nearest emergency room or call 911.  A surgeon from [facility] is always on call at the hospital.  For further questions, please visit [email or web address]     Post Anesthesia Home Care Instructions  Activity: Get plenty of rest for the remainder of the day. A responsible individual must stay with you for 24 hours following the procedure.  For the next 24 hours, DO NOT: -Drive a car -Paediatric nurse -Drink alcoholic beverages -Take any medication unless instructed by your physician -Make any legal decisions or sign important papers.  Meals: Start with liquid foods such as gelatin or soup. Progress to regular foods as tolerated. Avoid greasy, spicy, heavy foods. If nausea and/or vomiting occur, drink only clear liquids until the nausea and/or vomiting subsides. Call your physician if vomiting continues.  Special Instructions/Symptoms: Your throat may feel dry or sore from the anesthesia or the breathing tube placed in your throat during surgery. If this causes discomfort, gargle with warm salt water. The discomfort should disappear within 24  hours.  If you had a scopolamine patch placed behind your ear for the management of post- operative nausea and/or vomiting:  1. The medication in the patch is effective for 72 hours, after which it should be removed.  Wrap patch in a tissue and discard in the trash. Wash hands thoroughly with soap and water. 2. You may remove the patch earlier than 72 hours if you experience unpleasant side effects which may include dry mouth, dizziness or visual disturbances. 3. Avoid touching the patch. Wash your hands with soap and water after contact with the patch.

## 2020-11-14 NOTE — Interval H&P Note (Signed)
History and Physical Interval Note:  11/14/2020 7:20 AM  Nancy [name]  has presented today for surgery, with the diagnosis of LEFT BREAST DCIS.  The various methods of treatment have been discussed with the patient and family. After consideration of risks, benefits and other options for treatment, the patient has consented to  Procedure(s): LEFT BREAST LUMPECTOMY WITH RADIOACTIVE SEED LOCALIZATION (Left) as a surgical intervention.  The patient's history has been reviewed, patient examined, no change in status, stable for surgery.  I have reviewed the patient's chart and labs.  Questions were answered to the patient's satisfaction.     [place]

## 2020-11-15 ENCOUNTER — Other Ambulatory Visit: Payer: Self-pay | Admitting: Primary Care

## 2020-11-15 DIAGNOSIS — F411 Generalized anxiety disorder: Secondary | ICD-10-CM

## 2020-11-17 LAB — SURGICAL PATHOLOGY

## 2020-11-18 ENCOUNTER — Encounter (HOSPITAL_BASED_OUTPATIENT_CLINIC_OR_DEPARTMENT_OTHER): Payer: Self-pay | Admitting: Surgery

## 2020-11-19 ENCOUNTER — Encounter: Payer: Self-pay | Admitting: *Deleted

## 2020-11-19 DIAGNOSIS — D0512 Intraductal carcinoma in situ of left breast: Secondary | ICD-10-CM

## 2020-11-24 ENCOUNTER — Encounter: Payer: Self-pay | Admitting: *Deleted

## 2020-11-27 ENCOUNTER — Encounter: Payer: PPO | Admitting: Gastroenterology

## 2020-12-23 ENCOUNTER — Encounter: Payer: Self-pay | Admitting: *Deleted

## 2020-12-23 ENCOUNTER — Telehealth: Payer: Self-pay | Admitting: Hematology and Oncology

## 2020-12-23 NOTE — Telephone Encounter (Signed)
Contacted patient about scheduling her with gudena per 07/12 sch msg. Patient said she did not want to come in.

## 2020-12-25 ENCOUNTER — Ambulatory Visit: Payer: PPO

## 2020-12-25 ENCOUNTER — Ambulatory Visit: Payer: PPO | Admitting: Radiation Oncology

## 2020-12-25 ENCOUNTER — Telehealth: Payer: Self-pay | Admitting: *Deleted

## 2020-12-25 ENCOUNTER — Encounter: Payer: Self-pay | Admitting: *Deleted

## 2020-12-25 NOTE — Telephone Encounter (Signed)
Spoke with patient regarding an appointment with Dr. Lindi Adie to discuss anti-estrogen. Patient states she got several 2nd opinions and she has decided not to do any treatment at this time. Offered to see Dr. Lindi Adie for follow up but she declined.  Encouraged her to call should she need anything in the future. Patient verbalized understanding.

## 2021-01-15 ENCOUNTER — Other Ambulatory Visit: Payer: Self-pay | Admitting: Primary Care

## 2021-01-15 DIAGNOSIS — F411 Generalized anxiety disorder: Secondary | ICD-10-CM

## 2021-01-15 NOTE — Telephone Encounter (Signed)
During our last discussion the patient wanted to reduce her citalopram to 10 mg for anxiety, it looks like she's done this. How is she doing on the reduced dose?  Dose she want for me to send the 10 mg tablet of citalopram rather than the 20 mg we received through refill request?

## 2021-01-19 NOTE — Telephone Encounter (Signed)
Called patient has been doing well on the '10mg'$ . She would like called in for '10mg'$ . I have changed as requested for #90 with 1 rf. No further action needed at this time.

## 2021-01-19 NOTE — Telephone Encounter (Signed)
Left message to return call to our office.  

## 2021-01-28 ENCOUNTER — Other Ambulatory Visit: Payer: PPO

## 2021-03-27 ENCOUNTER — Telehealth: Payer: Self-pay

## 2021-03-27 ENCOUNTER — Other Ambulatory Visit: Payer: Self-pay

## 2021-03-27 ENCOUNTER — Ambulatory Visit (INDEPENDENT_AMBULATORY_CARE_PROVIDER_SITE_OTHER): Payer: PPO | Admitting: Family Medicine

## 2021-03-27 ENCOUNTER — Encounter: Payer: Self-pay | Admitting: Family Medicine

## 2021-03-27 DIAGNOSIS — R42 Dizziness and giddiness: Secondary | ICD-10-CM

## 2021-03-27 DIAGNOSIS — R202 Paresthesia of skin: Secondary | ICD-10-CM

## 2021-03-27 NOTE — Patient Instructions (Signed)
It is possible that the tingling is from your neck.  Likely BPV.  Would try the home exercises if needed.  Update [place] as needed.  Take care.  Glad to see you.

## 2021-03-27 NOTE — Telephone Encounter (Signed)
See OV note.  Thanks.  

## 2021-03-27 NOTE — Telephone Encounter (Signed)
Pt already has appt with Dr Damita Dunnings 03/27/21 at 12 noon. Sending note to Dr Damita Dunnings and Janett Billow CMA.

## 2021-03-27 NOTE — Progress Notes (Signed)
This visit occurred during the SARS-CoV-2 public health emergency.  Safety protocols were in place, including screening questions prior to the visit, additional usage of staff PPE, and extensive cleaning of exam room while observing appropriate contact time as indicated for disinfecting solutions.  Fire ant stings on R foot Monday AM (03/23/21).   Vertigo.  She had room spinning.  Started a few days ago.    03/24/21 woke up with vertigo, better after sitting up on the side of the bed.  Had similar sx a few months ago.  No sx 03/25/21 but had room spinning on 03/26/21.  No sx sitting still.  Walked this AM w/o troubles when looking straight ahead.  Not lightheaded.  Vertigo was quick onset and quickly resolving.    She had h/o HTN but was able to get off meds previously.  She had L scalp and ear tingling previously, that was attributed to anxiety at the time.  She will occ feel like she is wearing a hat even when she isn't- that is on B sides of the scalp  This is longstanding but intermittent.    Prev imaging d/w pt.    1. 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 2. 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 3. Remote C5-C7 fusion.  Meds, vitals, and allergies reviewed.   ROS: Per HPI unless specifically indicated in ROS section   GEN: nad, alert and oriented HEENT: ncat, TM wnl.   NECK: supple w/o LA CV: rrr. PULM: ctab, no inc wob ABD: soft, +bs EXT: no edema SKIN: no acute rash but she does have some resolving fire ant sting sites on her right foot.  No spreading cellulitis. (This should not need treatment at this point, discussed) CN 2-12 wnl B, S/S/DTR wnl x4 DHP testing minimally positive on sitting up and looking from R to midline.  Minimal L scalp tingling locally but normal sensation on the face B  35 minutes were devoted to patient care in this encounter (this includes time spent reviewing the  patient's file/history, interviewing and examining the patient, counseling/reviewing plan with patient).

## 2021-03-27 NOTE — Telephone Encounter (Signed)
[facility] - Client TELEPHONE ADVICE RECORD [facility] Patient Name: Nancy [name] Gender: Female DOB: Nov 19, 1955 Age: 65 Y 3 M 22 D Return Phone Number: [phone] (Primary) Address[place]  [address] Client [facility] - Client Client [facility] Day Physician [name] - NP Contact Type Call Who Is Calling Patient / Member / Family / Caregiver Call Type Triage / Clinical Relationship To Patient Self Return Phone Number [phone] (Primary) Chief Complaint Dizziness Reason for Call Symptomatic / Request for Health Information Initial Comment Caller states they've dizziness. Translation No Nurse Assessment Nurse: D'Heur Lucia Gaskins, RN, [place] Date/Time (Eastern Time): 03/27/2021 9:51:22 AM Confirm and document reason for call. If symptomatic, describe symptoms. ---Caller states she has been having dizziness that began on Tuesday. It is vertigo she is experiencing. She's had it every day since it started. She has pain in left side ribs which hurts when mashing on it-MD told her it is likely costochondritis. Present for over a year. Does the patient have any new or worsening symptoms? ---Yes Will a triage be completed? ---Yes Related visit to physician within the last 2 weeks? ---No Does the PT have any chronic conditions? (i.e. diabetes, asthma, this includes High risk factors for pregnancy, etc.) ---Yes List chronic conditions. 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 ---No Guidelines Guideline Title Affirmed Question Affirmed Notes Nurse Date/Time (Eastern Time) Dizziness - Vertigo [1] MODERATE dizziness (e.g., vertigo; feels very unsteady, interferes with normal activities) AND [2] has NOT been evaluated by physician for this [place], RN,  [place] 03/27/2021 9:57:29 AM PLEASE NOTE: All timestamps contained within this report are represented as [place] Standard Time. CONFIDENTIALTY NOTICE: This fax transmission is intended only for the addressee. It contains information that is legally privileged, confidential or otherwise protected from use or disclosure. If you are not the intended recipient, you are strictly prohibited from reviewing, disclosing, copying using or disseminating any of this information or taking any action in reliance on or regarding this information. If you have received this fax in error, please notify us immediately by telephone so that we can arrange for its return to [place]. Phone: [phone], Toll-Free: [phone], Fax: [phone] Page: 2 of 2 Call Id: [id number] [place]. Time Eilene Ghazi Time) Disposition Final User 03/27/2021 9:32:47 AM Attempt made - message left D'Heur Lucia Gaskins, RN, Vincente Liberty 03/27/2021 10:03:10 AM See PCP within 24 Hours Yes D'Heur Lucia Gaskins, RN, Vincente Liberty Caller Disagree/Comply Comply Caller Understands Yes PreDisposition Call Doctor Care Advice Given Per Guideline SEE PCP WITHIN 24 HOURS: * IF OFFICE WILL BE OPEN: You need to be examined within the next 24 hours. Call your doctor (or NP/PA) when the office opens and make an appointment. BRING MEDICINES: * Please bring a list of your current medicines when you go to see the doctor. CALL BACK IF: * Severe headache occurs * Weakness develops in an arm or leg * Unable to walk without falling * You become worse CARE ADVICE given per Dizziness - Vertigo (Adult) guideline. Comments User: Baruch Goldmann Date/Time Eilene Ghazi Time): 03/27/2021 9:30:13 AM Caller transferred from the office. User: Vincente Liberty, D'Heur Lucia Gaskins, RN Date/Time Eilene Ghazi Time): 03/27/2021 9:54:24 AM Caller has had tingling for past year and a half in her head. Referrals REFERRED TO PCP OFFIC

## 2021-03-29 DIAGNOSIS — R42 Dizziness and giddiness: Secondary | ICD-10-CM | POA: Insufficient documentation

## 2021-03-29 DIAGNOSIS — R202 Paresthesia of skin: Secondary | ICD-10-CM | POA: Insufficient documentation

## 2021-03-29 NOTE — Assessment & Plan Note (Signed)
Likely benign positional vertigo that is already improving.  Anatomy discussed with patient.  She can work on home exercises as needed and update [place] if she has persistent symptoms.  It appears that the paresthesias she noted is a completely separate issue and does not involve the face.

## 2021-03-29 NOTE — Assessment & Plan Note (Signed)
She has scalp but not facial symptoms. It is possible that the tingling is from degenerative changes in her neck.  Anatomy discussed with patient.  Still okay for outpatient follow-up.  She can update [place] if her neck pain is worse or if she has no symptoms.

## 2021-04-24 ENCOUNTER — Other Ambulatory Visit: Payer: Self-pay

## 2021-04-24 ENCOUNTER — Ambulatory Visit: Payer: PPO | Admitting: Cardiovascular Disease

## 2021-04-24 ENCOUNTER — Encounter: Payer: Self-pay | Admitting: Cardiovascular Disease

## 2021-04-24 VITALS — BP 130/74 | HR 91 | Ht 63.0 in | Wt 148.0 lb

## 2021-04-24 DIAGNOSIS — M940 Chondrocostal junction syndrome [Tietze]: Secondary | ICD-10-CM

## 2021-04-24 DIAGNOSIS — E785 Hyperlipidemia, unspecified: Secondary | ICD-10-CM

## 2021-04-24 DIAGNOSIS — Z79899 Other long term (current) drug therapy: Secondary | ICD-10-CM | POA: Diagnosis not present

## 2021-04-24 NOTE — Progress Notes (Signed)
Cardiology Office Note:    Date:  04/24/2021   ID:  Nancy [name], DOB 06/27/1955, MRN [id number]  PCP:  Pleas Koch, NP  Cardiologist:  Amando Ishikawa  Electrophysiologist:  None   Referring MD: Pleas Koch, NP   Problem List 1. Anxiety:  2.  Hyperlipidemia  3.  Hypertension   Chief Complaint  Patient presents with   Hypertension   Hyperlipidemia    History of Present Illness:    Nancy [name] is a 65 y.o. female with a hx of anxiety ,   ? HTN. We were asked to see [name], NP for further evaluation of an episode of face tingling and HTN.  Mother of Nancy [name] ( has premature CAD )    Has had some tingling on the side of her face.   Became very anxious Called EMS,  BP was very elevated.  She tried amlopidine .  She had recurrent episodes of marked HTN and face tingling .  BP was 220   [name] changed the amlodipine to Lisinopril 10 mg BID  BP continued to spike  Was started on metoprolol   Admits that she does not watch her salt .  Eats fast foods frequently for lunch  Dinner  Typically fried or salty foods.    September 06, 2019:  Nancy [name] is seen for follow-up visit for hyperlipidemia and hypertension. We started her on chlorthalidone during her last visit.    Coronary calcium score of 243. This was 91st percentile for age and sex matched control.  We saw her on Crestor at that time.  Has been on a low salt diet.  Her primary MD cut her lisinopril and metoprolol   December 07, 2019:  Nancy [name] is seen today for follow-up of her hyperlipidemia and hypertension. Feeling well,  Doing great.  She is now on Repatha  BP and HR are very well controlled on metoprolol 12,5 BID Her anxiety is much better controlled   April 24, 2021: Nancy [name] is seen today for follow-up of her hyperlipidemia and hypertension. Still having some chest wall pain .    Has not been getting regular exercise  Only sporadic exercise   Has developed alpha gal -  found a  wood tick on her  Would develop a rash anytime she ate beef.  tested  positive for alpha gal Also allergic to milk products    Past Medical History:  Diagnosis Date   Allergy to alpha-gal    Breast cancer ([place]) 10/23/2020   Chronic neck pain 04/11/2018   Eczema    Essential hypertension 07/20/2019   Fatigue    GAD (generalized anxiety disorder) 06/26/2018   Hyperlipidemia    Lichen sclerosus    Overweight (BMI 25.0-29.9) 01/09/2018   Recurrent upper respiratory infection (URI)    Routine general medical examination at a health care facility    Urinary frequency 06/06/2019   Urticaria    Vaginal itching 06/06/2019    Past Surgical History:  Procedure Laterality Date   ANTERIOR CERVICAL DECOMP/DISCECTOMY FUSION     BREAST LUMPECTOMY WITH RADIOACTIVE SEED LOCALIZATION Left 11/14/2020   Procedure: LEFT BREAST LUMPECTOMY WITH RADIOACTIVE SEED LOCALIZATION;  Surgeon: Erroll Luna, MD;  Location: [facility];  Service: General;  Laterality: Left;   COLONOSCOPY WITH PROPOFOL N/A 10/06/2020   Procedure: COLONOSCOPY WITH PROPOFOL;  Surgeon: Virgel Manifold, MD;  Location: [facility];  Service: Endoscopy;  Laterality: N/A;   PARTIAL HYSTERECTOMY  TUBAL LIGATION      Current Medications: Current Meds  Medication Sig   Calcium-Phosphorus-Vitamin D (CALCIUM/D3 ADULT GUMMIES PO) Take by mouth.   citalopram (CELEXA) 10 MG tablet Take 1 tablet (10 mg total) by mouth daily. TAKE 1 TABLET (20 MG TOTAL) BY MOUTH DAILY. FOR ANXIETY.   clobetasol cream (TEMOVATE) 7.74 % Apply 1 application topically as needed.   Cyanocobalamin (B-12) 1000 MCG CAPS Take 1 tablet by mouth daily.   diphenhydrAMINE HCl ([facility] ALLERGY PO) Take by mouth as needed.   EPINEPHrine 0.3 mg/0.3 mL IJ SOAJ injection Inject 0.3 mg into the muscle as needed for anaphylaxis.     Allergies:   Alpha-gal, Macrobid [nitrofurantoin], Milk protein, Other, Shellfish allergy, Statins, and Zetia [ezetimibe]    Social History   Socioeconomic History   Marital status: Single    Spouse name: Not on file   Number of children: 2   Years of education: Not on file   Highest education level: Not on file  Occupational History   Not on file  Tobacco Use   Smoking status: Never   Smokeless tobacco: Never  Vaping Use   Vaping Use: Never used  Substance and Sexual Activity   Alcohol use: Not Currently    Alcohol/week: 0.0 standard drinks   Drug use: No   Sexual activity: Not on file  Other Topics Concern   Not on file  Social History Narrative   Divorced.  G3P2.   Regular exercise-yes   Social Determinants of Health   Financial Resource Strain: Not on file  Food Insecurity: Not on file  Transportation Needs: Not on file  Physical Activity: Not on file  Stress: Not on file  Social Connections: Not on file     Family History: The patient's family history includes Diabetes in her father; Hydrocephalus in her mother; Hypertension in her father; Neuropathy in her mother; Thyroid cancer in her cousin; Thyroid disease in her maternal aunt.  ROS:   Please see the history of present illness.     All other systems reviewed and are negative.  EKGs/Labs/Other Studies Reviewed:    The following studies were reviewed today:      Recent Labs: 11/14/2020: ALT 14; BUN 9; Creatinine, Ser 0.68; Hemoglobin 13.8; Platelets 229; Potassium 3.5; Sodium 138  Recent Lipid Panel    Component Value Date/Time   CHOL 185 08/06/2020 0937   CHOL 280 (H) 04/22/2020 1029   TRIG 123.0 08/06/2020 0937   HDL 71.60 08/06/2020 0937   HDL 64 04/22/2020 1029   CHOLHDL 3 08/06/2020 0937   VLDL 24.6 08/06/2020 0937   LDLCALC 89 08/06/2020 0937   LDLCALC 191 (H) 04/22/2020 1029   LDLDIRECT 197.5 11/02/2011 0819    Physical Exam:    Physical Exam: Blood pressure 130/74, pulse 91, height 5\' 3"  (1.6 m), weight 148 lb (67.1 kg), SpO2 98 %.  GEN:  Well nourished, well developed in no acute distress HEENT:  Normal NECK: No JVD; No carotid bruits LYMPHATICS: No lymphadenopathy CARDIAC: RRR , no murmurs, rubs, gallops RESPIRATORY:  Clear to auscultation without rales, wheezing or rhonchi  ABDOMEN: Soft, non-tender, non-distended MUSCULOSKELETAL:  No edema; No deformity  SKIN: Warm and dry NEUROLOGIC:  Alert and oriented x 3  ECG: April 24, 2021: Normal sinus rhythm at 87.  She has some respiratory variability.  She has nonspecific ST and T wave abnormalities.  ASSESSMENT:    1. Hyperlipidemia, unspecified hyperlipidemia type   2. Costochondritis   3. Medication management  PLAN:       1.   Hyperlipidemia:    She has a history of hyperlipidemia.  We will check labs today.  She was previously on Praluent but stopped it when she developed alpha gal.  She does not recall actually ever having a reaction to the injection. Consider restartinig Praluent  Recheck lipids   2.  Hypertension:     Blood pressure is well controlled.   3.  Anxiety:     Medication Adjustments/Labs and Tests Ordered: Current medicines are reviewed at length with the patient today.  Concerns regarding medicines are outlined above.  Orders Placed This Encounter  Procedures   ALT   Basic metabolic panel   Lipid panel   EKG 12-Lead    No orders of the defined types were placed in this encounter.    Patient Instructions  Medication Instructions:  Your physician recommends that you continue on your current medications as directed. Please refer to the Current Medication list given to you today.  *If you need a refill on your cardiac medications before your next appointment, please call your pharmacy*   Lab Work: Lipids, ALY and BMET today  If you have labs (blood work) drawn today and your tests are completely normal, you will receive your results only by: [place] (if you have MyChart) OR A paper copy in the mail If you have any lab test that is abnormal or we need to change your  treatment, we will call you to review the results.   Testing/Procedures: None ordered.    Follow-Up: At [facility], you and your health needs are our priority.  As part of our continuing mission to provide you with exceptional heart care, we have created designated Provider Care Teams.  These Care Teams include your primary Cardiologist (physician) and Advanced Practice Providers (APPs -  Physician Assistants and Nurse Practitioners) who all work together to provide you with the care you need, when you need it.  We recommend signing up for the patient portal called "MyChart".  Sign up information is provided on this After Visit Summary.  MyChart is used to connect with patients for Virtual Visits (Telemedicine).  Patients are able to view lab/test results, encounter notes, upcoming appointments, etc.  Non-urgent messages can be sent to your provider as well.   To learn more about what you can do with MyChart, go to [email or web address].    Your next appointment:   1 year with Dr Acie Fredrickson   Signed, Mertie Moores, MD  04/24/2021 4:03 PM    [place]

## 2021-04-24 NOTE — Patient Instructions (Signed)
Medication Instructions:  Your physician recommends that you continue on your current medications as directed. Please refer to the Current Medication list given to you today.  *If you need a refill on your cardiac medications before your next appointment, please call your pharmacy*   Lab Work: Lipids, ALY and BMET today  If you have labs (blood work) drawn today and your tests are completely normal, you will receive your results only by: [place] (if you have MyChart) OR A paper copy in the mail If you have any lab test that is abnormal or we need to change your treatment, we will call you to review the results.   Testing/Procedures: None ordered.    Follow-Up: At [facility], you and your health needs are our priority.  As part of our continuing mission to provide you with exceptional heart care, we have created designated Provider Care Teams.  These Care Teams include your primary Cardiologist (physician) and Advanced Practice Providers (APPs -  Physician Assistants and Nurse Practitioners) who all work together to provide you with the care you need, when you need it.  We recommend signing up for the patient portal called "MyChart".  Sign up information is provided on this After Visit Summary.  MyChart is used to connect with patients for Virtual Visits (Telemedicine).  Patients are able to view lab/test results, encounter notes, upcoming appointments, etc.  Non-urgent messages can be sent to your provider as well.   To learn more about what you can do with MyChart, go to [email or web address].    Your next appointment:   1 year with Dr Acie Fredrickson

## 2021-04-25 LAB — LIPID PANEL
Chol/HDL Ratio: 4.3 ratio (ref 0.0–4.4)
Cholesterol, Total: 271 mg/dL — ABNORMAL HIGH (ref 100–199)
HDL: 63 mg/dL (ref >39)
LDL Chol Calc (NIH): 163 mg/dL — ABNORMAL HIGH (ref 0–99)
Triglycerides: 244 mg/dL — ABNORMAL HIGH (ref 0–149)
VLDL Cholesterol Cal: 45 mg/dL — ABNORMAL HIGH (ref 5–40)

## 2021-04-25 LAB — BASIC METABOLIC PANEL
BUN/Creatinine Ratio: 16 (ref 12–28)
BUN: 14 mg/dL (ref 8–27)
CO2: 26 mmol/L (ref 20–29)
Calcium: 9.4 mg/dL (ref 8.7–10.3)
Chloride: 97 mmol/L (ref 96–106)
Creatinine, Ser: 0.88 mg/dL (ref 0.57–1.00)
Glucose: 103 mg/dL — ABNORMAL HIGH (ref 70–99)
Potassium: 3.9 mmol/L (ref 3.5–5.2)
Sodium: 140 mmol/L (ref 134–144)
eGFR: 72 mL/min/{1.73_m2} (ref >59)

## 2021-04-25 LAB — ALT: ALT: 12 IU/L (ref 0–32)

## 2021-04-27 ENCOUNTER — Telehealth: Payer: Self-pay

## 2021-04-27 DIAGNOSIS — E782 Mixed hyperlipidemia: Secondary | ICD-10-CM

## 2021-04-27 NOTE — Telephone Encounter (Signed)
-----   Message from Thayer Headings, MD sent at 04/26/2021  8:25 PM EST ----- Cholesterol levels are very elevated  I would like for her to retry taking the [place]. She stopped it when she developed Alpha Gal. Have her start repatha and watch for any side effects similar to the Alpha Gal  If she does not want to try ( or if she develops symptoms) please have her come back to lipid clinic for consideration of Inclisiran .

## 2021-04-27 NOTE — Telephone Encounter (Signed)
Spoke with Nancy [name] and advised of Dr Elmarie Shiley recommendation to restart Repatha due to elevated cholesterol levels.  Nancy [name] states the Repatha did not bring her lipids down enough and Praluent was started but she cannot take this now due to Alpha Gal.  She does not want an injectable drug.  She would prefer an oral medication.  She would be willing to to try a medication that her friend takes that begins with a "C" that is taken twice daily.  Nancy [name] advised will forward to Dr Acie Fredrickson and our PharmD team for further recommendations.  Nancy [name] verbalizes understanding.

## 2021-04-28 DIAGNOSIS — E782 Mixed hyperlipidemia: Secondary | ICD-10-CM

## 2021-04-28 NOTE — Telephone Encounter (Signed)
Referral placed - Attempted phone call to pt and left voicemail message to contact office at [phone].

## 2021-04-28 NOTE — Telephone Encounter (Signed)
She did not know the name of medication, only that it is taken twice daily.

## 2021-04-28 NOTE — Telephone Encounter (Signed)
Does she mean Colesevelam?  That is 3 tablets (1875mg ) two times a day

## 2021-04-28 NOTE — Telephone Encounter (Signed)
Nancy [name] has a modest reduction in LDL but has not been proven to reduce risk of major adverse cardiac events such as MI, such as statins or Repatha/Praluent.  Do not recommend.

## 2021-05-04 NOTE — Telephone Encounter (Signed)
Called pt advised that MD is ok with FLP and lipid clinic visit.  Appointments for both made pt verbalizes understanding and all questions answered.

## 2021-05-05 ENCOUNTER — Other Ambulatory Visit: Payer: Self-pay

## 2021-05-05 ENCOUNTER — Other Ambulatory Visit: Payer: PPO | Admitting: *Deleted

## 2021-05-05 DIAGNOSIS — E782 Mixed hyperlipidemia: Secondary | ICD-10-CM | POA: Diagnosis not present

## 2021-05-05 LAB — LIPID PANEL
Chol/HDL Ratio: 4 ratio (ref 0.0–4.4)
Cholesterol, Total: 267 mg/dL — ABNORMAL HIGH (ref 100–199)
HDL: 66 mg/dL (ref >39)
LDL Chol Calc (NIH): 183 mg/dL — ABNORMAL HIGH (ref 0–99)
Triglycerides: 106 mg/dL (ref 0–149)
VLDL Cholesterol Cal: 18 mg/dL (ref 5–40)

## 2021-05-19 DIAGNOSIS — S61211A Laceration without foreign body of left index finger without damage to nail, initial encounter: Secondary | ICD-10-CM | POA: Diagnosis not present

## 2021-05-21 ENCOUNTER — Telehealth: Payer: Self-pay | Admitting: Primary Care

## 2021-05-21 DIAGNOSIS — L509 Urticaria, unspecified: Secondary | ICD-10-CM

## 2021-05-21 DIAGNOSIS — F411 Generalized anxiety disorder: Secondary | ICD-10-CM

## 2021-05-21 MED ORDER — EPINEPHRINE 0.3 MG/0.3ML IJ SOAJ: 0.3000 mg | INTRAMUSCULAR | 0 refills | Status: DC | PRN

## 2021-05-21 NOTE — Telephone Encounter (Signed)
Please notify patient that she should have another refill on her Celexa and to call the pharmacy for that.  Will refill Epi Pen.  Needs to be scheduled for CPE for late February 2023

## 2021-05-21 NOTE — Telephone Encounter (Signed)
Last seen by you for CPE 08/06/20 Given 6 months of celexa on 01/19/21 And one epinephrine pen on 05/26/2020

## 2021-05-21 NOTE — Telephone Encounter (Signed)
Left message to return call to our office.  

## 2021-05-21 NOTE — Telephone Encounter (Signed)
Pt called in stated she would like to up the dosage on RXcitalopram (CELEXA) 10 MG tablet to 20 MG  . Please advise    Encourage patient to contact the pharmacy for refills or they can request refills through [place]:  Please schedule appointment if longer than 1 year  NEXT APPOINTMENT DATE:  MEDICATION:EPINEPHrine 0.3 mg/0.3 mL IJ SOAJ injection   Is the patient out of medication?   PHARMACY:[id number] [place], [place] [address]    Let patient know to contact pharmacy at the end of the day to make sure medication is ready.  Please notify patient to allow 48-72 hours to process  CLINICAL FILLS OUT ALL BELOW:   LAST REFILL:  QTY:  REFILL DATE:    OTHER COMMENTS:    Okay for refill?  Please advise

## 2021-05-22 NOTE — Telephone Encounter (Signed)
Left message to return call to our office.  

## 2021-05-22 NOTE — Telephone Encounter (Signed)
Pt returning your call

## 2021-05-25 NOTE — Telephone Encounter (Signed)
Called patient she has refill of the 10mg  Celexa she states that she went back on her original 20mg  and it is helping more. She would like refill of the 20mg  called in. I have made physical for March.

## 2021-05-26 MED ORDER — CITALOPRAM HYDROBROMIDE 20 MG PO TABS: 20.0000 mg | ORAL_TABLET | Freq: Every day | ORAL | 0 refills | Status: DC

## 2021-05-26 NOTE — Telephone Encounter (Signed)
Noted, refills sent to pharmacy. Changed to 20 mg tablet.

## 2021-05-27 ENCOUNTER — Ambulatory Visit: Payer: PPO | Admitting: Pharmacist

## 2021-05-27 ENCOUNTER — Other Ambulatory Visit: Payer: Self-pay

## 2021-05-27 DIAGNOSIS — E782 Mixed hyperlipidemia: Secondary | ICD-10-CM | POA: Diagnosis not present

## 2021-05-27 NOTE — Patient Instructions (Addendum)
It was nice to meet you today!  Your LDL cholesterol is 183 and your goal is < 70  After January 1, we'll retry you on Praluent 75mg  every 2 weeks. The copay should drop down to  $90 for a 3 month supply  Keep an eye out for any signs of an allergic reaction with the Praluent and keep your Epipen on hand  Primary care can recheck your labs when you see her in March

## 2021-05-27 NOTE — Progress Notes (Signed)
Patient ID: Nancy [name]                 DOB: Mar 12, 1956                    MRN: [id number]     HPI: Nancy [name] is a 65 y.o. female patient referred to lipid clinic by Dr Acie Fredrickson. PMH is significant for HTN with face tingling, elevated coronary calcium score of 243 (91st percentile for age and sex matched control), and anxiety. Also developed alpha gal allergy, now develops a rash when she eats beef.   Previously took [place] but had a suboptimal response to therapy (LDL dropped 29%). Did report back aches, sore throat, and headaches, wasn't sure if they were from therapy. She was then changed to [facility] 04/2020. Stopped 10/2020 as she reported anxiety and tremors and was also concerned about her alpha gal since Praluent is derived from [place] hamster ovary cells. Pt today does not recall adverse effects with either injection, primarily changed from Repatha to Praluent due to lower than expected efficacy with Repatha. Tolerated Praluent when she took it. Did not have signs of allergic reaction. Went a full year without eating meat and dairy. Recently ate a burger to rechallenge and see if her alpha gal had improved but she broke out in a full body rash. Has an Epipen on hand if needed. Has not had breathing issues with alpha gal allergy, mainly itching and diffuse rash.  Current Medications: none Intolerances: atorvastatin 20mg  daily, rosuvastatin 10mg  daily and 5mg  2x per week, simvastatin 20mg  daily, ezetimibe 10mg  daily, Repatha - back aches, sore throat, headaches Risk Factors: elevated calcium score LDL goal: 70mg /dL  Family History: Diabetes in her father; Hydrocephalus in her mother; Hypertension in her father; Neuropathy in her mother; Thyroid cancer in her cousin; Thyroid disease in her maternal aunt.  Social History: Denies alcohol, drug and tobacco use.  Labs: 05/05/21: TC 267, TG 106, HDL 66, LDL 183 (no LLT) 08/06/20: TC 185, TG 123, HDL 71, LDL 89 (Praluent 75mg  Q2W) 01/23/20:  TC 217, TG 102, HDL 79, LDL 120 (Repatha 140mg  Q2W) 11/19/19: TC 251, TG 112, HDL 62, LDL 169 (no LLT)   Past Medical History:  Diagnosis Date   Allergy to alpha-gal    Breast cancer ([place]) 10/23/2020   Chronic neck pain 04/11/2018   Eczema    Essential hypertension 07/20/2019   Fatigue    GAD (generalized anxiety disorder) 06/26/2018   Hyperlipidemia    Lichen sclerosus    Overweight (BMI 25.0-29.9) 01/09/2018   Recurrent upper respiratory infection (URI)    Routine general medical examination at a health care facility    Urinary frequency 06/06/2019   Urticaria    Vaginal itching 06/06/2019    Current Outpatient Medications on File Prior to Visit  Medication Sig Dispense Refill   Calcium-Phosphorus-Vitamin D (CALCIUM/D3 ADULT GUMMIES PO) Take by mouth.     citalopram (CELEXA) 20 MG tablet Take 1 tablet (20 mg total) by mouth daily. For anxiety. Office visit required in March 2023 for further refills. 90 tablet 0   clobetasol cream (TEMOVATE) 4.33 % Apply 1 application topically as needed.     Cyanocobalamin (B-12) 1000 MCG CAPS Take 1 tablet by mouth daily.     diphenhydrAMINE HCl ([facility] ALLERGY PO) Take by mouth as needed.     EPINEPHrine 0.3 mg/0.3 mL IJ SOAJ injection Inject 0.3 mg into the muscle as needed for anaphylaxis. 1 each 0  No current facility-administered medications on file prior to visit.    Allergies  Allergen Reactions   Alpha-Gal Anaphylaxis, Hives, Itching and Rash   Macrobid [Nitrofurantoin] Other (See Comments)    Hypoglycemia   Milk Protein    Other     Red Meats, Milk, Shellfish   Shellfish Allergy    Statins Other (See Comments)    Myalgias    Zetia [Ezetimibe] Other (See Comments)    Myalgias    Assessment/Plan:  1. Hyperlipidemia - LDL 183 above goal < 70 given elevated calcium score. Pt intolerant to 3 statins and ezetimibe. Suboptimal response to Repatha. Previously tolerated Praluent well with better response. Stopped due to concern with  alpha gal allergy. Discussed that mouse and hamster ovarian cells are used to mass produce the fully humanized monoclonal antibodies in PCSK9i therapy. Since the monoclonal antibodies in Praluent are derived from [place] hamster protein, there is likely low amount of mammal protein in the parts per million in [place]. When PharmD reached out to [facility] last year to inquire about this, they reported that all materials used in the production of Praluent were evaluated and found to be of non-animal origin and that pt should be safe to continue on Praluent. Pt also previously tolerated Praluent without signs of allergic reaction in the 4-5 months she was on therapy after her alpha gal dx. Will restart Praluent 75mg  Q2W in January (Praluent drops from Tier 4 to Tier 3 on her insurance plan in January which will save her $100 of her copay). Pt aware to monitor for any signs of allergic reaction. She has an Epipen on hand if needed. Sees her PCP in March who can recheck lipids.  Agatha Duplechain E. Berdell Hostetler, PharmD, BCACP, [facility] [address] [place], [place] [address] Phone: [phone]; Fax: [phone] 05/27/2021 2:57 PM

## 2021-06-01 DIAGNOSIS — D0512 Intraductal carcinoma in situ of left breast: Secondary | ICD-10-CM | POA: Diagnosis not present

## 2021-06-16 ENCOUNTER — Telehealth: Payer: Self-pay | Admitting: Pharmacist

## 2021-06-16 MED ORDER — PRALUENT 75 MG/ML ~~LOC~~ SOAJ: 1.0000 "pen " | SUBCUTANEOUS | 3 refills | Status: DC

## 2021-06-16 NOTE — Telephone Encounter (Addendum)
[employer] insurance has new PBM this year -  ID: [id number] BIN: [id number] PCN: [id number] GRP: [id number]  PA submitted for Praluent, approved through 12/13/21. Rx sent to pharmacy as 3 month supply for cost savings. Pt aware and will have PCP check labs when she sees her in March.

## 2021-07-21 DIAGNOSIS — H524 Presbyopia: Secondary | ICD-10-CM | POA: Diagnosis not present

## 2021-07-28 ENCOUNTER — Other Ambulatory Visit: Payer: Self-pay | Admitting: Primary Care

## 2021-07-28 DIAGNOSIS — F411 Generalized anxiety disorder: Secondary | ICD-10-CM

## 2021-08-07 DIAGNOSIS — D485 Neoplasm of uncertain behavior of skin: Secondary | ICD-10-CM | POA: Diagnosis not present

## 2021-08-07 DIAGNOSIS — D225 Melanocytic nevi of trunk: Secondary | ICD-10-CM | POA: Diagnosis not present

## 2021-08-07 DIAGNOSIS — L718 Other rosacea: Secondary | ICD-10-CM | POA: Diagnosis not present

## 2021-08-07 DIAGNOSIS — D1801 Hemangioma of skin and subcutaneous tissue: Secondary | ICD-10-CM | POA: Diagnosis not present

## 2021-08-07 DIAGNOSIS — L821 Other seborrheic keratosis: Secondary | ICD-10-CM | POA: Diagnosis not present

## 2021-08-07 DIAGNOSIS — L900 Lichen sclerosus et atrophicus: Secondary | ICD-10-CM | POA: Diagnosis not present

## 2021-08-07 DIAGNOSIS — R208 Other disturbances of skin sensation: Secondary | ICD-10-CM | POA: Diagnosis not present

## 2021-08-07 DIAGNOSIS — L814 Other melanin hyperpigmentation: Secondary | ICD-10-CM | POA: Diagnosis not present

## 2021-08-20 ENCOUNTER — Encounter: Payer: Self-pay | Admitting: Primary Care

## 2021-08-20 ENCOUNTER — Ambulatory Visit (INDEPENDENT_AMBULATORY_CARE_PROVIDER_SITE_OTHER): Payer: PPO | Admitting: Primary Care

## 2021-08-20 ENCOUNTER — Other Ambulatory Visit: Payer: Self-pay

## 2021-08-20 VITALS — BP 130/64 | HR 72 | Temp 98.3°F | Ht 63.0 in | Wt 151.0 lb

## 2021-08-20 DIAGNOSIS — R42 Dizziness and giddiness: Secondary | ICD-10-CM | POA: Diagnosis not present

## 2021-08-20 DIAGNOSIS — Z91018 Allergy to other foods: Secondary | ICD-10-CM | POA: Diagnosis not present

## 2021-08-20 DIAGNOSIS — E782 Mixed hyperlipidemia: Secondary | ICD-10-CM

## 2021-08-20 DIAGNOSIS — F411 Generalized anxiety disorder: Secondary | ICD-10-CM | POA: Diagnosis not present

## 2021-08-20 DIAGNOSIS — E538 Deficiency of other specified B group vitamins: Secondary | ICD-10-CM | POA: Insufficient documentation

## 2021-08-20 DIAGNOSIS — R7303 Prediabetes: Secondary | ICD-10-CM | POA: Diagnosis not present

## 2021-08-20 DIAGNOSIS — I10 Essential (primary) hypertension: Secondary | ICD-10-CM | POA: Diagnosis not present

## 2021-08-20 DIAGNOSIS — Z0000 Encounter for general adult medical examination without abnormal findings: Secondary | ICD-10-CM

## 2021-08-20 DIAGNOSIS — L900 Lichen sclerosus et atrophicus: Secondary | ICD-10-CM

## 2021-08-20 DIAGNOSIS — D0512 Intraductal carcinoma in situ of left breast: Secondary | ICD-10-CM

## 2021-08-20 DIAGNOSIS — Z23 Encounter for immunization: Secondary | ICD-10-CM | POA: Diagnosis not present

## 2021-08-20 DIAGNOSIS — E559 Vitamin D deficiency, unspecified: Secondary | ICD-10-CM | POA: Diagnosis not present

## 2021-08-20 LAB — LIPID PANEL
Cholesterol: 233 mg/dL — ABNORMAL HIGH (ref 0–200)
HDL: 87.7 mg/dL (ref >39.00)
LDL Cholesterol: 124 mg/dL — ABNORMAL HIGH (ref 0–99)
NonHDL: 144.88
Total CHOL/HDL Ratio: 3
Triglycerides: 103 mg/dL (ref 0.0–149.0)
VLDL: 20.6 mg/dL (ref 0.0–40.0)

## 2021-08-20 LAB — COMPREHENSIVE METABOLIC PANEL
ALT: 13 U/L (ref 0–35)
AST: 20 U/L (ref 0–37)
Albumin: 4.5 g/dL (ref 3.5–5.2)
Alkaline Phosphatase: 84 U/L (ref 39–117)
BUN: 13 mg/dL (ref 6–23)
CO2: 31 mEq/L (ref 19–32)
Calcium: 9.6 mg/dL (ref 8.4–10.5)
Chloride: 100 mEq/L (ref 96–112)
Creatinine, Ser: 0.74 mg/dL (ref 0.40–1.20)
GFR: 84.14 mL/min (ref >60.00)
Glucose, Bld: 90 mg/dL (ref 70–99)
Potassium: 4.6 mEq/L (ref 3.5–5.1)
Sodium: 139 mEq/L (ref 135–145)
Total Bilirubin: 0.6 mg/dL (ref 0.2–1.2)
Total Protein: 7.1 g/dL (ref 6.0–8.3)

## 2021-08-20 LAB — VITAMIN B12: Vitamin B-12: 1079 pg/mL — ABNORMAL HIGH (ref 211–911)

## 2021-08-20 LAB — CBC
HCT: 41.3 % (ref 36.0–46.0)
Hemoglobin: 13.8 g/dL (ref 12.0–15.0)
MCHC: 33.4 g/dL (ref 30.0–36.0)
MCV: 87.3 fl (ref 78.0–100.0)
Platelets: 258 10*3/uL (ref 150.0–400.0)
RBC: 4.73 Mil/uL (ref 3.87–5.11)
RDW: 13.4 % (ref 11.5–15.5)
WBC: 6.6 10*3/uL (ref 4.0–10.5)

## 2021-08-20 LAB — HEMOGLOBIN A1C: Hgb A1c MFr Bld: 6 % (ref 4.6–6.5)

## 2021-08-20 LAB — VITAMIN D 25 HYDROXY (VIT D DEFICIENCY, FRACTURES): VITD: 42.15 ng/mL (ref 30.00–100.00)

## 2021-08-20 MED ORDER — CITALOPRAM HYDROBROMIDE 20 MG PO TABS: 20.0000 mg | ORAL_TABLET | Freq: Every day | ORAL | 3 refills | Status: DC

## 2021-08-20 NOTE — Assessment & Plan Note (Signed)
Repeat A1C pending.  Discussed the importance of a healthy diet and regular exercise in order for weight loss, and to reduce the risk of further co-morbidity.  

## 2021-08-20 NOTE — Assessment & Plan Note (Signed)
Following with cardiology. ? ?Continue Praluent 75 mg every 14 days for which she resumed in January 2023. ? ?Repeat lipid panel pending.  ?

## 2021-08-20 NOTE — Assessment & Plan Note (Addendum)
Diagnosed in May 2022, not taking medications.  ? ?No longer following with surgery. ?Repeat mammogram due in May 2023. ?

## 2021-08-20 NOTE — Progress Notes (Signed)
? ?Subjective:  ? ? Patient ID: Nancy [name], female    DOB: March 08, 1956, 66 y.o.   MRN: [id number] ? ?HPI ? ?Nancy [name] is a very pleasant 66 y.o. female who presents today for complete physical and follow up of chronic conditions. ? ?Immunizations: ?-Tetanus: 2017 ?-Influenza: Did not complete the season ?-Covid-19: Completed 2 vaccine ?-Shingles: Never completed  ?-Pneumonia: Completed Prevnar 13 in 2022 ? ?Diet: Fair diet.  ?Exercise: No regular exercise. ? ?Eye exam: Completes annually  ?Dental exam: Completes semi-annually  ? ?Mammogram: Completed in May and June 2022 ?Dexa: Completed in 2022, normal ?Colonoscopy: Completed in May 2022, due May 2023 ? ?BP Readings from Last 3 Encounters:  ?08/20/21 130/64  ?04/24/21 130/74  ?03/27/21 118/86  ? ? ? ? ? ?Review of Systems  ?Constitutional:  Negative for unexpected weight change.  ?HENT:  Negative for rhinorrhea.   ?Respiratory:  Negative for cough and shortness of breath.   ?Cardiovascular:  Negative for chest pain.  ?Gastrointestinal:  Negative for constipation and diarrhea.  ?Genitourinary:  Negative for difficulty urinating.  ?Musculoskeletal:  Positive for arthralgias and neck pain.  ?Skin:  Negative for rash.  ?Allergic/Immunologic: Positive for environmental allergies.  ?Neurological:  Negative for dizziness and headaches.  ?Psychiatric/Behavioral:  The patient is not nervous/anxious.   ? ?   ? ? ?Past Medical History:  ?Diagnosis Date  ? Allergy to alpha-gal   ? Breast cancer ([place]) 10/23/2020  ? Chronic neck pain 04/11/2018  ? Eczema   ? Essential hypertension 07/20/2019  ? Fatigue   ? GAD (generalized anxiety disorder) 06/26/2018  ? Hyperlipidemia   ? Lichen sclerosus   ? Overweight (BMI 25.0-29.9) 01/09/2018  ? Recurrent upper respiratory infection (URI)   ? Routine general medical examination at a health care facility   ? Urinary frequency 06/06/2019  ? Urticaria   ? Vaginal itching 06/06/2019  ? ? ?Social History  ? ?Socioeconomic History  ? Marital  status: Single  ?  Spouse name: Not on file  ? Number of children: 2  ? Years of education: Not on file  ? Highest education level: Not on file  ?Occupational History  ? Not on file  ?Tobacco Use  ? Smoking status: Never  ? Smokeless tobacco: Never  ?Vaping Use  ? Vaping Use: Never used  ?Substance and Sexual Activity  ? Alcohol use: Not Currently  ?  Alcohol/week: 0.0 standard drinks  ? Drug use: No  ? Sexual activity: Not on file  ?Other Topics Concern  ? Not on file  ?Social History Narrative  ? Divorced.  G3P2.  ? Regular exercise-yes  ? ?Social Determinants of Health  ? ?Financial Resource Strain: Not on file  ?Food Insecurity: Not on file  ?Transportation Needs: Not on file  ?Physical Activity: Not on file  ?Stress: Not on file  ?Social Connections: Not on file  ?Intimate Partner Violence: Not At Risk  ? Fear of Current or Ex-Partner: No  ? Emotionally Abused: No  ? Physically Abused: No  ? Sexually Abused: No  ? ? ?Past Surgical History:  ?Procedure Laterality Date  ? ANTERIOR CERVICAL DECOMP/DISCECTOMY FUSION    ? BREAST LUMPECTOMY WITH RADIOACTIVE SEED LOCALIZATION Left 11/14/2020  ? Procedure: LEFT BREAST LUMPECTOMY WITH RADIOACTIVE SEED LOCALIZATION;  Surgeon: Erroll Luna, MD;  Location: [facility];  Service: General;  Laterality: Left;  ? COLONOSCOPY WITH PROPOFOL N/A 10/06/2020  ? Procedure: COLONOSCOPY WITH PROPOFOL;  Surgeon: Virgel Manifold, MD;  Location: [facility]  ENDOSCOPY;  Service: Endoscopy;  Laterality: N/A;  ? PARTIAL HYSTERECTOMY    ? TUBAL LIGATION    ? ? ?Family History  ?Problem Relation Age of Onset  ? Hydrocephalus Mother   ? Neuropathy Mother   ? Diabetes Father   ? Hypertension Father   ? Thyroid cancer Cousin   ? Thyroid disease Maternal Aunt   ? ? ?Allergies  ?Allergen Reactions  ? Alpha-Gal Anaphylaxis, Hives, Itching and Rash  ? Macrobid [Nitrofurantoin] Other (See Comments)  ?  Hypoglycemia  ? Milk Protein   ? Other   ?  Red Meats, Milk, Shellfish  ? Shellfish  Allergy   ? Statins Other (See Comments)  ?  Myalgias   ? Zetia [Ezetimibe] Other (See Comments)  ?  Myalgias  ? ? ?Current Outpatient Medications on File Prior to Visit  ?Medication Sig Dispense Refill  ? Alirocumab (PRALUENT) 75 MG/ML SOAJ Inject 1 pen into the skin every 14 (fourteen) days. 6 mL 3  ? Calcium-Phosphorus-Vitamin D (CALCIUM/D3 ADULT GUMMIES PO) Take by mouth.    ? citalopram (CELEXA) 20 MG tablet Take 1 tablet (20 mg total) by mouth daily. For anxiety. Office visit required in March 2023 for further refills. 90 tablet 0  ? clobetasol cream (TEMOVATE) 3.71 % Apply 1 application topically as needed.    ? Cyanocobalamin (B-12) 1000 MCG CAPS Take 1 tablet by mouth daily.    ? diphenhydrAMINE HCl ([employer] ALLERGY PO) Take by mouth as needed.    ? EPINEPHrine 0.3 mg/0.3 mL IJ SOAJ injection Inject 0.3 mg into the muscle as needed for anaphylaxis. 1 each 0  ? metroNIDAZOLE (METROCREAM) 0.75 % cream Apply topically 2 (two) times daily.    ? ?No current facility-administered medications on file prior to visit.  ? ? ?BP 130/64   Pulse 72   Temp 98.3 ?F (36.8 ?C) (Oral)   Ht '5\' 3"'$  (1.6 m)   Wt 151 lb (68.5 kg)   SpO2 97%   BMI 26.75 kg/m?  ?Objective:  ? Physical Exam ?HENT:  ?   Right Ear: Tympanic membrane and ear canal normal.  ?   Left Ear: Tympanic membrane and ear canal normal.  ?   Nose: Nose normal.  ?Eyes:  ?   Conjunctiva/sclera: Conjunctivae normal.  ?   Pupils: Pupils are equal, round, and reactive to light.  ?Neck:  ?   Thyroid: No thyromegaly.  ?Cardiovascular:  ?   Rate and Rhythm: Normal rate and regular rhythm.  ?   Heart sounds: No murmur heard. ?Pulmonary:  ?   Effort: Pulmonary effort is normal.  ?   Breath sounds: Normal breath sounds. No rales.  ?Abdominal:  ?   General: Bowel sounds are normal.  ?   Palpations: Abdomen is soft.  ?   Tenderness: There is no abdominal tenderness.  ?Musculoskeletal:     ?   General: Normal range of motion.  ?   Cervical back: Neck supple.   ?Lymphadenopathy:  ?   Cervical: No cervical adenopathy.  ?Skin: ?   General: Skin is warm and dry.  ?   Findings: No rash.  ?Neurological:  ?   Mental Status: She is alert and oriented to person, place, and time.  ?   Cranial Nerves: No cranial nerve deficit.  ?   Deep Tendon Reflexes: Reflexes are normal and symmetric.  ?Psychiatric:     ?   Mood and Affect: Mood normal.  ? ? ? ? ? ?   ?  Assessment & Plan:  ? ? ? ? ?This visit occurred during the SARS-CoV-2 public health emergency.  Safety protocols were in place, including screening questions prior to the visit, additional usage of staff PPE, and extensive cleaning of exam room while observing appropriate contact time as indicated for disinfecting solutions.  ?

## 2021-08-20 NOTE — Assessment & Plan Note (Signed)
Diagnosed in late 2021. ?Repeat levels pending.  ? ?No use of Epi Pen.  ? ?Continue to monitor.  ?

## 2021-08-20 NOTE — Assessment & Plan Note (Signed)
Controlled. ? ?Continue to monitor off medications.  ?

## 2021-08-20 NOTE — Assessment & Plan Note (Signed)
Pneumovax 23 due and provided, declines Shingles vaccines. Other vaccines UTD. ? ?Mammogram and bone density scan UTD. ?Colonoscopy UTD, due May 2023, she is aware. ? ?Discussed the importance of a healthy diet and regular exercise in order for weight loss, and to reduce the risk of further co-morbidity. ? ?Exam today stable.  ?Labs pending and also reviewed from chart.  ?

## 2021-08-20 NOTE — Patient Instructions (Addendum)
Stop by the lab prior to leaving today. I will notify you of your results once received.  ? ?Discuss Praluent with cardiology. ? ?It was a pleasure to see you today! ? ?Preventive Care 83 Years and Older, Female ?Preventive care refers to lifestyle choices and visits with your health care provider that can promote health and wellness. Preventive care visits are also called wellness exams. ?What can I expect for my preventive care visit? ?Counseling ?Your health care provider may ask you questions about your: ?Medical history, including: ?Past medical problems. ?Family medical history. ?Pregnancy and menstrual history. ?History of falls. ?Current health, including: ?Memory and ability to understand (cognition). ?Emotional well-being. ?Home life and relationship well-being. ?Sexual activity and sexual health. ?Lifestyle, including: ?Alcohol, nicotine or tobacco, and drug use. ?Access to firearms. ?Diet, exercise, and sleep habits. ?Work and work Statistician. ?Sunscreen use. ?Safety issues such as seatbelt and bike helmet use. ?Physical exam ?Your health care provider will check your: ?Height and weight. These may be used to calculate your BMI (body mass index). BMI is a measurement that tells if you are at a healthy weight. ?Waist circumference. This measures the distance around your waistline. This measurement also tells if you are at a healthy weight and may help predict your risk of certain diseases, such as type 2 diabetes and high blood pressure. ?Heart rate and blood pressure. ?Body temperature. ?Skin for abnormal spots. ?What immunizations do I need? ?Vaccines are usually given at various ages, according to a schedule. Your health care provider will recommend vaccines for you based on your age, medical history, and lifestyle or other factors, such as travel or where you work. ?What tests do I need? ?Screening ?Your health care provider may recommend screening tests for certain conditions. This may  include: ?Lipid and cholesterol levels. ?Hepatitis C test. ?Hepatitis B test. ?HIV (human immunodeficiency virus) test. ?STI (sexually transmitted infection) testing, if you are at risk. ?Lung cancer screening. ?Colorectal cancer screening. ?Diabetes screening. This is done by checking your blood sugar (glucose) after you have not eaten for a while (fasting). ?Mammogram. Talk with your health care provider about how often you should have regular mammograms. ?BRCA-related cancer screening. This may be done if you have a family history of breast, ovarian, tubal, or peritoneal cancers. ?Bone density scan. This is done to screen for osteoporosis. ?Talk with your health care provider about your test results, treatment options, and if necessary, the need for more tests. ?Follow these instructions at home: ?Eating and drinking ? ?Eat a diet that includes fresh fruits and vegetables, whole grains, lean protein, and low-fat dairy products. Limit your intake of foods with high amounts of sugar, saturated fats, and salt. ?Take vitamin and mineral supplements as recommended by your health care provider. ?Do not drink alcohol if your health care provider tells you not to drink. ?If you drink alcohol: ?Limit how much you have to 0-1 drink a day. ?Know how much alcohol is in your drink. In the [place] one drink equals one 12 oz bottle of beer (355 mL), one 5 oz glass of wine (148 mL), or one 1? oz glass of hard liquor (44 mL). ?Lifestyle ?Brush your teeth every morning and night with fluoride toothpaste. Floss one time each day. ?Exercise for at least 30 minutes 5 or more days each week. ?Do not use any products that contain nicotine or tobacco. These products include cigarettes, chewing tobacco, and vaping devices, such as e-cigarettes. If you need help quitting, ask your health care  provider. ?Do not use drugs. ?If you are sexually active, practice safe sex. Use a condom or other form of protection in order to prevent STIs. ?Take  aspirin only as told by your health care provider. Make sure that you understand how much to take and what form to take. Work with your health care provider to find out whether it is safe and beneficial for you to take aspirin daily. ?Ask your health care provider if you need to take a cholesterol-lowering medicine (statin). ?Find healthy ways to manage stress, such as: ?Meditation, yoga, or listening to music. ?Journaling. ?Talking to a trusted person. ?Spending time with friends and family. ?Minimize exposure to UV radiation to reduce your risk of skin cancer. ?Safety ?Always wear your seat belt while driving or riding in a vehicle. ?Do not drive: ?If you have been drinking alcohol. Do not ride with someone who has been drinking. ?When you are tired or distracted. ?While texting. ?If you have been using any mind-altering substances or drugs. ?Wear a helmet and other protective equipment during sports activities. ?If you have firearms in your house, make sure you follow all gun safety procedures. ?What's next? ?Visit your health care provider once a year for an annual wellness visit. ?Ask your health care provider how often you should have your eyes and teeth checked. ?Stay up to date on all vaccines. ?This information is not intended to replace advice given to you by your health care provider. Make sure you discuss any questions you have with your health care provider. ?Document Revised: 11/26/2020 Document Reviewed: 11/26/2020 ?Elsevier Patient Education ? [place]. ? ?

## 2021-08-20 NOTE — Assessment & Plan Note (Signed)
Controlled. ? ?Continue citalopram 20 mg daily. ?

## 2021-08-20 NOTE — Assessment & Plan Note (Signed)
Chronic, stable. ? ?Continue clobetasol 0.05% cream as needed. ?Following with GYN. ?

## 2021-08-20 NOTE — Assessment & Plan Note (Signed)
Continue vitamin B12 1000 mcg. ?Repeat B12 level pending. ?

## 2021-08-20 NOTE — Assessment & Plan Note (Signed)
Chronic since beginning Praulent. ? ?Controlled. ?Continue to monitor.  ?

## 2021-08-21 ENCOUNTER — Telehealth: Payer: Self-pay | Admitting: Pharmacist

## 2021-08-21 NOTE — Telephone Encounter (Signed)
Lipids checked at PCP office yesterday after starting Praluent. LDL has decreased from 183 to 124 which is only a 32% reduction (60% reduction is anticipated). LDL goal is < 70 due to elevated calcium score. Pt is previously intolerant to many lipid lowering therapies including: atorvastatin '20mg'$  daily, rosuvastatin '10mg'$  daily and '5mg'$  2x per week, simvastatin '20mg'$  daily, ezetimibe '10mg'$  daily, Repatha - back aches, sore throat, headaches. ? ?Called pt to discuss adherence to Praluent given less than anticipated response, especially since her LDL was previously 89 on this regimen a year ago. PCP note mentions pt reported vertigo since starting Praluent. This is not a reported side effect of the medication. If she has been adherent, ideally need to increase her dose to '150mg'$  Q2W. ? ?Left message for pt. ?

## 2021-08-25 MED ORDER — PRAVASTATIN SODIUM 20 MG PO TABS: 20.0000 mg | ORAL_TABLET | Freq: Every evening | ORAL | 11 refills | Status: DC

## 2021-08-25 NOTE — Telephone Encounter (Signed)
Called pt again. She reports she's given 3 injections of her Praluent and is due to give her 4th soon. However, she reports flu-like symptoms and feels like a truck runs over her for the 4 days after each of her injections. She has multiple prior intolerances noted below. She wishes to try a different statin. Will start pravastatin '20mg'$  daily. Advised pt she can decrease to '10mg'$  daily if she develops side effects on '20mg'$  dosing. She'll let us know how she tolerates this, ideally would need cholesterol rechecked a few months later. Her insurance does not cover Leqvio well. ?

## 2021-08-26 LAB — ALPHA-GAL PANEL
Allergen, Mutton, f88: 22.4 kU/L — ABNORMAL HIGH
Allergen, Pork, f26: 19.3 kU/L — ABNORMAL HIGH
Beef: 26.1 kU/L — ABNORMAL HIGH
CLASS: 4
CLASS: 4
Class: 4

## 2021-08-26 LAB — INTERPRETATION:

## 2021-08-31 NOTE — Addendum Note (Signed)
Addended by: Francella Solian on: 08/31/2021 03:02 PM ? ? Modules accepted: Orders ? ?

## 2021-09-01 LAB — ALPHA GAL IGE: GALACTOSE-ALPHA-1,3-GALACTOSE IGE*: 59.8 kU/L — ABNORMAL HIGH (ref <0.10)

## 2021-09-22 ENCOUNTER — Encounter (HOSPITAL_COMMUNITY): Payer: Self-pay

## 2021-09-25 ENCOUNTER — Emergency Department (HOSPITAL_COMMUNITY)
Admission: EM | Admit: 2021-09-25 | Discharge: 2021-09-25 | Disposition: A | Discharge status: Home / Self Care | Payer: PPO | Attending: Emergency Medicine | Admitting: Emergency Medicine

## 2021-09-25 ENCOUNTER — Other Ambulatory Visit: Payer: Self-pay

## 2021-09-25 ENCOUNTER — Encounter (HOSPITAL_COMMUNITY): Payer: Self-pay | Admitting: Student

## 2021-09-25 DIAGNOSIS — R062 Wheezing: Secondary | ICD-10-CM | POA: Insufficient documentation

## 2021-09-25 DIAGNOSIS — T7840XA Allergy, unspecified, initial encounter: Secondary | ICD-10-CM

## 2021-09-25 DIAGNOSIS — L500 Allergic urticaria: Secondary | ICD-10-CM | POA: Insufficient documentation

## 2021-09-25 DIAGNOSIS — R059 Cough, unspecified: Secondary | ICD-10-CM | POA: Diagnosis not present

## 2021-09-25 DIAGNOSIS — I10 Essential (primary) hypertension: Secondary | ICD-10-CM | POA: Insufficient documentation

## 2021-09-25 DIAGNOSIS — R21 Rash and other nonspecific skin eruption: Secondary | ICD-10-CM | POA: Diagnosis present

## 2021-09-25 MED ORDER — FAMOTIDINE 20 MG PO TABS
20.0000 mg | ORAL_TABLET | Freq: Once | ORAL | Status: AC
  Administered 2021-09-25: 20 mg via ORAL
  Filled 2021-09-25: qty 1

## 2021-09-25 MED ORDER — DIPHENHYDRAMINE HCL 25 MG PO TABS
25.0000 mg | ORAL_TABLET | Freq: Four times a day (QID) | ORAL | 0 refills | Status: DC | PRN
Start: 2021-09-25 — End: 2023-09-01

## 2021-09-25 MED ORDER — FAMOTIDINE 20 MG PO TABS: 20.0000 mg | ORAL_TABLET | Freq: Two times a day (BID) | ORAL | 0 refills | Status: DC | PRN

## 2021-09-25 MED ORDER — DIPHENHYDRAMINE HCL 25 MG PO CAPS
50.0000 mg | ORAL_CAPSULE | Freq: Once | ORAL | Status: AC
  Administered 2021-09-25: 50 mg via ORAL
  Filled 2021-09-25: qty 2

## 2021-09-25 NOTE — ED Triage Notes (Signed)
Patient with alpha gal syndrome, ate ribs for dinner and started getting a wheezy cough, and rash after dinner.   ?

## 2021-09-25 NOTE — ED Provider Notes (Signed)
?[facility] ?Provider Note ? ? ?CSN: [id number] ?Arrival date & time: 09/25/21  2053 ? ?  ? ?History ? ?Chief Complaint  ?Patient presents with  ? Allergic Reaction  ? ? ?Nancy [name] is a 66 y.o. female with a history of hypertension, hyperlipidemia, anxiety, and alpha gal syndrome who presents to the emergency department for evaluation of allergic reaction that began at 20:00 tonight.  Patient reports that she had some ribs tonight and a couple of hours later she developed pruritic hives, she also had some wheezing cough sensation.  She states overall her symptoms are gradually improving, the wheezing/cough has completely resolved, the rash seems to be getting better.  She did not take any medications prior to arrival.  She denies sensation of throat closing, shortness of breath, vomiting, syncope, or abdominal pain.  She states that with her history of alcohol she had not had red meat for a while, tried it tonight, and subsequently had this reaction. ? ?HPI ? ?  ? ?Home Medications ?Prior to Admission medications   ?Medication Sig Start Date End Date Taking? Authorizing Provider  ?Calcium-Phosphorus-Vitamin D (CALCIUM/D3 ADULT GUMMIES PO) Take by mouth.    [provider]  ?citalopram (CELEXA) 20 MG tablet Take 1 tablet (20 mg total) by mouth daily. For anxiety. 08/20/21   Pleas Koch, NP  ?clobetasol cream (TEMOVATE) 7.00 % Apply 1 application topically as needed. 07/18/19   [provider]  ?Cyanocobalamin (B-12) 1000 MCG CAPS Take 1 tablet by mouth daily. 08/08/20   [provider]  ?diphenhydrAMINE HCl ([facility] ALLERGY PO) Take by mouth as needed.    [provider]  ?EPINEPHrine 0.3 mg/0.3 mL IJ SOAJ injection Inject 0.3 mg into the muscle as needed for anaphylaxis. 05/21/21   Pleas Koch, NP  ?metroNIDAZOLE (METROCREAM) 0.75 % cream Apply topically 2 (two) times daily. 08/07/21   [provider]  ?pravastatin (PRAVACHOL)  20 MG tablet Take 1 tablet (20 mg total) by mouth every evening. 08/25/21   [name], Wonda Cheng, MD  ?   ? ?Allergies    ?Alpha-gal, Macrobid [nitrofurantoin], Atorvastatin, Milk protein, Other, Praluent [alirocumab], Repatha [evolocumab], Rosuvastatin, Shellfish allergy, Simvastatin, Statins, and Zetia [ezetimibe]   ? ?Review of Systems   ?Review of Systems  ?Constitutional:  Negative for chills and fever.  ?Respiratory:  Positive for cough and wheezing. Negative for shortness of breath.   ?Cardiovascular:  Negative for chest pain.  ?Gastrointestinal:  Negative for abdominal pain, nausea and vomiting.  ?Skin:  Positive for rash.  ?Neurological:  Negative for syncope.  ?All other systems reviewed and are negative. ? ?Physical Exam ?Updated Vital Signs ?BP (!) 177/103   Pulse 80   Temp 98.6 ?F (37 ?C) (Oral)   Resp 19   SpO2 96%  ?Physical Exam ?Vitals and nursing note reviewed.  ?Constitutional:   ?   General: She is not in acute distress. ?   Appearance: She is well-developed. She is not toxic-appearing.  ?HENT:  ?   Head: Normocephalic and atraumatic.  ?   Mouth/Throat:  ?   Mouth: No angioedema.  ?   Pharynx: Oropharynx is clear. Uvula midline.  ?   Comments: Posterior oropharynx is symmetric appearing. Patient tolerating own secretions without difficulty. No trismus. No drooling. No hot potato voice. No swelling beneath the tongue, submandibular compartment is soft.  ?Eyes:  ?   General:     ?   Right eye: No discharge.     ?  Left eye: No discharge.  ?   Conjunctiva/sclera: Conjunctivae normal.  ?Cardiovascular:  ?   Rate and Rhythm: Normal rate and regular rhythm.  ?Pulmonary:  ?   Effort: No respiratory distress.  ?   Breath sounds: Normal breath sounds. No wheezing or rales.  ?Abdominal:  ?   General: There is no distension.  ?   Palpations: Abdomen is soft.  ?   Tenderness: There is no abdominal tenderness.  ?Musculoskeletal:  ?   Cervical back: Neck supple.  ?Skin: ?   General: Skin is warm and dry.  ?    Findings: Rash present. Rash is urticarial.  ?Neurological:  ?   Mental Status: She is alert.  ?   Comments: Clear speech.   ?Psychiatric:     ?   Behavior: Behavior normal.  ? ? ?ED Results / Procedures / Treatments   ?Labs ?(all labs ordered are listed, but only abnormal results are displayed) ?Labs Reviewed - No data to display ? ?EKG ?None ? ?Radiology ?No results found. ? ?Procedures ?Procedures  ? ? ?Medications Ordered in ED ?Medications - No data to display ? ?ED Course/ Medical Decision Making/ A&P ?  ?                        ?Medical Decision Making ?Risk ?OTC drugs. ? ? ?Patient presents to the ED with complaints of allergic reaction.  Vitals notable for elevated blood pressure, improved to 150s over 80s on my recheck.  Chart reviewed for additional hx including external records of alpha gal testing. On exam patient does have somewhat urticarial rash throughout the extremities/trunk.  Clinical presentation does not seem consistent with anaphylaxis.  There are no signs of angioedema.  Airways patent patient is tolerating her own secretions without difficulty.  She is had gradual improvement in her symptoms, her initial cough/wheezing sensation has completely resolved, will give Pepcid and Benadryl with plan for observation. ? ?On reassessment patient is resting comfortably, continues to have improvement in symptoms and would like to be discharged which seems reasonable as she has had continuous improvement during her hour in the ED. She remains without findings of angioedema/anaphylaxis. Prescriptions for pepcid/benadryl provided, she already has an epi pen at home- we discussed indications for use as well as need to be evaluated in the ED if epi pen was necessary.  We discussed avoidance of red meat, treatment plan, PCP follow up, and strict return precautions.  Provided opportunity for questions, patient confirmed understanding and is in agreement. ? ?Portions of this note were generated with Geographical information systems officer. Dictation errors may occur despite best attempts at proofreading. ? ?Final Clinical Impression(s) / ED Diagnoses ?Final diagnoses:  ?Allergic reaction, initial encounter  ? ? ?Rx / DC Orders ?ED Discharge Orders   ? ?      Ordered  ?  diphenhydrAMINE (BENADRYL) 25 MG tablet  Every 6 hours PRN       ? 09/25/21 2254  ?  famotidine (PEPCID) 20 MG tablet  2 times daily PRN       ? 09/25/21 2254  ? ?  ?  ? ?  ? ? ?  ?Amaryllis Dyke, PA-C ?09/25/21 2301 ? ?  ?Lennice Sites, DO ?09/28/21 [id number] ? ?

## 2021-09-25 NOTE — Discharge Instructions (Addendum)
You were seen in the ER today for an allergic reaction.  ?Please avoid red meat.  ?Take benadry& pepcid as needed for itching/rash.  ?Follow up with primary care as soon as possible.  Return to the emergency department for new or worsening symptoms including but not limited to worsening rash, fever, dizziness, passing out, trouble breathing, sensation of throat closing, or any other concerns. ? ?As discussed if you need to utilize your EpiPen you need to be evaluated in the emergency department. ? ?Additionally your blood pressure was noted to be elevated in the ER today.  Please have this rechecked by primary care. ?

## 2021-09-29 ENCOUNTER — Encounter: Payer: Self-pay | Admitting: Family Medicine

## 2021-09-29 ENCOUNTER — Ambulatory Visit (INDEPENDENT_AMBULATORY_CARE_PROVIDER_SITE_OTHER): Payer: PPO | Admitting: Family Medicine

## 2021-09-29 VITALS — BP 136/86 | HR 79 | Temp 97.9°F | Ht 63.0 in | Wt 155.1 lb

## 2021-09-29 DIAGNOSIS — W57XXXA Bitten or stung by nonvenomous insect and other nonvenomous arthropods, initial encounter: Secondary | ICD-10-CM | POA: Insufficient documentation

## 2021-09-29 DIAGNOSIS — S70362A Insect bite (nonvenomous), left thigh, initial encounter: Secondary | ICD-10-CM

## 2021-09-29 DIAGNOSIS — Z91018 Allergy to other foods: Secondary | ICD-10-CM | POA: Diagnosis not present

## 2021-09-29 HISTORY — DX: Bitten or stung by nonvenomous insect and other nonvenomous arthropods, initial encounter: W57.XXXA

## 2021-09-29 MED ORDER — TRIAMCINOLONE ACETONIDE 0.1 % EX CREA: 1.0000 "application " | TOPICAL_CREAM | Freq: Two times a day (BID) | CUTANEOUS | 0 refills | Status: DC

## 2021-09-29 MED ORDER — DOXYCYCLINE HYCLATE 100 MG PO TABS: 100.0000 mg | ORAL_TABLET | Freq: Two times a day (BID) | ORAL | 0 refills | Status: DC

## 2021-09-29 NOTE — Assessment & Plan Note (Signed)
Discussed alpha gal allergy - which may have worsened/been exacerbated after recent repeat tick bite, s/p ER evaluation after eating ribs.  ?She will continue to avoid red meats at this time. ?Has epi pen, has benadryl and pepcid at home.  ?

## 2021-09-29 NOTE — Progress Notes (Signed)
? ? Patient ID: Nancy [name], female    DOB: [birth year], 66 y.o.   MRN: [id number] ? ?This visit was conducted in person. ? ?BP 136/86   Pulse 79   Temp 97.9 ?F (36.6 ?C) (Temporal)   Ht '5\' 3"'$  (1.6 m)   Wt 155 lb 2 oz (70.4 kg)   SpO2 98%   BMI 27.48 kg/m?   ? ?CC: tick bite ?Subjective:  ? ?HPI: ?Nancy [name] is a 66 y.o. female presenting on 09/29/2021 for Insect Bite (C/o tick bite upper L leg just below hip.  Noticed 09/24/21 and removed.  States area is now red.  ) ? ? ?DOI: 09/24/2021 ?Noted brown tick bite to left upper thigh below hip. She did fully remove tick, but now notes developing surrounding redness.  ? ?No fevers/chills, nausea, abd pain, headache, joint pains, other skin rash.  ? 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 Was getting better - but Friday ate ribs with subsequent development of dry cough progressing to itchy hives. Seen at [facility] ER 09/25/2021, records reviewed.  ?   ? ?Relevant past medical, surgical, family and social history reviewed and updated as indicated. Interim medical history since our last visit reviewed. ?Allergies and medications reviewed and updated. ?Outpatient Medications Prior to Visit  ?Medication Sig Dispense Refill  ? Calcium-Phosphorus-Vitamin D (CALCIUM/D3 ADULT GUMMIES PO) Take by mouth.    ? citalopram (CELEXA) 20 MG tablet Take 1 tablet (20 mg total) by mouth daily. For anxiety. 90 tablet 3  ? clobetasol cream (TEMOVATE) 5.32 % Apply 1 application topically as needed.    ? Cyanocobalamin (B-12) 1000 MCG CAPS Take 1 tablet by mouth daily.    ? diphenhydrAMINE (BENADRYL) 25 MG tablet Take 1 tablet (25 mg total) by mouth every 6 (six) hours as needed (rash, itching). 15 tablet 0  ? EPINEPHrine 0.3 mg/0.3 mL IJ SOAJ injection Inject 0.3 mg into the muscle as needed for anaphylaxis. 1 each 0  ? pravastatin (PRAVACHOL) 20 MG tablet Take 1 tablet (20 mg total) by mouth every evening. 30 tablet 11  ? famotidine (PEPCID) 20 MG tablet Take 1 tablet (20 mg total) by mouth  2 (two) times daily as needed (rash). (Patient not taking: Reported on 09/29/2021) 15 tablet 0  ? metroNIDAZOLE (METROCREAM) 0.75 % cream Apply topically 2 (two) times daily.    ? ?No facility-administered medications prior to visit.  ?  ? ?Per HPI unless specifically indicated in ROS section below ?Review of Systems ? ?Objective:  ?BP 136/86   Pulse 79   Temp 97.9 ?F (36.6 ?C) (Temporal)   Ht '5\' 3"'$  (1.6 m)   Wt 155 lb 2 oz (70.4 kg)   SpO2 98%   BMI 27.48 kg/m?   ?Wt Readings from Last 3 Encounters:  ?09/29/21 155 lb 2 oz (70.4 kg)  ?08/20/21 151 lb (68.5 kg)  ?04/24/21 148 lb (67.1 kg)  ?  ?  ?Physical Exam ?Vitals and nursing note reviewed.  ?Constitutional:   ?   Appearance: Normal appearance. She is not ill-appearing.  ?Cardiovascular:  ?   Rate and Rhythm: Normal rate and regular rhythm.  ?   Pulses: Normal pulses.  ?   Heart sounds: Normal heart sounds. No murmur heard. ?Pulmonary:  ?   Effort: Pulmonary effort is normal. No respiratory distress.  ?   Breath sounds: Normal breath sounds. No wheezing, rhonchi or rales.  ?Musculoskeletal:  ?   Right lower leg: No edema.  ?  Left lower leg: No edema.  ?Skin: ?   General: Skin is warm and dry.  ?   Findings: Rash present.  ?   Comments: Tick bite to L upper anterior thigh with surrounding erythema and excoriations, faint erythematous papules around bite site. No residual tick parts appreciated.   ?Neurological:  ?   Mental Status: She is alert.  ?Psychiatric:     ?   Mood and Affect: Mood normal.     ?   Behavior: Behavior normal.  ? ?   ?Results for orders placed or performed in visit on 08/20/21  ?Alpha-Gal Panel  ?Result Value Ref Range  ? Beef 26.10 (H) kU/L  ? CLASS 4   ? Allergen, Mutton, f88 22.40 (H) kU/L  ? Class 4   ? Allergen, Pork, f26 19.30 (H) kU/L  ? CLASS 4   ? GALACTOSE-ALPHA-1,3-GALACTOSE IGE* CANCELED   ?Lipid panel  ?Result Value Ref Range  ? Cholesterol 233 (H) 0 - 200 mg/dL  ? Triglycerides 103.0 0.0 - 149.0 mg/dL  ? HDL 87.70 >39.00  mg/dL  ? VLDL 20.6 0.0 - 40.0 mg/dL  ? LDL Cholesterol 124 (H) 0 - 99 mg/dL  ? Total CHOL/HDL Ratio 3   ? NonHDL 144.88   ?Hemoglobin A1c  ?Result Value Ref Range  ? Hgb A1c MFr Bld 6.0 4.6 - 6.5 %  ?Comprehensive metabolic panel  ?Result Value Ref Range  ? Sodium 139 135 - 145 mEq/L  ? Potassium 4.6 3.5 - 5.1 mEq/L  ? Chloride 100 96 - 112 mEq/L  ? CO2 31 19 - 32 mEq/L  ? Glucose, Bld 90 70 - 99 mg/dL  ? BUN 13 6 - 23 mg/dL  ? Creatinine, Ser 0.74 0.40 - 1.20 mg/dL  ? Total Bilirubin 0.6 0.2 - 1.2 mg/dL  ? Alkaline Phosphatase 84 39 - 117 U/L  ? AST 20 0 - 37 U/L  ? ALT 13 0 - 35 U/L  ? Total Protein 7.1 6.0 - 8.3 g/dL  ? Albumin 4.5 3.5 - 5.2 g/dL  ? GFR 84.14 >60.00 mL/min  ? Calcium 9.6 8.4 - 10.5 mg/dL  ?CBC  ?Result Value Ref Range  ? WBC 6.6 4.0 - 10.5 K/uL  ? RBC 4.73 3.87 - 5.11 Mil/uL  ? Platelets 258.0 150.0 - 400.0 K/uL  ? Hemoglobin 13.8 12.0 - 15.0 g/dL  ? HCT 41.3 36.0 - 46.0 %  ? MCV 87.3 78.0 - 100.0 fl  ? MCHC 33.4 30.0 - 36.0 g/dL  ? RDW 13.4 11.5 - 15.5 %  ?VITAMIN D 25 Hydroxy (Vit-D Deficiency, Fractures)  ?Result Value Ref Range  ? VITD 42.15 30.00 - 100.00 ng/mL  ?Vitamin B12  ?Result Value Ref Range  ? Vitamin B-12 1,079 (H) 211 - 911 pg/mL  ?Interpretation:  ?Result Value Ref Range  ? Interpretation    ?Galactose-Alpha-1,3-Galactose (Alpha-Gal) IgE  ?Result Value Ref Range  ? GALACTOSE-ALPHA-1,3-GALACTOSE IGE* 59.8 (H) <0.10 kU/L  ? ? ?Assessment & Plan:  ? ?Problem List Items Addressed This Visit   ? ? Allergy to alpha-gal  ?  Discussed alpha gal allergy - which may have worsened/been exacerbated after recent repeat tick bite, s/p ER evaluation after eating ribs.  ?She will continue to avoid red meats at this time. ?Has epi pen, has benadryl and pepcid at home.  ?  ?  ? Tick bite of left thigh - Primary  ?  Tick bite 5d ago with subsequent development of rash around bite site - most  consistent with reaction to tick bite - Rx triamcinolone cream for this.  ?Not consistent with erythema  migrans or RMSF rash. However given associated rash, will cover for tick borne illness with doxycycline 10d course. Reviewed antibiotic precautions including photosensitivity while on med.  ? ?  ?  ?  ? ?Meds ordered this encounter  ?Medications  ? triamcinolone cream (KENALOG) 0.1 %  ?  Sig: Apply 1 application. topically 2 (two) times daily. Apply to AA.  ?  Dispense:  30 g  ?  Refill:  0  ? doxycycline (VIBRA-TABS) 100 MG tablet  ?  Sig: Take 1 tablet (100 mg total) by mouth 2 (two) times daily.  ?  Dispense:  20 tablet  ?  Refill:  0  ? ?No orders of the defined types were placed in this encounter. ? ? ? ?Patient instructions: ?Good to see you today  ?Skin rash looks more like reaction to tick bite rather than infection.  ?Given tick bite and rash, I do want to cover for tick borne illness - take doxycycline course sent to pharmacy. ?May use steroid cream topically for itchy rash around bite.  ?Let us know if not improving with treatment.  ? ?Follow up plan: ?Return if symptoms worsen or fail to improve. ? ?Ria Bush, MD   ?

## 2021-09-29 NOTE — Patient Instructions (Signed)
Good to see you today  ?Skin rash looks more like reaction to tick bite rather than infection.  ?Given tick bite and rash, I do want to cover for tick borne illness - take doxycycline course sent to pharmacy. ?May use steroid cream topically for itchy rash around bite.  ?Let us know if not improving with treatment.  ? ?Tick Bite Information, Adult ?Ticks are insects that draw blood for food. Most ticks live in shrubs and grassy and wooded areas. They climb onto people and animals that brush against the leaves and grasses that they rest on. Then they bite, attaching themselves to the skin. Most ticks are harmless, but some ticks may carry germs that can spread to a person through a bite and cause a disease. To reduce your risk of getting a disease from a tick bite, make sure you: ?Take steps to prevent tick bites. ?Check for ticks after being outdoors where ticks live. ?Watch for symptoms of disease if a tick attached to you or if you suspect a tick bite. ?How can I prevent tick bites? ?Take these steps to help prevent tick bites when you go outdoors in an area where ticks live: ?Use insect repellent ?Use insect repellent that has DEET (20% or higher), picaridin, or IR3535 in it. Follow the instructions on the label. Use these products on: ?Bare skin. ?The top of your boots. ?Your pant legs. ?Your sleeve cuffs. ?For insect repellent that contains permethrin, follow the instructions on the label. Use these products on: ?Clothing. ?Boots. ?Outdoor gear. ?Tents. ?When you are outside ?Wear protective clothing. Long sleeves and long pants offer the best protection from ticks. ?Wear light-colored clothing so you can see ticks more easily. ?Tuck your pant legs into your socks. ?If you go walking on a trail, stay in the middle of the trail so your skin, hair, and clothing do not touch the bushes. ?Avoid walking through areas with long grass. ?Check for ticks on your clothing, hair, and skin often while you are outside, and  check again before you go inside. Make sure to check the scalp, neck, armpits, waist, groin, and joint areas. These are the spots where ticks attach themselves most often. ?When you go indoors ?Check your clothing for ticks. Tumble dry clothes in a dryer on high heat for at least 10 minutes. If clothes are damp, additional time may be needed. If clothes require washing, use hot water. ?Examine gear and pets. ?Shower soon after being outdoors. ?Check your body for ticks. Conduct a full body check using a mirror. ?What is the proper way to remove a tick? ?If you find a tick on your body, remove it as soon as possible. Removing a tick sooner can prevent germs from passing to your body. Do not remove the tick with your bare fingers. To remove a tick that is crawling on your skin but has not bitten, use either of these methods: ?Go outdoors and brush the tick off. ?Remove the tick with tape or a lint roller. ?To remove a tick that is attached to your skin: ?Wash your hands. If you have latex gloves, put them on. ?Use fine-tipped tweezers, curved forceps, or a tick-removal tool to gently grasp the tick as close to your skin and the tick's head as possible. ?Gently pull with a steady, upward, even pressure until the tick lets go. ?When removing the tick: ?Take care to keep the tick's head attached to its body. ?Do not twist or jerk the tick. This can make  the tick's head or mouth parts break off and remain in the skin. ?Do not squeeze or crush the tick's body. This could force disease-carrying fluids from the tick into your body. ?Do not try to remove a tick with heat, alcohol, petroleum jelly, or fingernail polish. Using these methods can cause the tick to salivate and regurgitate into your bloodstream, increasing your risk of getting a disease. ?What should I do after removing a tick? ?Dispose of the tick. Do not crush a tick with your fingers. ?Clean the bite area and your hands with soap and water, rubbing alcohol, or  an iodine scrub. ?If an antiseptic cream or ointment is available, apply a small amount to the bite site. ?Wash and disinfect any instruments that you used to remove the tick. ?How should I dispose of a tick? ?To dispose of a live tick, use one of these methods: ?Place it in rubbing alcohol. ?Place it in a sealed bag or container. ?Wrap it tightly in tape. ?Flush it down the toilet. ?Contact a health care provider if: ?You have symptoms of a disease after a tick bite. Symptoms of a tick-borne disease can occur from moments after the tick bites to 30 days after a tick is removed. Symptoms include: ?Fever or chills. ?Any of these signs in the bite area: ?A red rash that makes a circle (bull's-eye rash) in the bite area. ?Redness and swelling. ?Headache. ?Muscle, joint, or bone pain. ?Abnormal tiredness. ?Numbness in your legs or difficulty walking or moving your legs. ?Tender, swollen lymph glands. ?A part of a tick breaks off and gets stuck in your skin. ?Get help right away if: ?You are not able to remove a tick. ?You experience muscle weakness or paralysis. ?Your symptoms get worse or you experience new symptoms. ?You find an engorged tick on your skin and you are in an area where disease from ticks is a high risk. ?Summary ?Ticks may carry germs that can spread to a person through a bite and cause a disease. ?Wear protective clothing and use insect repellent to prevent tick bites. Follow the instructions on the label. ?If you find a tick on your body, remove it as soon as possible. If the tick is attached, do not try to remove with heat, alcohol, petroleum jelly, or fingernail polish. ?Remove the attached tick using fine-tipped tweezers, curved forceps, or a tick-removal tool. Gently pull with steady, upward, even pressure until the tick lets go. Do not twist or jerk the tick. Do not squeeze or crush the tick's body. ?If you have symptoms of a disease after being bitten by a tick, contact a health care  provider. ?This information is not intended to replace advice given to you by your health care provider. Make sure you discuss any questions you have with your health care provider. ?Document Revised: 05/28/2019 Document Reviewed: 05/28/2019 ?Elsevier Patient Education ? [place]. ? ?

## 2021-09-29 NOTE — Assessment & Plan Note (Signed)
Tick bite 5d ago with subsequent development of rash around bite site - most consistent with reaction to tick bite - Rx triamcinolone cream for this.  ?Not consistent with erythema migrans or RMSF rash. However given associated rash, will cover for tick borne illness with doxycycline 10d course. Reviewed antibiotic precautions including photosensitivity while on med.  ?

## 2021-10-19 DIAGNOSIS — Z6827 Body mass index (BMI) 27.0-27.9, adult: Secondary | ICD-10-CM | POA: Diagnosis not present

## 2021-10-19 DIAGNOSIS — Z01419 Encounter for gynecological examination (general) (routine) without abnormal findings: Secondary | ICD-10-CM | POA: Diagnosis not present

## 2021-10-23 ENCOUNTER — Other Ambulatory Visit: Payer: Self-pay | Admitting: Obstetrics & Gynecology

## 2021-10-23 DIAGNOSIS — D0512 Intraductal carcinoma in situ of left breast: Secondary | ICD-10-CM

## 2021-10-28 ENCOUNTER — Ambulatory Visit
Admission: RE | Admit: 2021-10-28 | Discharge: 2021-10-28 | Disposition: A | Discharge status: Home / Self Care | Payer: PPO | Source: Ambulatory Visit | Attending: Obstetrics & Gynecology | Admitting: Obstetrics & Gynecology

## 2021-10-28 ENCOUNTER — Other Ambulatory Visit: Payer: Self-pay | Admitting: Obstetrics & Gynecology

## 2021-10-28 DIAGNOSIS — R921 Mammographic calcification found on diagnostic imaging of breast: Secondary | ICD-10-CM | POA: Diagnosis not present

## 2021-10-28 DIAGNOSIS — Z853 Personal history of malignant neoplasm of breast: Secondary | ICD-10-CM | POA: Diagnosis not present

## 2021-10-28 DIAGNOSIS — D0512 Intraductal carcinoma in situ of left breast: Secondary | ICD-10-CM

## 2021-10-28 LAB — HM MAMMOGRAPHY

## 2021-11-03 ENCOUNTER — Encounter: Payer: Self-pay | Admitting: Primary Care

## 2021-11-06 ENCOUNTER — Ambulatory Visit
Admission: RE | Admit: 2021-11-06 | Discharge: 2021-11-06 | Disposition: A | Discharge status: Home / Self Care | Payer: PPO | Source: Ambulatory Visit | Attending: Obstetrics & Gynecology | Admitting: Obstetrics & Gynecology

## 2021-11-06 DIAGNOSIS — N6012 Diffuse cystic mastopathy of left breast: Secondary | ICD-10-CM | POA: Diagnosis not present

## 2021-11-06 DIAGNOSIS — R921 Mammographic calcification found on diagnostic imaging of breast: Secondary | ICD-10-CM

## 2022-01-06 ENCOUNTER — Ambulatory Visit (INDEPENDENT_AMBULATORY_CARE_PROVIDER_SITE_OTHER): Payer: PPO

## 2022-01-06 VITALS — Wt 155.0 lb

## 2022-01-06 DIAGNOSIS — Z0000 Encounter for general adult medical examination without abnormal findings: Secondary | ICD-10-CM | POA: Diagnosis not present

## 2022-01-06 NOTE — Patient Instructions (Signed)
Nancy [name] , Thank you for taking time to come for your Medicare Wellness Visit. I appreciate your ongoing commitment to your health goals. Please review the following plan we discussed and let me know if I can assist you in the future.   Screening recommendations/referrals: Colonoscopy: 10/06/20 Mammogram: 11/06/21 Bone Density: 10/21/20 Recommended yearly ophthalmology/optometry visit for glaucoma screening and checkup Recommended yearly dental visit for hygiene and checkup  Vaccinations: Influenza vaccine: n/d Pneumococcal vaccine: 08/20/21 Tdap vaccine: 12/13/15 Shingles vaccine: n/d   Covid-19:08/24/19, 09/18/19  Advanced directives: no  Conditions/risks identified: none  Next appointment: Follow up in one year for your annual wellness visit 01/10/23 @ 8:15 am by phone   Preventive Care 65 Years and Older, Female Preventive care refers to lifestyle choices and visits with your health care provider that can promote health and wellness. What does preventive care include? A yearly physical exam. This is also called an annual well check. Dental exams once or twice a year. Routine eye exams. Ask your health care provider how often you should have your eyes checked. Personal lifestyle choices, including: Daily care of your teeth and gums. Regular physical activity. Eating a healthy diet. Avoiding tobacco and drug use. Limiting alcohol use. Practicing safe sex. Taking low-dose aspirin every day. Taking vitamin and mineral supplements as recommended by your health care provider. What happens during an annual well check? The services and screenings done by your health care provider during your annual well check will depend on your age, overall health, lifestyle risk factors, and family history of disease. Counseling  Your health care provider may ask you questions about your: Alcohol use. Tobacco use. Drug use. Emotional well-being. Home and relationship well-being. Sexual  activity. Eating habits. History of falls. Memory and ability to understand (cognition). Work and work Statistician. Reproductive health. Screening  You may have the following tests or measurements: Height, weight, and BMI. Blood pressure. Lipid and cholesterol levels. These may be checked every 5 years, or more frequently if you are over 25 years old. Skin check. Lung cancer screening. You may have this screening every year starting at age 13 if you have a 30-pack-year history of smoking and currently smoke or have quit within the past 15 years. Fecal occult blood test (FOBT) of the stool. You may have this test every year starting at age 33. Flexible sigmoidoscopy or colonoscopy. You may have a sigmoidoscopy every 5 years or a colonoscopy every 10 years starting at age 60. Hepatitis C blood test. Hepatitis B blood test. Sexually transmitted disease (STD) testing. Diabetes screening. This is done by checking your blood sugar (glucose) after you have not eaten for a while (fasting). You may have this done every 1-3 years. Bone density scan. This is done to screen for osteoporosis. You may have this done starting at age 41. Mammogram. This may be done every 1-2 years. Talk to your health care provider about how often you should have regular mammograms. Talk with your health care provider about your test results, treatment options, and if necessary, the need for more tests. Vaccines  Your health care provider may recommend certain vaccines, such as: Influenza vaccine. This is recommended every year. Tetanus, diphtheria, and acellular pertussis (Tdap, Td) vaccine. You may need a Td booster every 10 years. Zoster vaccine. You may need this after age 71. Pneumococcal 13-valent conjugate (PCV13) vaccine. One dose is recommended after age 86. Pneumococcal polysaccharide (PPSV23) vaccine. One dose is recommended after age 59. Talk to your health care provider  about which screenings and vaccines  you need and how often you need them. This information is not intended to replace advice given to you by your health care provider. Make sure you discuss any questions you have with your health care provider. Document Released: 06/27/2015 Document Revised: 02/18/2016 Document Reviewed: 04/01/2015 Elsevier Interactive Patient Education  2017 [place] Prevention in the Home Falls can cause injuries. They can happen to people of all ages. There are many things you can do to make your home safe and to help prevent falls. What can I do on the outside of my home? Regularly fix the edges of walkways and driveways and fix any cracks. Remove anything that might make you trip as you walk through a door, such as a raised step or threshold. Trim any bushes or trees on the path to your home. Use bright outdoor lighting. Clear any walking paths of anything that might make someone trip, such as rocks or tools. Regularly check to see if handrails are loose or broken. Make sure that both sides of any steps have handrails. Any raised decks and porches should have guardrails on the edges. Have any leaves, snow, or ice cleared regularly. Use sand or salt on walking paths during winter. Clean up any spills in your garage right away. This includes oil or grease spills. What can I do in the bathroom? Use night lights. Install grab bars by the toilet and in the tub and shower. Do not use towel bars as grab bars. Use non-skid mats or decals in the tub or shower. If you need to sit down in the shower, use a plastic, non-slip stool. Keep the floor dry. Clean up any water that spills on the floor as soon as it happens. Remove soap buildup in the tub or shower regularly. Attach bath mats securely with double-sided non-slip rug tape. Do not have throw rugs and other things on the floor that can make you trip. What can I do in the bedroom? Use night lights. Make sure that you have a light by your bed that  is easy to reach. Do not use any sheets or blankets that are too big for your bed. They should not hang down onto the floor. Have a firm chair that has side arms. You can use this for support while you get dressed. Do not have throw rugs and other things on the floor that can make you trip. What can I do in the kitchen? Clean up any spills right away. Avoid walking on wet floors. Keep items that you use a lot in easy-to-reach places. If you need to reach something above you, use a strong step stool that has a grab bar. Keep electrical cords out of the way. Do not use floor polish or wax that makes floors slippery. If you must use wax, use non-skid floor wax. Do not have throw rugs and other things on the floor that can make you trip. What can I do with my stairs? Do not leave any items on the stairs. Make sure that there are handrails on both sides of the stairs and use them. Fix handrails that are broken or loose. Make sure that handrails are as long as the stairways. Check any carpeting to make sure that it is firmly attached to the stairs. Fix any carpet that is loose or worn. Avoid having throw rugs at the top or bottom of the stairs. If you do have throw rugs, attach them to the floor  with carpet tape. Make sure that you have a light switch at the top of the stairs and the bottom of the stairs. If you do not have them, ask someone to add them for you. What else can I do to help prevent falls? Wear shoes that: Do not have high heels. Have rubber bottoms. Are comfortable and fit you well. Are closed at the toe. Do not wear sandals. If you use a stepladder: Make sure that it is fully opened. Do not climb a closed stepladder. Make sure that both sides of the stepladder are locked into place. Ask someone to hold it for you, if possible. Clearly mark and make sure that you can see: Any grab bars or handrails. First and last steps. Where the edge of each step is. Use tools that help you  move around (mobility aids) if they are needed. These include: Canes. Walkers. Scooters. Crutches. Turn on the lights when you go into a dark area. Replace any light bulbs as soon as they burn out. Set up your furniture so you have a clear path. Avoid moving your furniture around. If any of your floors are uneven, fix them. If there are any pets around you, be aware of where they are. Review your medicines with your doctor. Some medicines can make you feel dizzy. This can increase your chance of falling. Ask your doctor what other things that you can do to help prevent falls. This information is not intended to replace advice given to you by your health care provider. Make sure you discuss any questions you have with your health care provider. Document Released: 03/27/2009 Document Revised: 11/06/2015 Document Reviewed: 07/05/2014 Elsevier Interactive Patient Education  2017 [employer].

## 2022-01-06 NOTE — Progress Notes (Signed)
Virtual Visit via Telephone Note  I connected with  Nancy [name] on 01/06/22 at  8:30 AM EDT by telephone and verified that I am speaking with the correct person using two identifiers.  Location: Patient: home Provider: [place] Persons participating in the virtual visit: [place]   I discussed the limitations, risks, security and privacy concerns of performing an evaluation and management service by telephone and the availability of in person appointments. The patient expressed understanding and agreed to proceed.  Interactive audio and video telecommunications were attempted between this nurse and patient, however failed, due to patient having technical difficulties OR patient did not have access to video capability.  We continued and completed visit with audio only.  Some vital signs may be absent or patient reported.   Nancy David, LPN  Subjective:   Nancy [name] is a 66 y.o. female who presents for Medicare Annual (Subsequent) preventive examination.  Review of Systems     Cardiac Risk Factors include: advanced age (>90mn, >>17women)     Objective:    There were no vitals filed for this visit. There is no height or weight on file to calculate BMI.     01/06/2022    8:31 AM 11/14/2020    6:54 AM 11/11/2020   10:41 AM 11/06/2020    4:01 PM 11/06/2020    2:41 PM 10/06/2020    6:59 AM 07/22/2019    9:33 PM  Advanced Directives  Does Patient Have a Medical Advance Directive? No Yes Yes Yes Yes No No  Type of ACorporate treasurerof AWinonaLiving will HSt. Hel[place]Living will [place]Living will [place]reaLiving will    Does patient want to make changes to medical advance directive?  No - Patient declined No - Patient declined  No - Patient declined    Copy of [place]St. Lucie Villagein Chart?  No - copy requested No - copy requested      Would patient like information  on creating a medical advance directive? No - Patient declined     No - Patient declined     Current Medications (verified) Outpatient Encounter Medications as of 01/06/2022  Medication Sig   citalopram (CELEXA) 20 MG tablet Take 1 tablet (20 mg total) by mouth daily. For anxiety.   clobetasol cream (TEMOVATE) 07.74% Apply 1 application topically as needed.   Cyanocobalamin (B-12) 1000 MCG CAPS Take 1 tablet by mouth daily.   diphenhydrAMINE (BENADRYL) 25 MG tablet Take 1 tablet (25 mg total) by mouth every 6 (six) hours as needed (rash, itching).   EPINEPHrine 0.3 mg/0.3 mL IJ SOAJ injection Inject 0.3 mg into the muscle as needed for anaphylaxis.   triamcinolone cream (KENALOG) 0.1 % Apply 1 application. topically 2 (two) times daily. Apply to AA.   Calcium-Phosphorus-Vitamin D (CALCIUM/D3 ADULT GUMMIES PO) Take by mouth. (Patient not taking: Reported on 01/06/2022)   doxycycline (VIBRA-TABS) 100 MG tablet Take 1 tablet (100 mg total) by mouth 2 (two) times daily. (Patient not taking: Reported on 01/06/2022)   famotidine (PEPCID) 20 MG tablet Take 1 tablet (20 mg total) by mouth 2 (two) times daily as needed (rash). (Patient not taking: Reported on 09/29/2021)   PRALUENT 75 MG/ML SOAJ Inject into the skin. (Patient not taking: Reported on 01/06/2022)   pravastatin (PRAVACHOL) 20 MG tablet Take 1 tablet (20 mg total) by mouth every evening. (Patient not taking: Reported on 01/06/2022)  No facility-administered encounter medications on file as of 01/06/2022.    Allergies (verified) Alpha-gal, Macrobid [nitrofurantoin], Atorvastatin, Milk protein, Other, Praluent [alirocumab], Repatha [evolocumab], Rosuvastatin, Shellfish allergy, Simvastatin, Statins, and Zetia [ezetimibe]   History: Past Medical History:  Diagnosis Date   Allergy to alpha-gal    Breast cancer ([place]) 10/23/2020   Chronic neck pain 04/11/2018   Eczema    Essential hypertension 07/20/2019   Fatigue    GAD (generalized anxiety  disorder) 06/26/2018   Hyperlipidemia    Lichen sclerosus    Overweight (BMI 25.0-29.9) 01/09/2018   Recurrent upper respiratory infection (URI)    Routine general medical examination at a health care facility    Urinary frequency 06/06/2019   Urticaria    Vaginal itching 06/06/2019   Past Surgical History:  Procedure Laterality Date   ANTERIOR CERVICAL DECOMP/DISCECTOMY FUSION     BREAST LUMPECTOMY WITH RADIOACTIVE SEED LOCALIZATION Left 11/14/2020   Procedure: LEFT BREAST LUMPECTOMY WITH RADIOACTIVE SEED LOCALIZATION;  Surgeon: Erroll Luna, MD;  Location: [facility];  Service: General;  Laterality: Left;   [place] PROPOFOL N/A 10/06/2020   Procedure: COLONOSCOPY WITH PROPOFOL;  Surgeon: Virgel Manifold, MD;  Location: [facility];  Service: Endoscopy;  Laterality: N/A;   PARTIAL HYSTERECTOMY     TUBAL LIGATION  1985   Family History  Problem Relation Age of Onset   Hydrocephalus Mother    Neuropathy Mother    Diabetes Father    Hypertension Father    Thyroid cancer Cousin    Thyroid disease Maternal Aunt    Social History   Socioeconomic History   Marital status: Single    Spouse name: Not on file   Number of children: 2   Years of education: Not on file   Highest education level: Not on file  Occupational History   Not on file  Tobacco Use   Smoking status: Never   Smokeless tobacco: Never  Vaping Use   Vaping Use: Never used  Substance and Sexual Activity   Alcohol use: Not Currently    Alcohol/week: 0.0 standard drinks of alcohol   Drug use: No   Sexual activity: Not on file  Other Topics Concern   Not on file  Social History Narrative   Divorced.  G3P2.   Regular exercise-yes   Social Determinants of Health   Financial Resource Strain: Low Risk  (01/06/2022)   Overall Financial Resource Strain (CARDIA)    Difficulty of Paying Living Expenses: Not hard at all  Food Insecurity: No  Food Insecurity (01/06/2022)   Hunger Vital Sign    Worried About Running Out of Food in the Last Year: Never true    Ran Out of Food in the Last Year: Never true  Transportation Needs: No Transportation Needs (01/06/2022)   PRAPARE - Hydrologist (Medical): No    Lack of Transportation (Non-Medical): No  Physical Activity: Insufficiently Active (01/06/2022)   Exercise Vital Sign    Days of Exercise per Week: 3 days    Minutes of Exercise per Session: 30 min  Stress: No Stress Concern Present (01/06/2022)   [place]    Feeling of Stress : Not at all  Social Connections: Moderately Isolated (01/06/2022)   Social Connection and Isolation Panel [NHANES]    Frequency of Communication with Friends and Family: More than three times a week  Frequency of Social Gatherings with Friends and Family: Once a week    Attends Religious Services: More than 4 times per year    Active Member of [employer] or Organizations: No    Attends Music therapist: Never    Marital Status: Divorced    Tobacco Counseling Counseling given: Not Answered   Clinical Intake:  Pre-visit preparation completed: Yes  Pain : No/denies pain     Nutritional Risks: None Diabetes: No  How often do you need to have someone help you when you read instructions, pamphlets, or other written materials from your doctor or pharmacy?: 1 - Never  Diabetic?no  Interpreter Needed?: No  Information entered by :: Kirke Shaggy, LPN   Activities of Daily Living    01/06/2022    8:32 AM  In your present state of health, do you have any difficulty performing the following activities:  Hearing? 0  Vision? 0  Difficulty concentrating or making decisions? 0  Walking or climbing stairs? 0  Dressing or bathing? 0  Doing errands, shopping? 0  Preparing Food and eating ? N  Using the Toilet? N  In the past six months, have  you accidently leaked urine? N  Do you have problems with loss of bowel control? N  Managing your Medications? N  Managing your Finances? N  Housekeeping or managing your Housekeeping? N    Patient Care Team: Pleas Koch, NP as PCP - General (Internal Medicine) [name], Wonda Cheng, MD as PCP - Cardiology (Cardiology) Herold Harms (Physician Assistant) Vania Rea, MD as Consulting Physician (Obstetrics and Gynecology) Mauro Kaufmann, RN as Oncology Nurse Navigator Rockwell Germany, RN as Oncology Nurse [place]  Indicate any recent Medical Services you may have received from other than [facility] providers in the past year (date may be approximate).     Assessment:   This is a routine wellness examination for [place].  Hearing/Vision screen Hearing Screening - Comments:: No aids Vision Screening - Comments:: Wears glasses- [facility]  Dietary issues and exercise activities discussed: Current Exercise Habits: Home exercise routine, Type of exercise: walking, Time (Minutes): 30, Frequency (Times/Week): 3, Weekly Exercise (Minutes/Week): 90, Intensity: Mild   Goals Addressed             This Visit's Progress    DIET - EAT MORE FRUITS AND VEGETABLES         Depression Screen    01/06/2022    8:29 AM 08/20/2021    9:07 AM 08/06/2020    8:53 AM 05/26/2020   10:22 AM  PHQ 2/9 Scores  PHQ - 2 Score 0 0 0 0  PHQ- 9 Score 0 0 0 1    Fall Risk    01/06/2022    8:32 AM 08/20/2021    9:07 AM 08/06/2020    8:53 AM 05/26/2020   10:22 AM  Fall Risk   Falls in the past year? 0 0 0 0  Number falls in past yr: 0 0 0 0  Injury with Fall? 0 0 0 0  Risk for fall due to : No Fall Risks     Follow up Falls evaluation completed Falls evaluation completed      FALL RISK PREVENTION PERTAINING TO THE HOME:  Any stairs in or around the home? No  If so, are there any without handrails? No  Home free of loose throw rugs in walkways, pet beds,  electrical cords, etc? Yes  Adequate lighting  in your home to reduce risk of falls? Yes   ASSISTIVE DEVICES UTILIZED TO PREVENT FALLS:  Life alert? No  Use of a cane, walker or w/c? No  Grab bars in the bathroom? Yes  Shower chair or bench in shower? No  Elevated toilet seat or a handicapped toilet? Yes    Cognitive Function:        01/06/2022    8:34 AM  6CIT Screen  What Year? 0 points  What month? 0 points  What time? 0 points  Count back from 20 0 points  Months in reverse 0 points  Repeat phrase 0 points  Total Score 0 points    Immunizations Immunization History  Administered Date(s) Administered   Fluad Quad(high Dose 65+) 05/26/2020   Influenza,inj,Quad PF,6+ Mos 04/11/2018   Moderna Sars-Covid-2 Vaccination 08/24/2019, 09/18/2019   Pneumococcal Conjugate-13 08/06/2020   Pneumococcal Polysaccharide-23 08/20/2021   Tdap 12/13/2015    TDAP status: Up to date  Flu Vaccine status: Declined, Education has been provided regarding the importance of this vaccine but patient still declined. Advised may receive this vaccine at local pharmacy or Health Dept. Aware to provide a copy of the vaccination record if obtained from local pharmacy or Health Dept. Verbalized acceptance and understanding.  Pneumococcal vaccine status: Up to date  Covid-19 vaccine status: Completed vaccines  Qualifies for Shingles Vaccine? Yes   Zostavax completed No   Shingrix Completed?: No.    Education has been provided regarding the importance of this vaccine. Patient has been advised to call insurance company to determine out of pocket expense if they have not yet received this vaccine. Advised may also receive vaccine at local pharmacy or Health Dept. Verbalized acceptance and understanding.  Screening Tests Health Maintenance  Topic Date Due   Zoster Vaccines- Shingrix (1 of 2) Never done   COVID-19 Vaccine (3 - Moderna risk series) 10/16/2019   INFLUENZA VACCINE  01/12/2022    MAMMOGRAM  10/29/2023   TETANUS/TDAP  12/12/2025   COLONOSCOPY (Pts 45-45yr Insurance coverage will need to be confirmed)  10/07/2030   Pneumonia Vaccine 66 Years old  Completed   DEXA SCAN  Completed   Hepatitis C Screening  Completed   HPV VACCINES  Aged Out    Health Maintenance  Health Maintenance Due  Topic Date Due   Zoster Vaccines- Shingrix (1 of 2) Never done   COVID-19 Vaccine (3 - Moderna risk series) 10/16/2019    Colorectal cancer screening: Type of screening: Colonoscopy. Completed 10/06/20. Repeat every 10 years  Mammogram status: Completed 11/06/21. Repeat every year  Bone Density status: Completed 10/21/20. Results reflect: Bone density results: NORMAL. Repeat every 5 years.  Lung Cancer Screening: (Low Dose CT Chest recommended if Age 67-80years, 30 pack-year currently smoking OR have quit w/in 15years.) does not qualify.    Additional Screening:  Hepatitis C Screening: does qualify; Completed 01/09/18  Vision Screening: Recommended annual ophthalmology exams for early detection of glaucoma and other disorders of the eye. Is the patient up to date with their annual eye exam?  Yes  Who is the provider or what is the name of the [facility] in which the patient attends annual [facility] exams[facility] provider, would they like to be referred to a provider to establish care? No .   Dental Screening: Recommended annual dental exams for proper oral hygiene  Community Resource Referral / Chronic Care Management: CRR required this visit?  No  CCM required this visit?  No      Plan:     I have personally reviewed and noted the following in the patient's chart:   Medical and social history Use of alcohol, tobacco or illicit drugs  Current medications and supplements including opioid prescriptions.  Functional ability and status Nutritional status Physical activity Advanced directives List of other physicians Hospitalizations,  surgeries, and ER visits in previous 12 months Vitals Screenings to include cognitive, depression, and falls Referrals and appointments  In addition, I have reviewed and discussed with patient certain preventive protocols, quality metrics, and best practice recommendations. A written personalized care plan for preventive services as well as general preventive health recommendations were provided to patient.     Nancy David, LPN   [id number]/35/[id number]   Nurse Notes: none

## 2022-01-08 DIAGNOSIS — S93401A Sprain of unspecified ligament of right ankle, initial encounter: Secondary | ICD-10-CM | POA: Diagnosis not present

## 2022-01-08 DIAGNOSIS — M79671 Pain in right foot: Secondary | ICD-10-CM | POA: Diagnosis not present

## 2022-01-08 DIAGNOSIS — S96911A Strain of unspecified muscle and tendon at ankle and foot level, right foot, initial encounter: Secondary | ICD-10-CM | POA: Diagnosis not present

## 2022-01-08 DIAGNOSIS — M25571 Pain in right ankle and joints of right foot: Secondary | ICD-10-CM | POA: Diagnosis not present

## 2022-03-22 ENCOUNTER — Telehealth: Payer: Self-pay | Admitting: Primary Care

## 2022-03-22 NOTE — Telephone Encounter (Signed)
Pt called returning a missed call from [place]. Pt was wondering what was the call pertaining to? Call back # [phone]

## 2022-03-22 NOTE — Telephone Encounter (Signed)
Called patient back she declined flu have updated her chart

## 2022-04-20 ENCOUNTER — Other Ambulatory Visit: Payer: Self-pay | Admitting: Obstetrics & Gynecology

## 2022-04-20 DIAGNOSIS — R921 Mammographic calcification found on diagnostic imaging of breast: Secondary | ICD-10-CM

## 2022-05-10 ENCOUNTER — Other Ambulatory Visit: Payer: Self-pay | Admitting: Obstetrics & Gynecology

## 2022-05-10 ENCOUNTER — Ambulatory Visit
Admission: RE | Admit: 2022-05-10 | Discharge: 2022-05-10 | Disposition: A | Discharge status: Home / Self Care | Payer: PPO | Source: Ambulatory Visit | Attending: Obstetrics & Gynecology | Admitting: Obstetrics & Gynecology

## 2022-05-10 DIAGNOSIS — R921 Mammographic calcification found on diagnostic imaging of breast: Secondary | ICD-10-CM

## 2022-05-10 LAB — HM MAMMOGRAPHY

## 2022-05-11 ENCOUNTER — Encounter: Payer: Self-pay | Admitting: Primary Care

## 2022-06-18 ENCOUNTER — Telehealth: Payer: Self-pay | Admitting: Cardiovascular Disease

## 2022-06-18 NOTE — Telephone Encounter (Signed)
Pt c/o of Chest Pain: STAT if CP now or developed within 24 hours  1. Are you having CP right now? No chest pains  2. Are you experiencing any other symptoms (ex. SOB, nausea, vomiting, sweating)? Tingling in left arm  3. How long have you been experiencing CP? Tingling- had an episode about a week or two ago- she  had an episode this morning and still feels numb  4. Is your CP continuous or coming and going?   5. Have you taken Nitroglycerin?  ?

## 2022-06-18 NOTE — Telephone Encounter (Signed)
Patient states that similar episode "a week or two ago" lasted about 15 minutes, she was up and moving doing regular activity. This morning while combing her hair, felt same tingling in [place] that extends from bicep to fingertips. She denies SOB, neck pain, chest pain, N/V/D. States she has spinal stenosis/arthrosis with scar tissue from previous surgeries, so she doesn't feel this is necessarily cardiac related, but thought she should call just in case. Patient is speaking in complete sentences, no slurring, able to read label on box at home with no changes in vision, states her smile in the mirror looks symmetrical. Advised that it doesn't sound like an emergency, keep appt that was just made by operator in case we need to pursue stress testing, but that if sensations began in any other part of body to seek immediate help

## 2022-06-20 ENCOUNTER — Encounter: Payer: Self-pay | Admitting: Physician Assistant

## 2022-06-20 NOTE — Progress Notes (Unsigned)
Cardiology Office Note    Date:  06/21/2022   ID:  Nancy [name], DOB [birth year], MRN [id number]  PCP:  Pleas Koch, NP  Cardiologist:  Mertie Moores, MD  Electrophysiologist:  None   Chief Complaint: left arm tingling  History of Present Illness:   Nancy [name] is a 67 y.o. female with history of HTN, elevated calcium score, aortic atherosclerosis, anxiety, alpha gal allergy, breast CA, lichen sclerosis who is seen for follow-up. She previously established with Dr. Acie Fredrickson for after an episode of face tingling in the setting of severely high blood pressure. Her medications were adjusted. Calcium score 08/2019 CAD 243 (91%ile) with scattered calcifications in LAD, LCX, RCA, + aortic atherosclerosis otherwise overread OK. She subsequently followed with the pharmD clinic for HLD - had prior intolerances to atorvastatin '20mg'$  daily, rosuvastatin '10mg'$  daily and '5mg'$  2x per week, simvastatin '20mg'$  daily, ezetimibe '10mg'$  daily. She previously had back aches, sore throat, headaches with Repatha and was changed to Praluent but then stopped this as she reported anxiety and tremors and was also concerned about her alpha gal since Praluent is derived from [place] hamster ovary cells. She had lower than expected efficacy with Repatha. Per pharmD note 05/2021, "Discussed that mouse and hamster ovarian cells are used to mass produce the fully humanized monoclonal antibodies in PCSK9i therapy. Since the monoclonal antibodies in Praluent are derived from [place] hamster protein, there is likely low amount of mammal protein in the parts per million in [place]. When PharmD reached out to [place] [facility] last year to inquire about this, they reported that all materials used in the production of Praluent were evaluated and found to be of non-animal origin and that pt should be safe to continue on Praluent. Pt also previously tolerated Praluent without signs of allergic reaction in the 4-5 months she was on therapy after  her alpha gal dx." She was subsequently restarted on Praluent - LDL went from 183 to 124 in follow-up. However, she has since stopped Praluent as it caused recurrent flu-like symptoms.  She is seen for follow-up today for evaluation of left arm parasthesias. Around Christmastime she reports she had 2 separate episodes where her left arm began tingling all the way down without provocation. It resolved without intervention. She had no associated neurologic symptoms otherwise like facial asymmetry, speech difficulty or focal weakness. Each episode lasted about 10 minutes. It was not associated with any specific position. She does report a prior history of spinal issues and wonders if this was contributing. She also wonders if gluten was causing her symptoms as she typically eats gluten free. She also reports a longstanding hx of atypical chest pain in a focal area over her chest wall that began after a horse accident several years ago. This does get aggravated after she does upper body activity. However, in general she is able to exert herself without exertional angina or dyspnea. She feels well today.  Labwork independently reviewed: 08/2021 CBC wnl, CMET wnl K 4.6 Cr 0.74, A1C 6.0, trig 103, LDL 124 2021 TSH OK  Past History   Past Medical History:  Diagnosis Date   Allergy to alpha-gal    Breast cancer ([place]) 10/23/2020   Chronic neck pain 04/11/2018   Coronary artery calcification seen on CAT scan    Eczema    Essential hypertension 07/20/2019   Fatigue    GAD (generalized anxiety disorder) 06/26/2018   Hyperlipidemia    Lichen sclerosus    Overweight (BMI 25.0-29.9) 01/09/2018  Recurrent upper respiratory infection (URI)    Routine general medical examination at a health care facility    Urinary frequency 06/06/2019   Urticaria    Vaginal itching 06/06/2019    Past Surgical History:  Procedure Laterality Date   ANTERIOR CERVICAL DECOMP/DISCECTOMY FUSION     BREAST LUMPECTOMY WITH  RADIOACTIVE SEED LOCALIZATION Left 11/14/2020   Procedure: LEFT BREAST LUMPECTOMY WITH RADIOACTIVE SEED LOCALIZATION;  Surgeon: Erroll Luna, MD;  Location: [facility];  Service: General;  Laterality: Left;   [place] PROPOFOL N/A 10/06/2020   Procedure: COLONOSCOPY WITH PROPOFOL;  Surgeon: Virgel Manifold, MD;  Location: [facility] ENDOSCOPY;  Service: Endoscopy;  Laterality: N/A;   PARTIAL HYSTERECTOMY     TUBAL LIGATION  1985    Current Medications: Current Meds  Medication Sig   Cholecalciferol (VITAMIN D-3 PO) Take 1 tablet by mouth 3 (three) times a week.   citalopram (CELEXA) 20 MG tablet Take 1 tablet (20 mg total) by mouth daily. For anxiety.   clobetasol cream (TEMOVATE) 3.15 % Apply 1 application topically as needed.   Cyanocobalamin (B-12) 1000 MCG CAPS Take 1 tablet by mouth 3 (three) times a week.   diphenhydrAMINE (BENADRYL) 25 MG tablet Take 1 tablet (25 mg total) by mouth every 6 (six) hours as needed (rash, itching).   EPINEPHrine 0.3 mg/0.3 mL IJ SOAJ injection Inject 0.3 mg into the muscle as needed for anaphylaxis.      Allergies:   Alpha-gal, Macrobid [nitrofurantoin], Atorvastatin, Milk protein, Other, Praluent [alirocumab], Repatha [evolocumab], Rosuvastatin, Shellfish allergy, Simvastatin, Statins, and Zetia [ezetimibe]   Social History   Socioeconomic History   Marital status: Single    Spouse name: Not on file   Number of children: 2   Years of education: Not on file   Highest education level: Not on file  Occupational History   Not on file  Tobacco Use   Smoking status: Never   Smokeless tobacco: Never  Vaping Use   Vaping Use: Never used  Substance and Sexual Activity   Alcohol use: Not Currently    Alcohol/week: 0.0 standard drinks of alcohol   Drug use: No   Sexual activity: Not on file  Other Topics Concern   Not on file  Social History Narrative   Divorced.  G3P2.   Regular  exercise-yes   Social Determinants of Health   Financial Resource Strain: Low Risk  (01/06/2022)   Overall Financial Resource Strain (CARDIA)    Difficulty of Paying Living Expenses: Not hard at all  Food Insecurity: No Food Insecurity (01/06/2022)   Hunger Vital Sign    Worried About Running Out of Food in the Last Year: Never true    Ran Out of Food in the Last Year: Never true  Transportation Needs: No Transportation Needs (01/06/2022)   PRAPARE - Hydrologist (Medical): No    Lack of Transportation (Non-Medical): No  Physical Activity: Insufficiently Active (01/06/2022)   Exercise Vital Sign    Days of Exercise per Week: 3 days    Minutes of Exercise per Session: 30 min  Stress: No Stress Concern Present (01/06/2022)   [place]    Feeling of Stress : Not at all  Social Connections: Moderately Isolated (01/06/2022)   Social Connection and Isolation Panel [NHANES]    Frequency of Communication with Friends and Family: More than three times  a week    Frequency of Social Gatherings with Friends and Family: Once a week    Attends Religious Services: More than 4 times per year    Active Member of [employer] or Organizations: No    Attends Music therapist: Never    Marital Status: Divorced     Family History:  The patient's family history includes Diabetes in her father; Hydrocephalus in her mother; Hypertension in her father; Neuropathy in her mother; Thyroid cancer in her cousin; Thyroid disease in her maternal aunt.  ROS:   Please see the history of present illness.  All other systems are reviewed and otherwise negative.    EKG(s)/Additional Testing   EKG:  EKG is ordered today, personally reviewed, demonstrating NSR 71bpm, nonspecific ST-TW changes inferiorly, V5-V6 similar to prior.  CV Studies: Cardiac studies reviewed are outlined and summarized above. Otherwise please  see EMR for full report.  Recent Labs: 08/20/2021: ALT 13; BUN 13; Creatinine, Ser 0.74; Hemoglobin 13.8; Platelets 258.0; Potassium 4.6; Sodium 139  Recent Lipid Panel    Component Value Date/Time   CHOL 233 (H) 08/20/2021 0943   CHOL 267 (H) 05/05/2021 0845   TRIG 103.0 08/20/2021 0943   HDL 87.70 08/20/2021 0943   HDL 66 05/05/2021 0845   CHOLHDL 3 08/20/2021 0943   VLDL 20.6 08/20/2021 0943   LDLCALC 124 (H) 08/20/2021 0943   LDLCALC 183 (H) 05/05/2021 0845   LDLDIRECT 197.5 11/02/2011 0819    PHYSICAL EXAM:    VS:  BP (!) 144/88   Pulse 71   Ht '5\' 3"'$  (1.6 m)   Wt 149 lb (67.6 kg)   SpO2 97%   BMI 26.39 kg/m   BMI: Body mass index is 26.39 kg/m.  GEN: Well nourished, well developed female in no acute distress HEENT: normocephalic, atraumatic Neck: no JVD, carotid bruits, or masses Cardiac: RRR; no murmurs, rubs, or gallops, no edema  Respiratory:  clear to auscultation bilaterally, normal work of breathing GI: soft, nontender, nondistended, + BS MS: no deformity or atrophy Skin: warm and dry, no rash Neuro:  Alert and Oriented x 3, Strength and sensation are intact, follows commands Psych: euthymic mood, full affect  Wt Readings from Last 3 Encounters:  06/21/22 149 lb (67.6 kg)  01/06/22 155 lb (70.3 kg)  09/29/21 155 lb 2 oz (70.4 kg)     ASSESSMENT & PLAN:   1. Left arm parasthesias - short lived, 2 episodes x 10 minutes each. Difficult to know what these represent. Does have a hx of spinal issues which she wonders if could be contributing. She had not had any acute focal neurologic changes otherwise but certainly has risk factors for cardiovascular disease. We will check carotid duplex and echocardiogram as well as basic labs today. I also encouraged her to f/u with primary care to determine whether any additional neurologic/orthopedic imaging would be warranted.  2. Coronary calcification, aortic atherosclerosis, atypical chest pain - given left arm symptoms  as well as chronic atypical chest pain, high calcium score on 2021 CT with continued uncontrolled HLD, have recommended coronary CTA for more definitive evaluation. She does report tolerating metoprolol in the past which will be given before the study.  3. Hyperlipidemia - intolerant to statins, PCSK9is as above. She is agreeable to referral back to pharmD to discuss alternative options (bempedoic acid vs inclisirin?). Check CMET/lipid profile today.  4. Essential HTN - Suboptimal blood pressure control noted today. We need a better idea of what it's running  at home. The patient was provided instructions on monitoring blood pressure at home for 1 week and relaying results to our office. Consideration could be given to carvedilol if needed as she previously did tolerate metoprolol. She did not tolerate lisinopril in the past due to headaches. Given CV disease, I told her to notify our office if she is seeing readings 130 or above at home.  HYPERTENSION CONTROL Vitals:   06/21/22 1117 06/21/22 1157  BP: (!) 144/88 (!) 144/70    The patient's blood pressure is elevated above target today.  In order to address the patient's elevated BP: Blood pressure will be monitored at home to determine if medication changes need to be made.      Disposition: F/u with me in 6 weeks.   Medication Adjustments/Labs and Tests Ordered: Current medicines are reviewed at length with the patient today.  Concerns regarding medicines are outlined above. Medication changes, Labs and Tests ordered today are summarized above and listed in the Patient Instructions accessible in Encounters.   Signed, Charlie Pitter, PA-C  06/21/2022 11:32 AM    [place] Phone: [phone]; Fax: [phone]

## 2022-06-20 NOTE — H&P (View-Only) (Signed)
Cardiology Office Note    Date:  06/21/2022   ID:  Nancy [name], DOB 29-Jan-1956, MRN [id number]  PCP:  Pleas Koch, NP  Cardiologist:  Mertie Moores, MD  Electrophysiologist:  None   Chief Complaint: left arm tingling  History of Present Illness:   Nancy [name] is a 67 y.o. female with history of HTN, elevated calcium score, aortic atherosclerosis, anxiety, alpha gal allergy, breast CA, lichen sclerosis who is seen for follow-up. She previously established with Dr. Acie Fredrickson for after an episode of face tingling in the setting of severely high blood pressure. Her medications were adjusted. Calcium score 08/2019 CAD 243 (91%ile) with scattered calcifications in LAD, LCX, RCA, + aortic atherosclerosis otherwise overread OK. She subsequently followed with the pharmD clinic for HLD - had prior intolerances to atorvastatin '20mg'$  daily, rosuvastatin '10mg'$  daily and '5mg'$  2x per week, simvastatin '20mg'$  daily, ezetimibe '10mg'$  daily. She previously had back aches, sore throat, headaches with Repatha and was changed to Praluent but then stopped this as she reported anxiety and tremors and was also concerned about her alpha gal since Praluent is derived from [place] hamster ovary cells. She had lower than expected efficacy with Repatha. Per pharmD note 05/2021, "Discussed that mouse and hamster ovarian cells are used to mass produce the fully humanized monoclonal antibodies in PCSK9i therapy. Since the monoclonal antibodies in Praluent are derived from [place] hamster protein, there is likely low amount of mammal protein in the parts per million in [place]. When PharmD reached out to [place] last year to inquire about this, they reported that all materials used in the production of Praluent were evaluated and found to be of non-animal origin and that pt should be safe to continue on Praluent. Pt also previously tolerated Praluent without signs of allergic reaction in the 4-5 months she was on therapy after  her alpha gal dx." She was subsequently restarted on Praluent - LDL went from 183 to 124 in follow-up. However, she has since stopped Praluent as it caused recurrent flu-like symptoms.  She is seen for follow-up today for evaluation of left arm parasthesias. Around Christmastime she reports she had 2 separate episodes where her left arm began tingling all the way down without provocation. It resolved without intervention. She had no associated neurologic symptoms otherwise like facial asymmetry, speech difficulty or focal weakness. Each episode lasted about 10 minutes. It was not associated with any specific position. She does report a prior history of spinal issues and wonders if this was contributing. She also wonders if gluten was causing her symptoms as she typically eats gluten free. She also reports a longstanding hx of atypical chest pain in a focal area over her chest wall that began after a horse accident several years ago. This does get aggravated after she does upper body activity. However, in general she is able to exert herself without exertional angina or dyspnea. She feels well today.  Labwork independently reviewed: 08/2021 CBC wnl, CMET wnl K 4.6 Cr 0.74, A1C 6.0, trig 103, LDL 124 2021 TSH [place]  Past History   Past Medical History:  Diagnosis Date   Allergy to alpha-gal    Breast cancer ([facility]) 10/23/2020   Chronic neck pain 04/11/2018   Coronary artery calcification seen on CAT scan    Eczema    Essential hypertension 07/20/2019   Fatigue    GAD (generalized anxiety disorder) 06/26/2018   Hyperlipidemia    Lichen sclerosus    Overweight (BMI 25.0-29.9) 01/09/2018  Recurrent upper respiratory infection (URI)    Routine general medical examination at a health care facility    Urinary frequency 06/06/2019   Urticaria    Vaginal itching 06/06/2019    Past Surgical History:  Procedure Laterality Date   ANTERIOR CERVICAL DECOMP/DISCECTOMY FUSION     BREAST LUMPECTOMY WITH  RADIOACTIVE SEED LOCALIZATION Left 11/14/2020   Procedure: LEFT BREAST LUMPECTOMY WITH RADIOACTIVE SEED LOCALIZATION;  Surgeon: Erroll Luna, MD;  Location: [facility];  Service: General;  Laterality: Left;   [place] PROPOFOL N/A 10/06/2020   Procedure: COLONOSCOPY WITH PROPOFOL;  Surgeon: Virgel Manifold, MD;  Location: [facility];  Service: Endoscopy;  Laterality: N/A;   PARTIAL HYSTERECTOMY     TUBAL LIGATION  1985    Current Medications: Current Meds  Medication Sig   Cholecalciferol (VITAMIN D-3 PO) Take 1 tablet by mouth 3 (three) times a week.   citalopram (CELEXA) 20 MG tablet Take 1 tablet (20 mg total) by mouth daily. For anxiety.   clobetasol cream (TEMOVATE) 2.03 % Apply 1 application topically as needed.   Cyanocobalamin (B-12) 1000 MCG CAPS Take 1 tablet by mouth 3 (three) times a week.   diphenhydrAMINE (BENADRYL) 25 MG tablet Take 1 tablet (25 mg total) by mouth every 6 (six) hours as needed (rash, itching).   EPINEPHrine 0.3 mg/0.3 mL IJ SOAJ injection Inject 0.3 mg into the muscle as needed for anaphylaxis.      Allergies:   Alpha-gal, Macrobid [nitrofurantoin], Atorvastatin, Milk protein, Other, Praluent [alirocumab], Repatha [evolocumab], Rosuvastatin, Shellfish allergy, Simvastatin, Statins, and Zetia [ezetimibe]   Social History   Socioeconomic History   Marital status: Single    Spouse name: Not on file   Number of children: 2   Years of education: Not on file   Highest education level: Not on file  Occupational History   Not on file  Tobacco Use   Smoking status: Never   Smokeless tobacco: Never  Vaping Use   Vaping Use: Never used  Substance and Sexual Activity   Alcohol use: Not Currently    Alcohol/week: 0.0 standard drinks of alcohol   Drug use: No   Sexual activity: Not on file  Other Topics Concern   Not on file  Social History Narrative   Divorced.  G3P2.   Regular  exercise-yes   Social Determinants of Health   Financial Resource Strain: Low Risk  (01/06/2022)   Overall Financial Resource Strain (CARDIA)    Difficulty of Paying Living Expenses: Not hard at all  Food Insecurity: No Food Insecurity (01/06/2022)   Hunger Vital Sign    Worried About Running Out of Food in the Last Year: Never true    Ran Out of Food in the Last Year: Never true  Transportation Needs: No Transportation Needs (01/06/2022)   PRAPARE - Hydrologist (Medical): No    Lack of Transportation (Non-Medical): No  Physical Activity: Insufficiently Active (01/06/2022)   Exercise Vital Sign    Days of Exercise per Week: 3 days    Minutes of Exercise per Session: 30 min  Stress: No Stress Concern Present (01/06/2022)   [place]    Feeling of Stress : Not at all  Social Connections: Moderately Isolated (01/06/2022)   Social Connection and Isolation Panel [NHANES]    Frequency of Communication with Friends and Family: More than three times  a week    Frequency of Social Gatherings with Friends and Family: Once a week    Attends Religious Services: More than 4 times per year    Active Member of [employer] or Organizations: No    Attends Music therapist: Never    Marital Status: Divorced     Family History:  The patient's family history includes Diabetes in her father; Hydrocephalus in her mother; Hypertension in her father; Neuropathy in her mother; Thyroid cancer in her cousin; Thyroid disease in her maternal aunt.  ROS:   Please see the history of present illness.  All other systems are reviewed and otherwise negative.    EKG(s)/Additional Testing   EKG:  EKG is ordered today, personally reviewed, demonstrating NSR 71bpm, nonspecific ST-TW changes inferiorly, V5-V6 similar to prior.  CV Studies: Cardiac studies reviewed are outlined and summarized above. Otherwise please  see EMR for full report.  Recent Labs: 08/20/2021: ALT 13; BUN 13; Creatinine, Ser 0.74; Hemoglobin 13.8; Platelets 258.0; Potassium 4.6; Sodium 139  Recent Lipid Panel    Component Value Date/Time   CHOL 233 (H) 08/20/2021 0943   CHOL 267 (H) 05/05/2021 0845   TRIG 103.0 08/20/2021 0943   HDL 87.70 08/20/2021 0943   HDL 66 05/05/2021 0845   CHOLHDL 3 08/20/2021 0943   VLDL 20.6 08/20/2021 0943   LDLCALC 124 (H) 08/20/2021 0943   LDLCALC 183 (H) 05/05/2021 0845   LDLDIRECT 197.5 11/02/2011 0819    PHYSICAL EXAM:    VS:  BP (!) 144/88   Pulse 71   Ht '5\' 3"'$  (1.6 m)   Wt 149 lb (67.6 kg)   SpO2 97%   BMI 26.39 kg/m   BMI: Body mass index is 26.39 kg/m.  GEN: Well nourished, well developed female in no acute distress HEENT: normocephalic, atraumatic Neck: no JVD, carotid bruits, or masses Cardiac: RRR; no murmurs, rubs, or gallops, no edema  Respiratory:  clear to auscultation bilaterally, normal work of breathing GI: soft, nontender, nondistended, + BS MS: no deformity or atrophy Skin: warm and dry, no rash Neuro:  Alert and Oriented x 3, Strength and sensation are intact, follows commands Psych: euthymic mood, full affect  Wt Readings from Last 3 Encounters:  06/21/22 149 lb (67.6 kg)  01/06/22 155 lb (70.3 kg)  09/29/21 155 lb 2 oz (70.4 kg)     ASSESSMENT & PLAN:   1. Left arm parasthesias - short lived, 2 episodes x 10 minutes each. Difficult to know what these represent. Does have a hx of spinal issues which she wonders if could be contributing. She had not had any acute focal neurologic changes otherwise but certainly has risk factors for cardiovascular disease. We will check carotid duplex and echocardiogram as well as basic labs today. I also encouraged her to f/u with primary care to determine whether any additional neurologic/orthopedic imaging would be warranted.  2. Coronary calcification, aortic atherosclerosis, atypical chest pain - given left arm symptoms  as well as chronic atypical chest pain, high calcium score on 2021 CT with continued uncontrolled HLD, have recommended coronary CTA for more definitive evaluation. She does report tolerating metoprolol in the past which will be given before the study.  3. Hyperlipidemia - intolerant to statins, PCSK9is as above. She is agreeable to referral back to pharmD to discuss alternative options (bempedoic acid vs inclisirin?). Check CMET/lipid profile today.  4. Essential HTN - Suboptimal blood pressure control noted today. We need a better idea of what it's running  at home. The patient was provided instructions on monitoring blood pressure at home for 1 week and relaying results to our office. Consideration could be given to carvedilol if needed as she previously did tolerate metoprolol. She did not tolerate lisinopril in the past due to headaches. Given CV disease, I told her to notify our office if she is seeing readings 130 or above at home.  HYPERTENSION CONTROL Vitals:   06/21/22 1117 06/21/22 1157  BP: (!) 144/88 (!) 144/70    The patient's blood pressure is elevated above target today.  In order to address the patient's elevated BP: Blood pressure will be monitored at home to determine if medication changes need to be made.      Disposition: F/u with me in 6 weeks.   Medication Adjustments/Labs and Tests Ordered: Current medicines are reviewed at length with the patient today.  Concerns regarding medicines are outlined above. Medication changes, Labs and Tests ordered today are summarized above and listed in the Patient Instructions accessible in Encounters.   Signed, Charlie Pitter, PA-C  06/21/2022 11:32 AM    [place] Phone: [phone]; Fax: [phone]

## 2022-06-21 ENCOUNTER — Ambulatory Visit: Payer: PPO | Attending: Physician Assistant | Admitting: Physician Assistant

## 2022-06-21 ENCOUNTER — Encounter: Payer: Self-pay | Admitting: Physician Assistant

## 2022-06-21 VITALS — BP 144/70 | HR 71 | Ht 63.0 in | Wt 149.0 lb

## 2022-06-21 DIAGNOSIS — R072 Precordial pain: Secondary | ICD-10-CM | POA: Diagnosis not present

## 2022-06-21 DIAGNOSIS — R202 Paresthesia of skin: Secondary | ICD-10-CM | POA: Diagnosis not present

## 2022-06-21 DIAGNOSIS — I2584 Coronary atherosclerosis due to calcified coronary lesion: Secondary | ICD-10-CM

## 2022-06-21 DIAGNOSIS — I2510 Atherosclerotic heart disease of native coronary artery without angina pectoris: Secondary | ICD-10-CM | POA: Diagnosis not present

## 2022-06-21 DIAGNOSIS — R0789 Other chest pain: Secondary | ICD-10-CM

## 2022-06-21 DIAGNOSIS — I10 Essential (primary) hypertension: Secondary | ICD-10-CM

## 2022-06-21 DIAGNOSIS — E785 Hyperlipidemia, unspecified: Secondary | ICD-10-CM

## 2022-06-21 DIAGNOSIS — I700 Atherosclerosis of aorta: Secondary | ICD-10-CM

## 2022-06-21 MED ORDER — METOPROLOL TARTRATE 100 MG PO TABS: ORAL_TABLET | ORAL | 0 refills | Status: DC

## 2022-06-21 NOTE — Patient Instructions (Addendum)
Medication Instructions:  Your physician recommends that you continue on your current medications as directed. Please refer to the Current Medication list given to you today.  *If you need a refill on your cardiac medications before your next appointment, please call your pharmacy*   Lab Work: CMET, Mag, Lipids, CBC and TSH today If you have labs (blood work) drawn today and your tests are completely normal, you will receive your results only by: [place] (if you have MyChart) OR A paper copy in the mail If you have any lab test that is abnormal or we need to change your treatment, we will call you to review the results.   Testing/Procedures: Your physician has requested that you have an echocardiogram. Echocardiography is a painless test that uses sound waves to create images of your heart. It provides your doctor with information about the size and shape of your heart and how well your heart's chambers and valves are working. This procedure takes approximately one hour. There are no restrictions for this procedure. Please do NOT wear cologne, perfume, aftershave, or lotions (deodorant is allowed). Please arrive 15 minutes prior to your appointment time.    Your physician has requested that you have a carotid duplex. This test is an ultrasound of the carotid arteries in your neck. It looks at blood flow through these arteries that supply the brain with blood. Allow one hour for this exam. There are no restrictions or special instructions.     Your cardiac CT will be scheduled at one of the below locations:   [facility] [address] [place], [place] [address] (336) [place] [address] [place], [place] [address] [phone]  [facility] [place], [place] [address] [phone]  If scheduled at [facility], please arrive at the [facility]  and Children's Entrance (Entrance C2) of [facility] 30 minutes prior to test start time. You can use the FREE valet parking offered at entrance C (encouraged to control the heart rate for the test)  Proceed to the [facility] Radiology Department (first floor) to check-in and test prep.  All radiology patients and guests should use entrance C2 at [facility], accessed from [facility], even though the hospital's physical address listed is [address].    If scheduled at [facility], please arrive 15 mins early for check-in and test prep.   Please follow these instructions carefully (unless otherwise directed):  Hold all erectile dysfunction medications at least 3 days (72 hrs) prior to test. (Ie viagra, cialis, sildenafil, tadalafil, etc) We will administer nitroglycerin during this exam.   On the Night Before the Test: Be sure to Drink plenty of water. Do not consume any caffeinated/decaffeinated beverages or chocolate 12 hours prior to your test. Do not take any antihistamines 12 hours prior to your test. If the patient has contrast allergy: Patient will need a prescription for Prednisone and very clear instructions (as follows): Prednisone 50 mg - take 13 hours prior to test Take another Prednisone 50 mg 7 hours prior to test Take another Prednisone 50 mg 1 hour prior to test Take Benadryl 50 mg 1 hour prior to test Patient must complete all four doses of above prophylactic medications. Patient will need a ride after test due to Benadryl.  On the Day of the Test: Drink plenty  of water until 1 hour prior to the test. Do not eat any food 1 hour prior to test. You may take your regular medications prior to the test.  Take metoprolol (Lopressor) 100 mg two hours prior to test. HOLD Furosemide/Hydrochlorothiazide morning of the test. FEMALES- please wear underwire-free bra if  available, avoid dresses & tight clothing    After the Test: Drink plenty of water. After receiving IV contrast, you may experience a mild flushed feeling. This is normal. On occasion, you may experience a mild rash up to 24 hours after the test. This is not dangerous. If this occurs, you can take Benadryl 25 mg and increase your fluid intake. If you experience trouble breathing, this can be serious. If it is severe call 911 IMMEDIATELY. If it is mild, please call our office. If you take any of these medications: Glipizide/Metformin, Avandament, Glucavance, please do not take 48 hours after completing test unless otherwise instructed.  We will call to schedule your test 2-4 weeks out understanding that some insurance companies will need an authorization prior to the service being performed.   For non-scheduling related questions, please contact the cardiac imaging nurse navigator should you have any questions/concerns: Marchia Bond, Cardiac Imaging Nurse Navigator Gordy Clement, Cardiac Imaging Nurse Navigator [facility] Direct Office Dial: [phone]   For scheduling needs, including cancellations and rescheduling, please call [place], [phone].   Follow-Up: At [facility], you and your health needs are our priority.  As part of our continuing mission to provide you with exceptional heart care, we have created designated Provider Care Teams.  These Care Teams include your primary Cardiologist (physician) and Advanced Practice Providers (APPs -  Physician Assistants and Nurse Practitioners) who all work together to provide you with the care you need, when you need it.  Your next appointment:   6 week(s)  The format for your next appointment:   In Person  Provider:   Melina Copa, PA-C        Schedule a follow up appointment with the pharmacist here in our office for lipids.  Other Instructions We need to get a better idea of what your  blood pressure is running at home. Here are some instructions to follow: - I would recommend using a blood pressure cuff that goes on your arm. The wrist ones can be inaccurate. If you're purchasing one for the first time, try to select one that also reports your heart rate because this can be helpful information as well. - To check your blood pressure, choose a time at least 3 hours after taking your blood pressure medicines. If you can sample it at different times of the day, that's great - it might give you more information about how your blood pressure fluctuates. Remain seated in a chair for 5 minutes quietly beforehand, then check it.  - Please record a list of those readings.Call our office if you tend to get readings of greater than 130 on the top number or 80 on the bottom number.  I would also like you to see your primary care provider about the left arm discomfort in case they feel additional neurologic/orthopedic imaging is felt necessary.  Important Information About Sugar

## 2022-06-22 ENCOUNTER — Telehealth (HOSPITAL_COMMUNITY): Payer: Self-pay | Admitting: *Deleted

## 2022-06-22 ENCOUNTER — Other Ambulatory Visit (HOSPITAL_COMMUNITY): Payer: Self-pay | Admitting: *Deleted

## 2022-06-22 ENCOUNTER — Encounter (HOSPITAL_COMMUNITY): Payer: Self-pay

## 2022-06-22 LAB — COMPREHENSIVE METABOLIC PANEL
ALT: 11 IU/L (ref 0–32)
AST: 17 IU/L (ref 0–40)
Albumin/Globulin Ratio: 1.9 (ref 1.2–2.2)
Albumin: 4.8 g/dL (ref 3.9–4.9)
Alkaline Phosphatase: 109 IU/L (ref 44–121)
BUN/Creatinine Ratio: 12 (ref 12–28)
BUN: 9 mg/dL (ref 8–27)
Bilirubin Total: 0.7 mg/dL (ref 0.0–1.2)
CO2: 25 mmol/L (ref 20–29)
Calcium: 9.9 mg/dL (ref 8.7–10.3)
Chloride: 98 mmol/L (ref 96–106)
Creatinine, Ser: 0.77 mg/dL (ref 0.57–1.00)
Globulin, Total: 2.5 g/dL (ref 1.5–4.5)
Glucose: 94 mg/dL (ref 70–99)
Potassium: 4.5 mmol/L (ref 3.5–5.2)
Sodium: 140 mmol/L (ref 134–144)
Total Protein: 7.3 g/dL (ref 6.0–8.5)
eGFR: 84 mL/min/{1.73_m2} (ref >59)

## 2022-06-22 LAB — MAGNESIUM: Magnesium: 2.3 mg/dL (ref 1.6–2.3)

## 2022-06-22 LAB — LIPID PANEL
Chol/HDL Ratio: 4.4 ratio (ref 0.0–4.4)
Cholesterol, Total: 303 mg/dL — ABNORMAL HIGH (ref 100–199)
HDL: 69 mg/dL (ref >39)
LDL Chol Calc (NIH): 211 mg/dL — ABNORMAL HIGH (ref 0–99)
Triglycerides: 130 mg/dL (ref 0–149)
VLDL Cholesterol Cal: 23 mg/dL (ref 5–40)

## 2022-06-22 LAB — CBC
Hematocrit: 41.6 % (ref 34.0–46.6)
Hemoglobin: 14.1 g/dL (ref 11.1–15.9)
MCH: 29.9 pg (ref 26.6–33.0)
MCHC: 33.9 g/dL (ref 31.5–35.7)
MCV: 88 fL (ref 79–97)
Platelets: 244 10*3/uL (ref 150–450)
RBC: 4.72 x10E6/uL (ref 3.77–5.28)
RDW: 12.2 % (ref 11.7–15.4)
WBC: 8.5 10*3/uL (ref 3.4–10.8)

## 2022-06-22 LAB — TSH: TSH: 1.01 u[IU]/mL (ref 0.450–4.500)

## 2022-06-22 MED ORDER — PREDNISONE 50 MG PO TABS: ORAL_TABLET | ORAL | 0 refills | Status: DC

## 2022-06-22 NOTE — Telephone Encounter (Signed)
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 I educated the patient that the Nitroglycerin used during the test does contain some bovine products. I informed her that some patients in the past have not had any reaction to the nitroglycerin but the recommendation from pharmacy would be to give 13 hour prednisone prep. Prescription for 13 hour prep sent to patient's pharmacy on file.   Patient is unsure if she will take the prep since I told her she will need a ride. She is unsure if she can arrange a ride in time. She states she has never had an anaphylaxis reaction but does itch all over after consuming red meat.  Patient does deny an allergy to contrast.   I have reviewed how to take her 13 hour prep and metoprolol in addition to sending instructions to the patient's Mychart. Patient is aware to arrive at 2:30pm.

## 2022-06-23 ENCOUNTER — Telehealth: Payer: Self-pay | Admitting: Pharmacist

## 2022-06-23 ENCOUNTER — Ambulatory Visit (HOSPITAL_COMMUNITY)
Admission: RE | Admit: 2022-06-23 | Discharge: 2022-06-23 | Disposition: A | Discharge status: Home / Self Care | Payer: PPO | Source: Ambulatory Visit | Attending: Physician Assistant | Admitting: Physician Assistant

## 2022-06-23 ENCOUNTER — Other Ambulatory Visit: Payer: Self-pay | Admitting: Cardiovascular Disease

## 2022-06-23 DIAGNOSIS — R931 Abnormal findings on diagnostic imaging of heart and coronary circulation: Secondary | ICD-10-CM | POA: Insufficient documentation

## 2022-06-23 DIAGNOSIS — R072 Precordial pain: Secondary | ICD-10-CM | POA: Diagnosis not present

## 2022-06-23 MED ORDER — METOPROLOL TARTRATE 5 MG/5ML IV SOLN
INTRAVENOUS | Status: AC
  Administered 2022-06-23: 5 mg via INTRAVENOUS
  Filled 2022-06-23: qty 10

## 2022-06-23 MED ORDER — PRALUENT 75 MG/ML ~~LOC~~ SOAJ
1.0000 | SUBCUTANEOUS | 3 refills | Status: DC
Start: 2022-06-23 — End: 2023-07-26

## 2022-06-23 MED ORDER — IOHEXOL 350 MG/ML SOLN
95.0000 mL | Freq: Once | INTRAVENOUS | Status: AC | PRN
  Administered 2022-06-23: 95 mL via INTRAVENOUS

## 2022-06-23 MED ORDER — DILTIAZEM HCL 25 MG/5ML IV SOLN
INTRAVENOUS | Status: AC
  Filled 2022-06-23: qty 5

## 2022-06-23 MED ORDER — METOPROLOL TARTRATE 5 MG/5ML IV SOLN: 5.0000 mg | Freq: Once | INTRAVENOUS | Status: AC

## 2022-06-23 MED ORDER — DILTIAZEM HCL 25 MG/5ML IV SOLN
10.0000 mg | Freq: Once | INTRAVENOUS | Status: AC
  Administered 2022-06-23: 10 mg via INTRAVENOUS

## 2022-06-23 MED ORDER — NITROGLYCERIN 0.4 MG SL SUBL: 0.8000 mg | SUBLINGUAL_TABLET | Freq: Once | SUBLINGUAL | Status: AC

## 2022-06-23 MED ORDER — NITROGLYCERIN 0.4 MG SL SUBL
SUBLINGUAL_TABLET | SUBLINGUAL | Status: AC
  Administered 2022-06-23: 0.8 mg via SUBLINGUAL
  Filled 2022-06-23: qty 2

## 2022-06-23 NOTE — Telephone Encounter (Signed)
Nexletol copay would be $100/month on pt's insurance (Tier 4). [facility] office has some samples, would prefer pt to try med first to see if she tolerates it so that she doesn't spend a lot of money on a high copay for a med that may need to be stopped soon.  Left message for pt to discuss if she wishes to try Nexletol.

## 2022-06-23 NOTE — Telephone Encounter (Signed)
Pt returned call. Wonders if she should retry Praluent instead of Nexletol. Reports main concern was her alpha gal. She had also reported flu like sx with Praluent, when I asked about this she reports she has inflammation and arthritis at baseline. Since Praluent is more effective, she wishes to retry this first and will call with tolerability update in a few months.  If she has trouble tolerating, will plan to try Nexletol then. Otherwise, will schedule labs at that time.  Still has previous PA approval on file, wishes rx to be sent in as 3 mo supply for cost savings.

## 2022-06-23 NOTE — Progress Notes (Signed)
CT FFR ordered.   Nancy Bells T. Audie Box, MD, [facility]  [address], [place], [place] [address] [phone]  5:08 PM

## 2022-06-23 NOTE — Telephone Encounter (Signed)
Leeroy Bock, RPH-CPP 06/23/2022 10:59 AM EST Back to Top    I have seen pt before in lipid clinic. Challenging to manage as pt has multiple med intolerances including to atorvastatin '20mg'$  daily, rosuvastatin '10mg'$  daily and '5mg'$  2x per week, pravastatin 10-'20mg'$  daily, simvastatin '20mg'$  daily, ezetimibe '10mg'$  daily, Praluent, and Repatha. Her insurance doesn't cover Leqvio well. Only other med she could try is Nexletol which would drop her LDL about 20-25%. I'll give her a call to see if she's interested in trying this.   Stephani Police, RN 06/23/2022 10:02 AM EST     The patient has been notified of the result and verbalized understanding.  All questions (if any) were answered. Stephani Police, RN 06/23/2022 10:02 AM   Charlie Pitter, PA-C 06/22/2022  7:45 AM EST     Please let pt know labs are stable except cholesterol remains very high as we know - had referred back to lipid clinic at [place] yesterday. Magnesium level is normal so no need to add magnesium supplement (she had asked about starting one). Continue plan as discussed.

## 2022-06-23 NOTE — Progress Notes (Signed)
1456: Spoke with Pharmacy and pharmacy advised that there was no documentation to support premedication to decrease alpha-gal severity and nitro administration.    1458: Spoke with Dr. Audie Box and advised him of same and he stated that he would speak with Clarise Cruz and call me back.  1500: Dr. Audie Box returned call and advised that per Clarise Cruz, there were CT Heart studies previously done with premedication and patients were fine after receiving SL Nitro with Alpha-Gal allergies and to proceed as planned.   Confirmed with Dr. Audie Box that he wishes to proceed and administer SL Nitro.

## 2022-06-24 ENCOUNTER — Ambulatory Visit (HOSPITAL_BASED_OUTPATIENT_CLINIC_OR_DEPARTMENT_OTHER)
Admission: RE | Admit: 2022-06-24 | Discharge: 2022-06-24 | Disposition: A | Discharge status: Home / Self Care | Payer: PPO | Source: Ambulatory Visit | Attending: Cardiovascular Disease | Admitting: Cardiovascular Disease

## 2022-06-24 ENCOUNTER — Telehealth: Payer: Self-pay | Admitting: Physician Assistant

## 2022-06-24 DIAGNOSIS — R931 Abnormal findings on diagnostic imaging of heart and coronary circulation: Secondary | ICD-10-CM

## 2022-06-24 DIAGNOSIS — R072 Precordial pain: Secondary | ICD-10-CM | POA: Diagnosis not present

## 2022-06-24 MED ORDER — ASPIRIN 81 MG PO TBEC: 81.0000 mg | DELAYED_RELEASE_TABLET | Freq: Every day | ORAL | Status: AC

## 2022-06-24 NOTE — Telephone Encounter (Signed)
Cor CT abnormal as outlined in report with possible moderate-severe stenosis in mid distal LAD, with positive FFR. Study also showed small PFO. I discussed with Dr. Acie Fredrickson who suggests proceeding with cath. I called patient to discuss and she is agreeable. We also discussed adding ASA '81mg'$  which she is agreeable to. Will route msg to our triage team to help her get set up for left heart catheterization with possible PCI. Dx: abnormal coronary CTA, chest pain, left arm parasthesias. She prefers Dr. Burt Knack if possible. Let me know when cath scheduled and I can do orders.She had pre-med for coronary CTA but this was strictly for the NTG that was going to be used as it has bovine components and she has alpha-gal allergy. Is not allergic to contrast per her report. Keep plan for carotid and echo.  Please move up f/u visit to ~2 weeks post-cath, with me if possible.  Shared Decision Making/Informed Consent The risks [stroke (1 in 1000), death (1 in 1000), kidney failure [usually temporary] (1 in 500), bleeding (1 in 200), allergic reaction [possibly serious] (1 in 200)], benefits (diagnostic support and management of coronary artery disease) and alternatives of a cardiac catheterization were discussed in detail with Nancy [name] and she is willing to proceed.

## 2022-06-25 ENCOUNTER — Other Ambulatory Visit: Payer: Self-pay | Admitting: Physician Assistant

## 2022-06-25 ENCOUNTER — Encounter: Payer: Self-pay | Admitting: Physician Assistant

## 2022-06-25 ENCOUNTER — Ambulatory Visit (HOSPITAL_COMMUNITY)
Admission: RE | Admit: 2022-06-25 | Discharge: 2022-06-25 | Disposition: A | Discharge status: Home / Self Care | Payer: PPO | Source: Ambulatory Visit | Attending: Physician Assistant | Admitting: Physician Assistant

## 2022-06-25 DIAGNOSIS — R202 Paresthesia of skin: Secondary | ICD-10-CM | POA: Diagnosis not present

## 2022-06-25 MED ORDER — SODIUM CHLORIDE 0.9% FLUSH: 3.0000 mL | Freq: Two times a day (BID) | INTRAVENOUS | Status: DC

## 2022-06-25 MED ORDER — PREDNISONE 50 MG PO TABS: ORAL_TABLET | ORAL | 0 refills | Status: DC

## 2022-06-25 MED ORDER — DIPHENHYDRAMINE HCL 50 MG PO TABS: ORAL_TABLET | ORAL | 0 refills | Status: DC

## 2022-06-25 NOTE — Telephone Encounter (Signed)
Pt's f/u appt scheduled for 07/09/22 '@10'$ :05 and pt made aware.

## 2022-06-25 NOTE — Telephone Encounter (Addendum)
Done - can we also be sure to move up her f/u visit so that we can see her back in clinic before the end of February? Ideally 1-2 weeks post cath, Ok to schedule on my schedule in TOC slot. Thanks!

## 2022-06-25 NOTE — Telephone Encounter (Signed)
Called and spoke with patient. Scheduled cath for Wednesday 06/30/22 @ 1:00 w/[place]. Pt labs are updated already, done on 06/21/22. Discussed with patient the need for pre-med to treat her alpha gal allergy when using nitroglycerin. Medications sent to pharmacy at this time. Detailed instructions sent to patient via MyChart.

## 2022-06-25 NOTE — Telephone Encounter (Addendum)
Noted, appreciated! Triage, can you let pt know we WILL pre-medicate her for cath given the possible need for NTG during cath? Would follow our usual pre-medication prednisone + benadryl protocol. Thank you! Let her know I will also be adding NTG to her "allergy" list just so future people are aware that they cannot give this without pre-medication.

## 2022-06-25 NOTE — Progress Notes (Signed)
Pre cath orders 

## 2022-06-28 MED ORDER — CARVEDILOL 3.125 MG PO TABS: 3.1250 mg | ORAL_TABLET | Freq: Two times a day (BID) | ORAL | 2 refills | Status: DC

## 2022-06-28 NOTE — Addendum Note (Signed)
Addended by: Thompson Grayer on: 06/28/2022 04:23 PM   Modules accepted: Orders

## 2022-06-28 NOTE — Telephone Encounter (Signed)
See my chart message from patient in this note.  I spoke with patient and gave her message from [place], [place].  Prescription [facility] to [facility] [facility]

## 2022-06-28 NOTE — Telephone Encounter (Addendum)
Please thank pt for the follow-up - Start carvedilol 3.'125mg'$  BID for blood pressure. Check BP at least 3 hours after AM meds and bring log to upcoming appt

## 2022-06-29 ENCOUNTER — Telehealth: Payer: Self-pay | Admitting: *Deleted

## 2022-06-29 NOTE — Telephone Encounter (Addendum)
Cardiac Catheterization scheduled at [facility] for: Wednesday June 30, 2022 1 PM Arrival time and place: [place] Entrance A at: 11 AM  Nothing to eat after midnight prior to procedure, clear liquids until 5 AM day of procedure.  Medication instructions: -Usual morning medications can be taken with sips of water including aspirin 81 mg, Prednisone 50 mg and benadryl 50 mg *See notes below.  Confirmed patient has responsible adult to drive home post procedure and be with patient first 24 hours after arriving home.   Per Dr Clarita Crane pre-medicate for Alpha-Gal allergy-potential use of NTG during cath. Patient was pre-medicated with 13 hour Prednisone and Benadryl Prep prior to recent coronary CTA.  See 06/22/22 and 06/24/22 Phone Notes for details.  13 hour Prednisone and Benadryl Prep reviewed with patient: 06/30/22 Prednisone 50 mg 12 AM (midnight) 06/30/22 Prednisone 50 mg 6 AM 06/30/22 Prednisone 50 mg and Benadryl 50 mg -just prior to leaving home for hospital Patient advised not to drive.

## 2022-06-30 ENCOUNTER — Encounter (HOSPITAL_COMMUNITY)
Admission: RE | Disposition: A | Discharge status: Home / Self Care | Payer: Self-pay | Source: Home / Self Care | Attending: Cardiovascular Disease

## 2022-06-30 ENCOUNTER — Ambulatory Visit (HOSPITAL_COMMUNITY)
Admission: RE | Admit: 2022-06-30 | Discharge: 2022-07-01 | Disposition: A | Discharge status: Home / Self Care | Payer: PPO | Attending: Cardiovascular Disease | Admitting: Cardiovascular Disease

## 2022-06-30 ENCOUNTER — Other Ambulatory Visit: Payer: Self-pay

## 2022-06-30 DIAGNOSIS — Z955 Presence of coronary angioplasty implant and graft: Secondary | ICD-10-CM

## 2022-06-30 DIAGNOSIS — I10 Essential (primary) hypertension: Secondary | ICD-10-CM | POA: Diagnosis present

## 2022-06-30 DIAGNOSIS — I25119 Atherosclerotic heart disease of native coronary artery with unspecified angina pectoris: Secondary | ICD-10-CM | POA: Diagnosis not present

## 2022-06-30 DIAGNOSIS — R931 Abnormal findings on diagnostic imaging of heart and coronary circulation: Secondary | ICD-10-CM | POA: Insufficient documentation

## 2022-06-30 DIAGNOSIS — Z8249 Family history of ischemic heart disease and other diseases of the circulatory system: Secondary | ICD-10-CM | POA: Diagnosis not present

## 2022-06-30 DIAGNOSIS — I2510 Atherosclerotic heart disease of native coronary artery without angina pectoris: Secondary | ICD-10-CM | POA: Insufficient documentation

## 2022-06-30 DIAGNOSIS — F411 Generalized anxiety disorder: Secondary | ICD-10-CM | POA: Diagnosis present

## 2022-06-30 DIAGNOSIS — R202 Paresthesia of skin: Secondary | ICD-10-CM | POA: Insufficient documentation

## 2022-06-30 DIAGNOSIS — Z853 Personal history of malignant neoplasm of breast: Secondary | ICD-10-CM | POA: Insufficient documentation

## 2022-06-30 DIAGNOSIS — E785 Hyperlipidemia, unspecified: Secondary | ICD-10-CM | POA: Diagnosis present

## 2022-06-30 DIAGNOSIS — I209 Angina pectoris, unspecified: Secondary | ICD-10-CM | POA: Diagnosis present

## 2022-06-30 HISTORY — PX: LEFT HEART CATH AND CORONARY ANGIOGRAPHY: CATH118249

## 2022-06-30 HISTORY — PX: CORONARY STENT INTERVENTION: CATH118234

## 2022-06-30 LAB — POCT ACTIVATED CLOTTING TIME: Activated Clotting Time: 266 seconds

## 2022-06-30 SURGERY — LEFT HEART CATH AND CORONARY ANGIOGRAPHY
Anesthesia: LOCAL

## 2022-06-30 MED ORDER — CITALOPRAM HYDROBROMIDE 20 MG PO TABS
20.0000 mg | ORAL_TABLET | Freq: Every day | ORAL | Status: DC
  Administered 2022-06-30: 20 mg via ORAL
  Filled 2022-06-30: qty 1

## 2022-06-30 MED ORDER — HEPARIN SODIUM (PORCINE) 1000 UNIT/ML IJ SOLN
INTRAMUSCULAR | Status: DC | PRN
  Administered 2022-06-30 (×2): 3000 [IU] via INTRAVENOUS
  Administered 2022-06-30: 4000 [IU] via INTRAVENOUS

## 2022-06-30 MED ORDER — FENTANYL CITRATE (PF) 100 MCG/2ML IJ SOLN
INTRAMUSCULAR | Status: DC | PRN
  Administered 2022-06-30: 25 ug via INTRAVENOUS

## 2022-06-30 MED ORDER — VERAPAMIL HCL 2.5 MG/ML IV SOLN
INTRAVENOUS | Status: AC
  Filled 2022-06-30: qty 2

## 2022-06-30 MED ORDER — SODIUM CHLORIDE 0.9% FLUSH: 3.0000 mL | INTRAVENOUS | Status: DC | PRN

## 2022-06-30 MED ORDER — ASPIRIN 81 MG PO TBEC
81.0000 mg | DELAYED_RELEASE_TABLET | Freq: Every day | ORAL | Status: DC
  Administered 2022-07-01: 81 mg via ORAL
  Filled 2022-06-30: qty 1

## 2022-06-30 MED ORDER — HEPARIN SODIUM (PORCINE) 1000 UNIT/ML IJ SOLN
INTRAMUSCULAR | Status: AC
  Filled 2022-06-30: qty 10

## 2022-06-30 MED ORDER — LIDOCAINE HCL (PF) 1 % IJ SOLN
INTRAMUSCULAR | Status: DC | PRN
  Administered 2022-06-30: 5 mL via INTRADERMAL

## 2022-06-30 MED ORDER — MIDAZOLAM HCL 2 MG/2ML IJ SOLN
INTRAMUSCULAR | Status: AC
  Filled 2022-06-30: qty 2

## 2022-06-30 MED ORDER — ASPIRIN 81 MG PO CHEW: 81.0000 mg | CHEWABLE_TABLET | ORAL | Status: DC

## 2022-06-30 MED ORDER — FENTANYL CITRATE (PF) 100 MCG/2ML IJ SOLN
INTRAMUSCULAR | Status: AC
  Filled 2022-06-30: qty 2

## 2022-06-30 MED ORDER — SODIUM CHLORIDE 0.9 % WEIGHT BASED INFUSION: 1.0000 mL/kg/h | INTRAVENOUS | Status: DC

## 2022-06-30 MED ORDER — HEPARIN (PORCINE) IN NACL 1000-0.9 UT/500ML-% IV SOLN
INTRAVENOUS | Status: DC | PRN
  Administered 2022-06-30 (×2): 500 mL

## 2022-06-30 MED ORDER — ACETAMINOPHEN 325 MG PO TABS: 650.0000 mg | ORAL_TABLET | ORAL | Status: DC | PRN

## 2022-06-30 MED ORDER — SODIUM CHLORIDE 0.9 % IV SOLN: 250.0000 mL | INTRAVENOUS | Status: DC | PRN

## 2022-06-30 MED ORDER — FAMOTIDINE IN NACL 20-0.9 MG/50ML-% IV SOLN
INTRAVENOUS | Status: AC | PRN
  Administered 2022-06-30: 20 mg via INTRAVENOUS

## 2022-06-30 MED ORDER — LIDOCAINE HCL (PF) 1 % IJ SOLN
INTRAMUSCULAR | Status: AC
  Filled 2022-06-30: qty 30

## 2022-06-30 MED ORDER — VERAPAMIL HCL 2.5 MG/ML IV SOLN
INTRAVENOUS | Status: DC | PRN
  Administered 2022-06-30: 10 mL via INTRA_ARTERIAL

## 2022-06-30 MED ORDER — CLOPIDOGREL BISULFATE 300 MG PO TABS
ORAL_TABLET | ORAL | Status: DC | PRN
  Administered 2022-06-30: 600 mg via ORAL

## 2022-06-30 MED ORDER — ONDANSETRON HCL 4 MG/2ML IJ SOLN: 4.0000 mg | Freq: Four times a day (QID) | INTRAMUSCULAR | Status: DC | PRN

## 2022-06-30 MED ORDER — HEPARIN (PORCINE) IN NACL 1000-0.9 UT/500ML-% IV SOLN
INTRAVENOUS | Status: AC
  Filled 2022-06-30: qty 1000

## 2022-06-30 MED ORDER — SODIUM CHLORIDE 0.9 % WEIGHT BASED INFUSION
3.0000 mL/kg/h | INTRAVENOUS | Status: DC
  Administered 2022-06-30: 3 mL/kg/h via INTRAVENOUS

## 2022-06-30 MED ORDER — MIDAZOLAM HCL 2 MG/2ML IJ SOLN
INTRAMUSCULAR | Status: DC | PRN
  Administered 2022-06-30: 1 mg via INTRAVENOUS

## 2022-06-30 MED ORDER — CLOPIDOGREL BISULFATE 75 MG PO TABS
75.0000 mg | ORAL_TABLET | Freq: Every day | ORAL | Status: DC
  Administered 2022-07-01: 75 mg via ORAL
  Filled 2022-06-30: qty 1

## 2022-06-30 MED ORDER — NITROGLYCERIN 1 MG/10 ML FOR IR/CATH LAB
INTRA_ARTERIAL | Status: AC
  Filled 2022-06-30: qty 10

## 2022-06-30 MED ORDER — CLOPIDOGREL BISULFATE 300 MG PO TABS
ORAL_TABLET | ORAL | Status: AC
  Filled 2022-06-30: qty 2

## 2022-06-30 MED ORDER — SODIUM CHLORIDE 0.9 % WEIGHT BASED INFUSION: 1.0000 mL/kg/h | INTRAVENOUS | Status: AC

## 2022-06-30 MED ORDER — DIPHENHYDRAMINE HCL 25 MG PO CAPS: 25.0000 mg | ORAL_CAPSULE | Freq: Four times a day (QID) | ORAL | Status: DC | PRN

## 2022-06-30 MED ORDER — LABETALOL HCL 5 MG/ML IV SOLN: 10.0000 mg | INTRAVENOUS | Status: AC | PRN

## 2022-06-30 MED ORDER — CARVEDILOL 3.125 MG PO TABS
3.1250 mg | ORAL_TABLET | Freq: Two times a day (BID) | ORAL | Status: DC
  Administered 2022-06-30 – 2022-07-01 (×2): 3.125 mg via ORAL
  Filled 2022-06-30 (×2): qty 1

## 2022-06-30 MED ORDER — FAMOTIDINE IN NACL 20-0.9 MG/50ML-% IV SOLN
INTRAVENOUS | Status: AC
  Filled 2022-06-30: qty 50

## 2022-06-30 MED ORDER — HYDRALAZINE HCL 20 MG/ML IJ SOLN: 10.0000 mg | INTRAMUSCULAR | Status: AC | PRN

## 2022-06-30 MED ORDER — SODIUM CHLORIDE 0.9% FLUSH: 3.0000 mL | Freq: Two times a day (BID) | INTRAVENOUS | Status: DC

## 2022-06-30 SURGICAL SUPPLY — 19 items
BALL SAPPHIRE NC24 3.0X15 (BALLOONS) ×1
BALLN SAPPHIRE 2.5X15 (BALLOONS) ×1
BALLOON SAPPHIRE 2.5X15 (BALLOONS) IMPLANT
BALLOON SAPPHIRE NC24 3.0X15 (BALLOONS) IMPLANT
CATH 5FR JL3.5 JR4 ANG PIG MP (CATHETERS) IMPLANT
CATH LAUNCHER 5F EBU3.5 (CATHETERS) IMPLANT
DEVICE RAD COMP TR BAND LRG (VASCULAR PRODUCTS) IMPLANT
GLIDESHEATH SLEND SS 6F .021 (SHEATH) IMPLANT
GUIDEWIRE INQWIRE 1.5J.035X260 (WIRE) IMPLANT
INQWIRE 1.5J .035X260CM (WIRE) ×1
KIT HEART LEFT (KITS) ×1 IMPLANT
PACK CARDIAC CATHETERIZATION (CUSTOM PROCEDURE TRAY) ×1 IMPLANT
STENT SYNERGY XD 2.75X28 (Permanent Stent) IMPLANT
SYNERGY XD 2.75X28 (Permanent Stent) ×1 IMPLANT
SYR MEDRAD MARK 7 150ML (SYRINGE) ×1 IMPLANT
TRANSDUCER W/STOPCOCK (MISCELLANEOUS) ×1 IMPLANT
TUBING CIL FLEX 10 FLL-RA (TUBING) ×1 IMPLANT
VALVE GUARDIAN II ~~LOC~~ HEMO (MISCELLANEOUS) IMPLANT
WIRE COUGAR XT STRL 190CM (WIRE) IMPLANT

## 2022-06-30 NOTE — Progress Notes (Signed)
TR BAND REMOVAL  LOCATION:    Right radial  DEFLATED PER PROTOCOL:    Yes.    TIME BAND OFF / DRESSING APPLIED:    1720pm a clean dry dressing applied with gauze and tegaderm secured with coban   SITE UPON ARRIVAL:    Level 0  SITE AFTER BAND REMOVAL:    Level 1 Noted bruised  CIRCULATION SENSATION AND MOVEMENT:    Within Normal Limits   Yes.    COMMENTS:   Care instructions given to patient.

## 2022-06-30 NOTE — Interval H&P Note (Signed)
History and Physical Interval Note:  06/30/2022 2:51 PM  Nancy [name]  has presented today for surgery, with the diagnosis of abnormal coronary cta.  The various methods of treatment have been discussed with the patient and family. After consideration of risks, benefits and other options for treatment, the patient has consented to  Procedure(s): LEFT HEART CATH AND CORONARY ANGIOGRAPHY (N/A) as a surgical intervention.  The patient's history has been reviewed, patient examined, no change in status, stable for surgery.  I have reviewed the patient's chart and labs.  Questions were answered to the patient's satisfaction.     Sherren Mocha

## 2022-07-01 ENCOUNTER — Other Ambulatory Visit (HOSPITAL_COMMUNITY): Payer: Self-pay

## 2022-07-01 ENCOUNTER — Encounter (HOSPITAL_COMMUNITY): Payer: Self-pay | Admitting: Cardiovascular Disease

## 2022-07-01 DIAGNOSIS — F411 Generalized anxiety disorder: Secondary | ICD-10-CM | POA: Diagnosis not present

## 2022-07-01 DIAGNOSIS — I2510 Atherosclerotic heart disease of native coronary artery without angina pectoris: Secondary | ICD-10-CM | POA: Insufficient documentation

## 2022-07-01 DIAGNOSIS — Z853 Personal history of malignant neoplasm of breast: Secondary | ICD-10-CM | POA: Diagnosis not present

## 2022-07-01 DIAGNOSIS — E782 Mixed hyperlipidemia: Secondary | ICD-10-CM

## 2022-07-01 DIAGNOSIS — I209 Angina pectoris, unspecified: Secondary | ICD-10-CM

## 2022-07-01 DIAGNOSIS — Z8249 Family history of ischemic heart disease and other diseases of the circulatory system: Secondary | ICD-10-CM | POA: Diagnosis not present

## 2022-07-01 DIAGNOSIS — E785 Hyperlipidemia, unspecified: Secondary | ICD-10-CM | POA: Diagnosis not present

## 2022-07-01 DIAGNOSIS — I25119 Atherosclerotic heart disease of native coronary artery with unspecified angina pectoris: Secondary | ICD-10-CM | POA: Diagnosis not present

## 2022-07-01 DIAGNOSIS — I10 Essential (primary) hypertension: Secondary | ICD-10-CM | POA: Diagnosis not present

## 2022-07-01 DIAGNOSIS — R202 Paresthesia of skin: Secondary | ICD-10-CM | POA: Diagnosis not present

## 2022-07-01 DIAGNOSIS — R931 Abnormal findings on diagnostic imaging of heart and coronary circulation: Secondary | ICD-10-CM | POA: Diagnosis not present

## 2022-07-01 LAB — BASIC METABOLIC PANEL
Anion gap: 8 (ref 5–15)
BUN: 10 mg/dL (ref 8–23)
CO2: 24 mmol/L (ref 22–32)
Calcium: 9 mg/dL (ref 8.9–10.3)
Chloride: 104 mmol/L (ref 98–111)
Creatinine, Ser: 0.71 mg/dL (ref 0.44–1.00)
GFR, Estimated: >60 mL/min (ref >60)
Glucose, Bld: 102 mg/dL — ABNORMAL HIGH (ref 70–99)
Potassium: 3.9 mmol/L (ref 3.5–5.1)
Sodium: 136 mmol/L (ref 135–145)

## 2022-07-01 LAB — CBC
HCT: 37.7 % (ref 36.0–46.0)
Hemoglobin: 12.8 g/dL (ref 12.0–15.0)
MCH: 29.6 pg (ref 26.0–34.0)
MCHC: 34 g/dL (ref 30.0–36.0)
MCV: 87.1 fL (ref 80.0–100.0)
Platelets: 228 10*3/uL (ref 150–400)
RBC: 4.33 MIL/uL (ref 3.87–5.11)
RDW: 12.8 % (ref 11.5–15.5)
WBC: 9.5 10*3/uL (ref 4.0–10.5)
nRBC: 0 % (ref 0.0–0.2)

## 2022-07-01 MED ORDER — CALCIUM CARBONATE ANTACID 500 MG PO CHEW
1.0000 | CHEWABLE_TABLET | Freq: Every day | ORAL | Status: DC
  Administered 2022-07-01: 200 mg via ORAL
  Filled 2022-07-01: qty 1

## 2022-07-01 MED ORDER — NITROGLYCERIN 0.4 MG/SPRAY TL SOLN: 1.0000 | 2 refills | Status: AC | PRN

## 2022-07-01 MED ORDER — CLOPIDOGREL BISULFATE 75 MG PO TABS
75.0000 mg | ORAL_TABLET | Freq: Every day | ORAL | 2 refills | Status: DC
  Filled 2022-07-01: qty 90, 90d supply, fill #0

## 2022-07-01 NOTE — TOC Benefit Eligibility Note (Signed)
Patient Teacher, English as a foreign language completed.    The patient is currently admitted and upon discharge could be taking nitroglycerin 0.4 mg/spray.  The current 30 day co-pay is $100.00.   The patient is insured through [place], [place] Patient Advocate Specialist [place] Patient Advocate Team Direct Number: [phone]  Fax: [phone]

## 2022-07-01 NOTE — Discharge Summary (Addendum)
Discharge Summary    Patient ID: Nancy [name] MRN: [id number]; DOB: 1955/07/05  Admit date: 06/30/2022 Discharge date: 07/01/2022  PCP:  Pleas Koch, NP   [place] Prov[facility] Cardiologist:  Mertie Moores, MD   {  Discharge Diagnoses    Principal Problem:   Angina pectoris, unspecified ([place]) Active Problems:   HLD (hyperlipidemia)   GAD (generalized anxiety disorder)   Essential hypertension   CAD (coronary artery disease)    Diagnostic Studies/Procedures    Left Cardiac Catheterization 06/30/2022: Severe single vessel CAD with tandem 50% and 80% lesions in the mid-LAD, treated successfully with a 2.75x28 mm Synergy DES post-dilated to high pressure with a 3.0 mm [place] balloon Nonobstructive RCA stenosis Patent left main and LCx without significant stenosis   Recommend overnight observation because of late in day procedure time. ASA/clopidogrel x 6 months minimum. GDMT/risk reduction measures for secondary prevention.  Diagnostic Dominance: Right  Intervention      _____________   History of Present Illness     Nancy [name] is a 67 y.o. female with a history of CAD noted on recent coronary CTA, hypertension, hyperlipidemia intolerant to statins and Zetia as well as PCSK9 inhibitors, anxiety, alpha gal allergy, breast cancer, and lichen sclerosis. Coronary calcium score in 08/2019 came back at 243 (91st percentile) with scattered calcifications in LAD, LCX, and RCA as well as aortic atherosclerosis. She was recently seen by Melina Copa, PA-C, in the office on 06/22/2022 at which time she reported 2 episodes of tingling all the way down her left arm around Christmas time that resolved without intervention. Each episode lasted around 10 minutes. She denied any other neurologic symptoms concerning for stroke. In addition she reported a long-stranding history of atypical chest pain in a focal area over her chest wall that first began after a horse accident  several years ago. This is aggravated after she does upper body activities. However, she reported that she is generally able to exert herself without exertional angina or dyspnea. Carotid dopplers, Echo, and coronary CTA were ordered for further evaluation. Echo is scheduled for later this months. Carotid dopplers were unremarkable with only 1-39% stenosis of left ICA. Coronary CTA showed a  coronary calcium score of 260 (89th percentile for age and sex) with moderate to severe stenosis in the LAD with positive FFR in the distal LAD. CTA also showed a small PFO. Cardiac catheterization was recommended for further evaluation.  Hospital Course     Consultants: None   Patient [facility]2022 for planned outpatient cardiac catheterization. LHC showed 50% stenosis followed by 80% stenosis of mid LAD and 40% stenosis of mid RCA. Patient underwent successful PCI with DES to the LAD lesion. She tolerated the procedure well but was admitted overnight for observation given procedure was performed late in the day. She is doing well this morning. She does report some very vague chest tightness but she thinks this is related to indigestion and/or anxiety. EKG this morning shows no acute changes. This does not sound like angina. Will give TUMS which she states she usually helps at home.  She was able to ambulate with Cardiac Rehab without any issues. Plan is for DAPT with Aspirin '81mg'$  daily and Plavix '75mg'$  daily for a minimum of 6 months. Intolerant to multiple statins and Zetia in the past. She was recently restarted on Praluent. Continue all other home medications.  Will also provide a prescription of PRN Nitroglycerin. Of note, patient  has alpha gal allergy and states she was previously told that Nitroglycerin contains and animal product. [place] team tried to reach out to [place] but we were unable to get a clear answer. Sublingual nitroglycerin does contain a pre-gelatin starch and  patient is supposed to avoid gelatin. Discussed with patient and she is aware - she states she will just take Benadryl beforehand if she ever needs the Nitroglycerin. Discussed with Dr. Irish Lack who recommended prescribing Nitroglycerin spray rather than sublingual tablet.  Patient seen and examined by Dr. Irish Lack and determined to be stable for discharge. Outpatient follow-up arranged. Medications as below.   Did the patient have an acute coronary syndrome (MI, NSTEMI, STEMI, etc) this admission?:  No                               Did the patient have a percutaneous coronary intervention (stent / angioplasty)?:  Yes.     Cath/PCI Registry Performance & Quality Measures: Aspirin prescribed? - Yes ADP Receptor Inhibitor (Plavix/Clopidogrel, Brilinta/Ticagrelor or Effient/Prasugrel) prescribed (includes medically managed patients)? - Yes High Intensity Statin (Lipitor 40-'80mg'$  or Crestor 20-'40mg'$ ) prescribed? - No - intolerant to statins. On Praluent at home. For EF <40%, was ACEI/ARB prescribed? - Not Applicable (EF >/= 42%) For EF <40%, Aldosterone Antagonist (Spironolactone or Eplerenone) prescribed? - Not Applicable (EF >/= 35%) Cardiac Rehab Phase II ordered? - Yes   _____________  Discharge Vitals Blood pressure 128/61, pulse 70, temperature 97.8 F (36.6 C), temperature source Oral, resp. rate 18, height '5\' 3"'$  (1.6 m), weight 65.7 kg, SpO2 100 %.  Filed Weights   06/30/22 1112 06/30/22 2150  Weight: 66.7 kg 65.7 kg   General: 67 y.o. Caucasian female resting comfortably in no acute distress. HEENT: Normocephalic and atraumatic. Sclera clear. Neck: Supple.  No JVD. Heart: RRR. Distinct S1 and S2. No murmurs, gallops, or rubs. Radial radial cath site with small hematoma proximal to cath site but resolving. Good radial pulse. Lungs: No increased work of breathing. Clear to ausculation bilaterally. No wheezes, rhonchi, or rales.  Abdomen: Soft, non-distended, and non-tender to  palpation.  MSK: Normal strength and tone for age. Extremities: No lower extremity edema.    Skin: Warm and dry. Neuro: Alert and oriented x3. No focal deficits. Psych: Normal affect. Responds appropriately.   Labs & Radiologic Studies    CBC Recent Labs    07/01/22 0224  WBC 9.5  HGB 12.8  HCT 37.7  MCV 87.1  PLT 361   Basic Metabolic Panel Recent Labs    07/01/22 0224  NA 136  K 3.9  CL 104  CO2 24  GLUCOSE 102*  BUN 10  CREATININE 0.71  CALCIUM 9.0   Liver Function Tests No results for input(s): "AST", "ALT", "ALKPHOS", "BILITOT", "PROT", "ALBUMIN" in the last 72 hours. No results for input(s): "LIPASE", "AMYLASE" in the last 72 hours. High Sensitivity Troponin:   No results for input(s): "TROPONINIHS" in the last 720 hours.  BNP Invalid input(s): "POCBNP" D-Dimer No results for input(s): "DDIMER" in the last 72 hours. Hemoglobin A1C No results for input(s): "HGBA1C" in the last 72 hours. Fasting Lipid Panel No results for input(s): "CHOL", "HDL", "LDLCALC", "TRIG", "CHOLHDL", "LDLDIRECT" in the last 72 hours. Thyroid Function Tests No results for input(s): "TSH", "T4TOTAL", "T3FREE", "THYROIDAB" in the last 72 hours.  Invalid input(s): "FREET3" _____________  CARDIAC CATHETERIZATION  Result Date: 06/30/2022 Severe single vessel CAD with tandem 50% and 80%  lesions in the mid-LAD, treated successfully with a 2.75x28 mm Synergy DES post-dilated to high pressure with a 3.0 mm [place] balloon Nonobstructive RCA stenosis Patent left main and LCx without significant stenosis Recommend overnight observation because of late in day procedure time. ASA/clopidogrel x 6 months minimum. GDMT/risk reduction measures for secondary prevention.   VAS US CAROTID  Result Date: 06/26/2022 Carotid Arterial Duplex Study Patient Name:  Nancy [name]  Date of Exam:   06/25/2022 Medical Rec #: [id number]       Accession #:    [id number] Date of Birth: 07/07/1955       Patient Gender: F  Patient Age:   61 years Exam Location:  [facility] Procedure:      VAS US CAROTID Referring Phys: Melina Copa --------------------------------------------------------------------------------  Indications:       Paresthesia of left arm. Around Christmastime she reports she                    had 2 separate episodes where her left arm began tingling all                    the way down without provocation. It resolved without                    intervention. She had no associated neurologic symptoms                    otherwise like facial asymmetry, speech difficulty or focal                    weakness. Each episode lasted about 10 minutes. It was not                    associated with any specific position. She does report a                    prior history of spinal issues and wonders if this was                    contributing. Risk Factors:      Hypertension, hyperlipidemia, no history of smoking, coronary                    artery disease. Comparison Study:  NA Performing Technologist: Leavy Cella RDCS  Examination Guidelines: A complete evaluation includes B-mode imaging, spectral Doppler, color Doppler, and power Doppler as needed of all accessible portions of each vessel. Bilateral testing is considered an integral part of a complete examination. Limited examinations for reoccurring indications may be performed as noted.  Right Carotid Findings: +----------+--------+--------+--------+------------------+--------+           PSV cm/sEDV cm/sStenosisPlaque DescriptionComments +----------+--------+--------+--------+------------------+--------+ CCA Prox  94      16                                         +----------+--------+--------+--------+------------------+--------+ CCA Mid   105     24              heterogenous               +----------+--------+--------+--------+------------------+--------+ CCA Distal77      22                                          +----------+--------+--------+--------+------------------+--------+  ICA Prox  69      23                                         +----------+--------+--------+--------+------------------+--------+ ICA Mid   81      26                                         +----------+--------+--------+--------+------------------+--------+ ICA Distal63      20                                         +----------+--------+--------+--------+------------------+--------+ ECA       73      19                                         +----------+--------+--------+--------+------------------+--------+ +----------+--------+-------+----------------+-------------------+           PSV cm/sEDV cmsDescribe        Arm Pressure (mmHG) +----------+--------+-------+----------------+-------------------+ [id number]      2      Multiphasic, [id number]                 +----------+--------+-------+----------------+-------------------+ +---------+--------+--+--------+--+---------+ VertebralPSV cm/s41EDV cm/s13Antegrade +---------+--------+--+--------+--+---------+  Left Carotid Findings: +----------+--------+--------+--------+------------------+--------+           PSV cm/sEDV cm/sStenosisPlaque DescriptionComments +----------+--------+--------+--------+------------------+--------+ CCA Prox  104     26                                         +----------+--------+--------+--------+------------------+--------+ CCA Mid   101     22                                         +----------+--------+--------+--------+------------------+--------+ CCA Distal82      27                                         +----------+--------+--------+--------+------------------+--------+ ICA Prox  78      25      1-39%   heterogenous               +----------+--------+--------+--------+------------------+--------+ ICA Mid   82      33                                          +----------+--------+--------+--------+------------------+--------+ ICA Distal71      30                                         +----------+--------+--------+--------+------------------+--------+ ECA       90      21                                         +----------+--------+--------+--------+------------------+--------+ +----------+--------+--------+----------------+-------------------+  PSV cm/sEDV cm/sDescribe        Arm Pressure (mmHG) +----------+--------+--------+----------------+-------------------+ [id number]     14      Multiphasic, [id number]                 +----------+--------+--------+----------------+-------------------+ +---------+--------+--+--------+--+---------+ VertebralPSV cm/s52EDV cm/s17Antegrade +---------+--------+--+--------+--+---------+   Summary: Right Carotid: The extracranial vessels were near-normal with only minimal wall                thickening or plaque. Left Carotid: Velocities in the left ICA are consistent with a 1-39% stenosis. Vertebrals:  Bilateral vertebral arteries demonstrate antegrade flow. Subclavians: Normal flow hemodynamics were seen in bilateral subclavian              arteries. *See table(s) above for measurements and observations.  Electronically signed by Larae Grooms MD on 06/26/2022 at 8:52:01 AM.    Final    CT CORONARY MORPH W/CTA COR W/SCORE W/CA W/CM &/OR WO/CM  Addendum Date: 06/24/2022   ADDENDUM REPORT: 06/24/2022 11:55 ADDENDUM: OVER-READ INTERPRETATION  CT CHEST The following report is an over-read performed by radiologist Dr. Minerva Fester [facility] on 06/24/2022. This over-read does not include interpretation of cardiac or coronary anatomy or pathology. The coronary calcium and coronary CT angiography interpretation by the cardiologist is attached. Imaging of the chest is focused on cardiac structures and excludes much of the chest on CT. COMPARISON: None available FINDINGS:  Cardiovascular: See dedicated report for cardiovascular details. Mediastinum/Nodes: No adenopathy or acute process in the mediastinum. Lungs/Pleura: Mild basilar atelectasis. No pleural or pericardial effusion to the extent evaluated. Airways are unremarkable to the extent imaged on today's study. Upper Abdomen: Incidental imaging of upper abdominal contents without acute process. Musculoskeletal: No acute or destructive bone finding. IMPRESSION: No acute or significant extracardiac findings. Electronically Signed   By: Zetta Bills M.D.   On: 06/24/2022 11:55   Result Date: 06/24/2022 CLINICAL DATA:  Chest pain EXAM: Cardiac/Coronary CTA TECHNIQUE: A non-contrast, gated CT scan was obtained with axial slices of 3 mm through the heart for calcium scoring. Calcium scoring was performed using the Agatston method. A 120 kV prospective, gated, contrast cardiac scan was obtained. Gantry rotation speed was 250 msecs and collimation was 0.6 mm. Two sublingual nitroglycerin tablets (0.8 mg) were given. The 3D data set was reconstructed in 5% intervals of the 35-75% of the R-R cycle. Diastolic phases were analyzed on a dedicated workstation using MPR, MIP, and VRT modes. The patient received 95 cc of contrast. FINDINGS: Image quality: Good.  Slab artifact noted. Noise artifact is: Limited. Coronary Arteries:  Normal coronary origin.  Right dominance. Left main: The left main is a large caliber vessel with a normal take off from the left coronary cusp that bifurcates to form a left anterior descending artery and a left circumflex artery. There is no plaque or stenosis. Left anterior descending artery: The proximal LAD contains minimal calcified plaque (<25%). The mid to distal LAD contains a moderate to severe non-calcified stenosis (50-70%). The LAD gives off 2 patent diagonal branches. Left circumflex artery: The LCX is non-dominant. The LCX contains minimal calcified plaque (<25%). The LCX gives off 2 patent obtuse  marginal branches. Right coronary artery: The RCA is dominant with normal take off from the right coronary cusp. The proximal and mid RCA segments contains mild calcified plaque (25-49%). The distal RCA is non-diagnostic due to slab artifact. Right Atrium: Right atrial size is within normal limits. Right Ventricle: The right ventricular cavity is within normal limits.  Left Atrium: Left atrial size is normal in size with no left atrial appendage filling defect. Small PFO. Left Ventricle: The ventricular cavity size is within normal limits. Pulmonary arteries: Normal in size without proximal filling defect. Pulmonary veins: Normal pulmonary venous drainage. Pericardium: Normal thickness without significant effusion or calcium present. Cardiac valves: The aortic valve is trileaflet without significant calcification. The mitral valve is normal without significant calcification. Aorta: Normal caliber without significant disease. Extra-cardiac findings: See attached radiology report for non-cardiac structures. IMPRESSION: 1. Coronary calcium score of 260. This was 89th percentile for age-, sex, and race-matched controls. 2. Normal coronary origin with right dominance. 3. Mid to distal LAD contains a moderate to severe non-calcified plaque (50-70%). 4. Minimal calcified plaque (<25%) in the LCX. 5. Mild calcified plaque in the proximal and mid RCA (25-49%). 6. Distal RCA non-diagnostic due to slab artifact. 7. Small PFO. RECOMMENDATIONS: 1. Moderate to severe stenosis in the LAD. Consider symptom-guided anti-ischemic pharmacotherapy as well as risk factor modification per guideline directed care. Additional analysis with CT FFR will be submitted. Eleonore Chiquito, MD Electronically Signed: By: Eleonore Chiquito M.D. On: 06/23/2022 17:07   CT CORONARY FFR DATA PREP & FLUID ANALYSIS  Result Date: 06/23/2022 EXAM: CT FFR analysis was performed on the original cardiac CTA dataset. Diagrammatic representation of the CT FFR  analysis is provided in a separate PDF document in PACS. This dictation was created using the PDF document and an interactive 3D model of the results. The 3D model is not available in the EMR/PACS. INTERPRETATION: CT FFR provides simultaneous calculation of pressure and flow across the entire coronary tree. For clinical decision making, CT FFR values should be obtained 1-2 cm distal to the lower border of each stenosis measured. Coronary CTA-related artifacts may impair the diagnostic accuracy of the original cardiac CTA and FFR CT results. *Due to the fact that CT FFR represents a mathematically-derived analysis, it is recommended that the results be interpreted as follows: 1. CT FFR >0.80: Low likelihood of hemodynamic significance. 2. CT FFR 0.76-0.80: Borderline likelihood of hemodynamic significance. 3. CT FFR =< 0.75: High likelihood of hemodynamic significance. *Coronary CT Angiography-derived Fractional Flow Reserve Testing in Patients with Stable Coronary Artery Disease: Recommendations on Interpretation and Reporting. Radiology: Cardiothoracic Imaging. 2019;1(5):e190050 FINDINGS: 1. Left Main: 1.0; low likelihood of hemodynamic significance. 2. Prox LAD: 0.97; low likelihood of hemodynamic significance. 3. Mid LAD: 0.95; low likelihood of hemodynamic significance. 4. Distal LAD: 0.69; high likelihood of hemodynamic significance. 5. LCX: 0.96; low likelihood of hemodynamic significance. 6. Prox RCA: 0.88; low likelihood of hemodynamic significance. 7. Mid RCA: 0.85; low likelihood of hemodynamic significance. 8. Distal RCA: Not analyzed. IMPRESSION: 1.  Distal LAD is positive by CT FFR (0.69). Eleonore Chiquito, MD Electronically Signed   By: Eleonore Chiquito M.D.   On: 06/23/2022 17:13   Disposition   Patient is being discharged home today in good condition.  Follow-up Plans & Appointments     Follow-up Information     Charlie Pitter, PA-C Follow up.   Specialties: Cardiology, Radiology Why: Please  keep follow-up visit scheduled for 07/09/2022 at 10:05am. Contact information: [address] [place] [place] [address] [phone]                Discharge Instructions     Amb Referral to Cardiac Rehabilitation   Complete by: As directed    Diagnosis: Coronary Stents   After initial evaluation and assessments completed: Virtual Based Care may be provided  alone or in conjunction with Phase 2 Cardiac Rehab based on patient barriers.: Yes   Intensive Cardiac Rehabilitation (ICR) [place] location only OR Traditional Cardiac Rehabilitation (TCR) *If criteria for ICR are not met will enroll in TCR [facility] only): Yes   Diet - low sodium heart healthy   Complete by: As directed    Increase activity slowly   Complete by: As directed         Discharge Medications   Allergies as of 07/01/2022       Reactions   Alpha-gal Anaphylaxis, Hives, Itching, Rash   Macrobid [nitrofurantoin] Other (See Comments)   Hypoglycemia   Atorvastatin    Myalgias on '20mg'$  daily dosing   Lisinopril    Not allergy, intolerance: headaches   Milk Protein    Nitroglycerin    Not allergic, but needs premedication to use this as it contains bovine components (alpha gal allergy)   Other    Red Meats, Milk, Shellfish   Praluent [alirocumab]    Flu-like symptoms   Pravastatin    10-'20mg'$  - myalgias   Repatha [evolocumab]    Back aches, headache, sore throat   Rosuvastatin    Myalgias on '10mg'$  daily and '5mg'$  2x/week dosing   Shellfish Allergy    Simvastatin    Myalgias on '20mg'$  daily dosing   Statins Other (See Comments)   Myalgias    Zetia [ezetimibe] Other (See Comments)   Myalgias        Medication List     STOP taking these medications    predniSONE 50 MG tablet Commonly known as: DELTASONE       TAKE these medications    aspirin EC 81 MG tablet Take 1 tablet (81 mg total) by mouth daily.   B-12 1000 MCG Caps Take 1 tablet by mouth 3 (three) times a week.    carvedilol 3.125 MG tablet Commonly known as: Coreg Take 1 tablet (3.125 mg total) by mouth 2 (two) times daily with a meal.   citalopram 20 MG tablet Commonly known as: CELEXA Take 1 tablet (20 mg total) by mouth daily. For anxiety. What changed: when to take this   clobetasol cream 0.05 % Commonly known as: TEMOVATE Apply 1 application  topically daily.   clopidogrel 75 MG tablet Commonly known as: PLAVIX Take 1 tablet (75 mg total) by mouth daily with breakfast.   diphenhydrAMINE 25 MG tablet Commonly known as: BENADRYL Take 1 tablet (25 mg total) by mouth every 6 (six) hours as needed (rash, itching). What changed: Another medication with the same name was removed. Continue taking this medication, and follow the directions you see here.   EPINEPHrine 0.3 mg/0.3 mL Soaj injection Commonly known as: EPI-PEN Inject 0.3 mg into the muscle as needed for anaphylaxis.   nitroGLYCERIN 0.4 MG/SPRAY spray Commonly known as: Nitrolingual Place 1 spray under the tongue every 5 (five) minutes as needed for chest pain.   Praluent 75 MG/ML Soaj Generic drug: Alirocumab Inject 1 Pen into the skin every 14 (fourteen) days.   VITAMIN D-3 PO Take 1 tablet by mouth 3 (three) times a week.           Outstanding Labs/Studies   N/A  Duration of Discharge Encounter   Greater than 30 minutes including physician time.  Signed, Darreld Mclean, PA-C 07/01/2022, 10:34 AM  I have examined the patient and reviewed assessment and plan and discussed with patient.  Agree with above as stated.    GEN: Well nourished, well  developed, in no acute distress  HEENT: normal  Neck: no JVD, carotid bruits, or masses Cardiac: RRR; no murmurs, rubs, or gallops,no edema  Respiratory:  clear to auscultation bilaterally, normal work of breathing GI: soft, nontender, nondistended,  MS: no deformity or atrophy; bruising at right wrist but soft, palpable radial pulse  Skin: warm and dry, no  rash Neuro:  Strength and sensation are intact Psych: euthymic mood, full affect  Continue dual antiplatelet therapy along with aggressive secondary prevention.  Okay for discharge later today.  She is awaiting her 30-day supply of clopidogrel from the pharmacy.  Consider NTG spray due to possible animal products in the SL NTG tablets.  Also discussed artidficial sweeteners.  Recommended trying to stick to a while food, plant based diet.   TIME SPENT WITH PATIENT: 40 minutes of direct patient care. More than 50% of that time was spent by me on coordination of care and counseling regarding the above meds and issues with healthy lifestyle.    Larae Grooms

## 2022-07-01 NOTE — Progress Notes (Signed)
CARDIAC REHAB PHASE I   PRE:  Rate/Rhythm: 70 NSR  BP:  Sitting: 114/61      SaO2: 100 RA  MODE:  Ambulation: 470 ft   AD:  None  POST:  Rate/Rhythm: 98 NSR  BP:  Sitting: 153/66      SaO2: 100 RA  Pt amb with standby assistance, pt denies CP and SOB during amb and was returned to room w/o complaint.  Post-ambulation RPE 10 (fairly light) (scale 6-20) Pt walked well.  Pt was educated on stent card, stent location, Plavix and ASA use, wt restrictions, no baths/daily wash-ups, s/s of infection, ex guidelines (progressive walking and resistance exercise), s/s to stop exercising, NTG use and calling 911, heart healthy diet, risk factors (HLD and HTN) and CRPII. Pt received materials on exercise, diet, and CRPII. Will refer to [facility].   Pt is very active, walks 30 minutes/day and does resistance exercises with 2lb weights. Encouraged pt to progressively increase exercise to 60 min/day and start isometric muscle exercises. Pt would do well in program.    Christen Bame  9:12 AM 07/01/2022    Service time is from 0815 to 0912.

## 2022-07-01 NOTE — Progress Notes (Signed)
Discharge instructions reviewed with pt and son.  Copy of instructions given to pt. TOC pharmacy has filled script for plavix and will be picked up and given to pt. Reviewed wrist restrictions with pt, handout provided. Questions answered.  Pt to be d/c'd via wheelchair with belongings, with son.        To be escorted by hospital volunteer or staff.

## 2022-07-01 NOTE — Discharge Instructions (Signed)
Post Cardiac Catheterization: NO HEAVY LIFTING OR SEXUAL ACTIVITY X 7 DAYS. NO DRIVING X 2-3 DAYS. NO SOAKING BATHS, HOT TUBS, POOLS, ETC., X 7 DAYS.  Radial Site Care: Refer to this sheet in the next few weeks. These instructions provide you with information on caring for yourself after your procedure. Your caregiver may also give you more specific instructions. Your treatment has been planned according to current medical practices, but problems sometimes occur. Call your caregiver if you have any problems or questions after your procedure. HOME CARE INSTRUCTIONS  You may shower the day after the procedure.Remove the bandage (dressing) and gently wash the site with plain soap and water.Gently pat the site dry.   Do not apply powder or lotion to the site.   Do not submerge the affected site in water for 3 to 5 days.   Inspect the site at least twice daily.   Do not flex or bend the affected arm for 24 hours.   No lifting over 5 pounds (2.3 kg) for 5 days after your procedure.   Do not drive home if you are discharged the same day of the procedure. Have someone else drive you.  What to expect:  Any bruising will usually fade within 1 to 2 weeks.   Blood that collects in the tissue (hematoma) may be painful to the touch. It should usually decrease in size and tenderness within 1 to 2 weeks.  SEEK IMMEDIATE MEDICAL CARE IF:  You have unusual pain at the radial site.   You have redness, warmth, swelling, or pain at the radial site.   You have drainage (other than a small amount of blood on the dressing).   You have chills.   You have a fever or persistent symptoms for more than 72 hours.   You have a fever and your symptoms suddenly get worse.   Your arm becomes pale, cool, tingly, or numb.   You have heavy bleeding from the site. Hold pressure on the site.   

## 2022-07-02 LAB — LIPOPROTEIN A (LPA): Lipoprotein (a): 17 nmol/L (ref <75.0)

## 2022-07-06 ENCOUNTER — Telehealth: Payer: Self-pay

## 2022-07-06 ENCOUNTER — Encounter: Payer: Self-pay | Admitting: Physician Assistant

## 2022-07-06 NOTE — Progress Notes (Addendum)
Cardiology Office Note    Date:  07/09/2022   ID:  Nancy [name], DOB [birth year], MRN [id number]  PCP:  Pleas Koch, NP  Cardiologist:  Mertie Moores, MD  Electrophysiologist:  None   Chief Complaint: post PCI f/u  History of Present Illness:   Nancy [name] is a 67 y.o. female with history of HTN, aortic atherosclerosis, anxiety, alpha gal allergy, breast CA, lichen sclerosis, mild carotid artery disease (123456 LICA [id number]), recently diagnosed obstructive CAD seen for follow-up.  She previously established with Dr. Acie Fredrickson for after an episode of face tingling in the setting of severely high blood pressure. Her medications were adjusted. Calcium score 08/2019 CAD 243 (91%ile) with scattered calcifications in LAD, LCX, RCA, + aortic atherosclerosis otherwise overread OK. She subsequently followed with the pharmD clinic for HLD - had prior intolerances to atorvastatin 478m daily, rosuvastatin 164mdaily and 78m67mx per week, simvastatin 29m60mily, ezetimibe 10mg13mly. She previously had back aches, sore throat, headaches with Repatha and was changed to Praluent but then stopped this as she reported anxiety and tremors and was also concerned about her alpha gal since Praluent is derived from [employer] ovary cells. She was subsequently restarted on Praluent - LDL went from 183 to 124 in follow-up, however, she again stopped Praluent as it caused recurrent flu-like symptoms. She was recently seen in the office 06/21/22 for somewhat atypical symptoms with 2 episodes of new left arm paraesthesias without additional associated neurologic symptoms. She also reported longstanding atypical CP following a horse accident. She also was agreeable to re-trial of PCSK9i helped by pharm team. Carvedilol also added for HTN. Cor CT was arranged which was abnormal prompting cath 06/30/22 which showed 50% and 80% tandem lesions in mid LAD, tx with DES, with nonobstructive RCA stenosis, patent LM, LCx. It was  recommended to continue ASA/clopidogrel x 6 months minimum. She did have a right radial hematoma with bruising noted prior to discharge that did not require further intervention.  2D echo is pending. The hospital team did clarify with pharmD they were able to rx SL NTG spray at dc (there was some concern about oral NTG interfering with alpha gal).  She is seen back for follow-up doing well. She denies any recurrent CP or L arm parasthesias. No SOB, bleeding, palpitations or syncope. [place] She has been following her BP and it was high prior to cath but has since been trending toward normal values.  Labwork independently reviewed: 06/2022 K 3.9, Cr 0.71, Hgb 12.8, plt 228, LDL 211, trig 130, Mg 2.3, LFTs ok, TSH wnl   Past History   Past Medical History:  Diagnosis Date   Allergy to alpha-gal    Breast cancer (HCC) [place]12/2022   CAD (coronary artery disease)    Chronic neck pain 04/11/2018   Coronary artery calcification seen on CAT scan    Eczema    Essential hypertension 07/20/2019   Fatigue    GAD (generalized anxiety disorder) 06/26/2018   Hyperlipidemia    Lichen sclerosus    Overweight (BMI 25.0-29.9) 01/09/2018   Recurrent upper respiratory infection (URI)    Routine general medical examination at a health care facility    Urinary frequency 06/06/2019   Urticaria    Vaginal itching 06/06/2019    Past Surgical History:  Procedure Laterality Date   ANTERIOR CERVICAL DECOMP/DISCECTOMY FUSION     BREAST LUMPECTOMY WITH RADIOACTIVE SEED LOCALIZATION Left 11/14/2020  Procedure: LEFT BREAST LUMPECTOMY WITH RADIOACTIVE SEED LOCALIZATION;  Surgeon: Erroll Luna, MD;  Location: [facility];  Service: General;  Laterality: Left;   [place] and 1983   COLONOSCOPY WITH PROPOFOL N/A 10/06/2020   Procedure: COLONOSCOPY WITH PROPOFOL;  Surgeon: Virgel Manifold, MD;  Location: [facility];  Service:  Endoscopy;  Laterality: N/A;   CORONARY STENT INTERVENTION N/A 06/30/2022   Procedure: CORONARY STENT INTERVENTION;  Surgeon: Sherren Mocha, MD;  Location: [facility];  Service: Cardiovascular;  Laterality: N/A;   LEFT HEART CATH AND CORONARY ANGIOGRAPHY N/A 06/30/2022   Procedure: LEFT HEART CATH AND CORONARY ANGIOGRAPHY;  Surgeon: Sherren Mocha, MD;  Location: [facility];  Service: Cardiovascular;  Laterality: N/A;   PARTIAL HYSTERECTOMY     TUBAL LIGATION  1985    Current Medications: Current Meds  Medication Sig   Alirocumab (PRALUENT) 75 MG/ML SOAJ Inject 1 Pen into the skin every 14 (fourteen) days.   aspirin EC 81 MG tablet Take 1 tablet (81 mg total) by mouth daily.   carvedilol (COREG) 3.125 MG tablet Take 1 tablet (3.125 mg total) by mouth 2 (two) times daily with a meal.   Cholecalciferol (VITAMIN D-3 PO) Take 1 tablet by mouth 3 (three) times a week.   citalopram (CELEXA) 20 MG tablet Take 1 tablet (20 mg total) by mouth daily. For anxiety. (Patient taking differently: Take 20 mg by mouth at bedtime. For anxiety.)   clobetasol cream (TEMOVATE) [id number] % Apply 1 application  topically daily.   clopidogrel (PLAVIX) 75 MG tablet Take 1 tablet (75 mg total) by mouth daily with breakfast.   Cyanocobalamin (B-12) 1000 MCG CAPS Take 1 tablet by mouth 3 (three) times a week.   diphenhydrAMINE (BENADRYL) 25 MG tablet Take 1 tablet (25 mg total) by mouth every 6 (six) hours as needed (rash, itching).   EPINEPHrine 0.3 mg/0.3 mL IJ SOAJ injection Inject 0.3 mg into the muscle as needed for anaphylaxis.   nitroGLYCERIN (NITROLINGUAL) 0.4 MG/SPRAY spray Place 1 spray under the tongue every 5 (five) minutes as needed for chest pain.      Allergies:   Alpha-gal, Macrobid [nitrofurantoin], Atorvastatin, Lisinopril, Milk protein, Nitroglycerin, Other, Praluent [alirocumab], Pravastatin, Repatha [evolocumab], Rosuvastatin, Shellfish allergy, Simvastatin, Statins, and Zetia  [ezetimibe]   Social History   Socioeconomic History   Marital status: Single    Spouse name: Not on file   Number of children: 2   Years of education: Not on file   Highest education level: Not on file  Occupational History   Not on file  Tobacco Use   Smoking status: Never   Smokeless tobacco: Never  Vaping Use   Vaping Use: Never used  Substance and Sexual Activity   Alcohol use: Not Currently    Alcohol/week: 0.0 standard drinks of alcohol   Drug use: No   Sexual activity: Not on file  Other Topics Concern   Not on file  Social History Narrative   Divorced.  G3P2.   Regular exercise-yes   Social Determinants of Health   Financial Resource Strain: Low Risk  (01/06/2022)   Overall Financial Resource Strain (CARDIA)    Difficulty of Paying Living Expenses: Not hard at all  Food Insecurity: No Food Insecurity (06/30/2022)   Hunger Vital Sign    Worried About Running Out of Food in the Last Year: Never true    Ran Out of Food in the Last Year: Never true  Transportation Needs: No Transportation Needs (06/30/2022)   PRAPARE - Hydrologist (Medical): No    Lack of Transportation (Non-Medical): No  Physical Activity: Insufficiently Active (01/06/2022)   Exercise Vital Sign    Days of Exercise per Week: 3 days    Minutes of Exercise per Session: 30 min  Stress: No Stress Concern Present (01/06/2022)   [place]    Feeling of Stress : Not at all  Social Connections: Moderately Isolated (01/06/2022)   Social Connection and Isolation Panel [NHANES]    Frequency of Communication with Friends and Family: More than three times a week    Frequency of Social Gatherings with Friends and Family: Once a week    Attends Religious Services: More than 4 times per year    Active Member of [employer] or Organizations: No    Attends Music therapist: Never    Marital Status: Divorced      Family History:  The patient's family history includes Diabetes in her father; Hydrocephalus in her mother; Hypertension in her father; Neuropathy in her mother; Thyroid cancer in her cousin; Thyroid disease in her maternal aunt.  ROS:   Please see the history of present illness.  All other systems are reviewed and otherwise negative.    EKG(s)/Additional Testing   EKG:  EKG is ordered today, personally reviewed, demonstrating SB 59bpm, nonspecific STTW changes nonacute from prior.  CV Studies: Cardiac studies reviewed are outlined and summarized above. Otherwise please see EMR for full report.  Recent Labs: 06/21/2022: ALT 11; Magnesium 2.3; TSH 1.010 07/01/2022: BUN 10; Creatinine, Ser 0.71; Hemoglobin 12.8; Platelets 228; Potassium 3.9; Sodium 136  Recent Lipid Panel    Component Value Date/Time   CHOL 303 (H) 06/21/2022 1236   TRIG 130 06/21/2022 1236   HDL 69 06/21/2022 1236   CHOLHDL 4.4 06/21/2022 1236   CHOLHDL 3 08/20/2021 0943   VLDL 20.6 08/20/2021 0943   LDLCALC 211 (H) 06/21/2022 1236   LDLDIRECT 197.5 11/02/2011 0819    PHYSICAL EXAM:    VS:  BP 112/68   Pulse (!) 59   Ht 5' 3"$  (1.6 m)   Wt 143 lb (64.9 kg)   SpO2 96%   BMI 25.33 kg/m   BMI: Body mass index is 25.33 kg/m.  GEN: Well nourished, well developed female in no acute distress HEENT: normocephalic, atraumatic Neck: no JVD, carotid bruits, or masses Cardiac: RRR; no murmurs, rubs, or gallops, no edema  Respiratory:  clear to auscultation bilaterally, normal work of breathing GI: soft, nontender, nondistended, + BS MS: no deformity or atrophy. Right radial cath site without hematoma, moderate forearm ecchyomsis in middle stages of resolution with yellowing of bruising, soft; good pulse. Skin: warm and dry, no rash Neuro:  Alert and Oriented x 3, Strength and sensation are intact, follows commands Psych: euthymic mood, full affect  Wt Readings from Last 3 Encounters:  07/09/22 143 lb (64.9 kg)   06/30/22 144 lb 14.4 oz (65.7 kg)  06/21/22 149 lb (67.6 kg)     ASSESSMENT & PLAN:   1. CAD - doing well post PCI. Right forearm hematoma has resolved, ecchymosis is gradually improving. Would advise avoiding lifting in this arm for another week. Continue ASA, BB, Praluent. Echo is pending. I will reach out to Dr. Acie Fredrickson to ensure he is OK with her flying to [place] in a few weeks. Clinically she is doing well. She will notify for  any concerns about her cath site.  2. HLD - recheck fasting lipids/LFTs in 2-3 months. Tolerating re-trial of Praluent thus far.  3. Essential HTN - BP improved with addition of carvedilol. I asked her to continue to follow at home and if she is seeing readings 110 or less to call us in which case we may be able to hold off the carvedilol - she suspects a lot of her high readings pre-cath were driven by her anxiety surrounding the procedure.  4. Mild carotid artery disease (123456 LICA [id number]) - anticipate recheck in about 2 years, can be decided in followup with primary cardiologist.  ADDENDUM: echocardiogram shows mild pulmonary HTN. Patient has reported snoring therefore will pursue sleep study, OK for [place] if patient has smart phone.    Disposition: F/u with me or Dr. Acie Fredrickson in 3-4 months.   Medication Adjustments/Labs and Tests Ordered: Current medicines are reviewed at length with the patient today.  Concerns regarding medicines are outlined above. Medication changes, Labs and Tests ordered today are summarized above and listed in the Patient Instructions accessible in Encounters.   Signed, Charlie Pitter, PA-C  07/09/2022 10:31 AM    [place] Phone: [phone]; Fax: [phone]

## 2022-07-06 NOTE — Telephone Encounter (Signed)
Patient came in to have right wrist assessed following cardiac cath on 06/30/22. She states it has been tender/sore. Noted purple bruising fading to green on edges just below the insertion site extending about 4.5 inches up the forearm. There is no hematoma and the tissue is soft. Patient reports she had a hematoma post procedure that was expressed before she was discharged home.   Advised patient that she could use an ice pack  for tenderness and the bruising and soreness should continue to improve. Please let us know if it becomes worse, or more painful. She is scheduled for post cath follow up on 07/09/22.

## 2022-07-08 ENCOUNTER — Telehealth (HOSPITAL_COMMUNITY): Payer: Self-pay

## 2022-07-08 NOTE — Telephone Encounter (Signed)
Called and spoke with pt in regards to CR, pt stated she is working out at home.   Closed referral

## 2022-07-09 ENCOUNTER — Ambulatory Visit: Payer: PPO | Attending: Physician Assistant | Admitting: Physician Assistant

## 2022-07-09 ENCOUNTER — Encounter: Payer: Self-pay | Admitting: Physician Assistant

## 2022-07-09 VITALS — BP 112/68 | HR 59 | Ht 63.0 in | Wt 143.0 lb

## 2022-07-09 DIAGNOSIS — E785 Hyperlipidemia, unspecified: Secondary | ICD-10-CM | POA: Diagnosis not present

## 2022-07-09 DIAGNOSIS — I779 Disorder of arteries and arterioles, unspecified: Secondary | ICD-10-CM

## 2022-07-09 DIAGNOSIS — I10 Essential (primary) hypertension: Secondary | ICD-10-CM

## 2022-07-09 DIAGNOSIS — I2510 Atherosclerotic heart disease of native coronary artery without angina pectoris: Secondary | ICD-10-CM | POA: Diagnosis not present

## 2022-07-09 NOTE — Patient Instructions (Signed)
Medication Instructions:  Your physician recommends that you continue on your current medications as directed. Please refer to the Current Medication list given to you today.  *If you need a refill on your cardiac medications before your next appointment, please call your pharmacy*   Lab Work: Fasting lipids/lft's in 2-3 months If you have labs (blood work) drawn today and your tests are completely normal, you will receive your results only by: [place] (if you have MyChart) OR A paper copy in the mail If you have any lab test that is abnormal or we need to change your treatment, we will call you to review the results.   Testing/Procedures: Echo on 07/13/22 at 1:45 PM as scheduled   Follow-Up: At [facility], you and your health needs are our priority.  As part of our continuing mission to provide you with exceptional heart care, we have created designated Provider Care Teams.  These Care Teams include your primary Cardiologist (physician) and Advanced Practice Providers (APPs -  Physician Assistants and Nurse Practitioners) who all work together to provide you with the care you need, when you need it.   Your next appointment:   3-4 month(s)  Provider:   Mertie Moores, MD  or Melina Copa, PA-C

## 2022-07-12 ENCOUNTER — Ambulatory Visit: Payer: PPO | Admitting: Cardiovascular Disease

## 2022-07-13 ENCOUNTER — Ambulatory Visit (HOSPITAL_COMMUNITY): Payer: PPO | Attending: Physician Assistant

## 2022-07-13 DIAGNOSIS — I779 Disorder of arteries and arterioles, unspecified: Secondary | ICD-10-CM | POA: Insufficient documentation

## 2022-07-13 DIAGNOSIS — I2510 Atherosclerotic heart disease of native coronary artery without angina pectoris: Secondary | ICD-10-CM | POA: Diagnosis not present

## 2022-07-13 DIAGNOSIS — I081 Rheumatic disorders of both mitral and tricuspid valves: Secondary | ICD-10-CM | POA: Insufficient documentation

## 2022-07-13 DIAGNOSIS — I10 Essential (primary) hypertension: Secondary | ICD-10-CM | POA: Insufficient documentation

## 2022-07-13 DIAGNOSIS — E785 Hyperlipidemia, unspecified: Secondary | ICD-10-CM | POA: Insufficient documentation

## 2022-07-13 DIAGNOSIS — R202 Paresthesia of skin: Secondary | ICD-10-CM | POA: Diagnosis not present

## 2022-07-13 LAB — ECHOCARDIOGRAM COMPLETE
Area-P 1/2: 2.95 cm2
S' Lateral: 2.5 cm

## 2022-07-15 ENCOUNTER — Telehealth: Payer: Self-pay | Admitting: *Deleted

## 2022-07-15 ENCOUNTER — Ambulatory Visit: Payer: PPO | Attending: Internal Medicine | Admitting: Internal Medicine

## 2022-07-15 ENCOUNTER — Encounter: Payer: Self-pay | Admitting: Internal Medicine

## 2022-07-15 VITALS — BP 118/70 | HR 67 | Ht 63.0 in | Wt 147.0 lb

## 2022-07-15 DIAGNOSIS — I2510 Atherosclerotic heart disease of native coronary artery without angina pectoris: Secondary | ICD-10-CM

## 2022-07-15 DIAGNOSIS — E782 Mixed hyperlipidemia: Secondary | ICD-10-CM | POA: Diagnosis not present

## 2022-07-15 DIAGNOSIS — I10 Essential (primary) hypertension: Secondary | ICD-10-CM

## 2022-07-15 DIAGNOSIS — R0683 Snoring: Secondary | ICD-10-CM

## 2022-07-15 MED ORDER — METOPROLOL TARTRATE 25 MG PO TABS: 25.0000 mg | ORAL_TABLET | Freq: Two times a day (BID) | ORAL | 3 refills | Status: DC

## 2022-07-15 NOTE — Telephone Encounter (Signed)
Unfortunately it is impossible to assess these kinds of symptoms without in person evaluation. Needs to return for repeat OV with EKG. If active symptoms -> ER. Thank you.

## 2022-07-15 NOTE — Progress Notes (Signed)
Cardiology Office Note:    Date:  07/15/2022   ID:  Nancy [name], DOB [birth year], MRN [id number]  PCP:  Pleas Koch, NP   [place] [facility] Cardiologist:  Mertie Moores, MD     Referring MD: Pleas Koch, NP   CC:  DOD CP  History of Present Illness:    Nancy [name] is a 67 y.o. female with a hx of CAD, HTN, and HLD. Seen as urgent add on for chest pain.  Prior to LAD intervention, had left arm and leg tingling.  This did not improve after PCI but was rare in nature. 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 Bucked off a horse at ge 59 with rip pain painful to touch Discomfort between shoulder blades Left arm and leg tingling Right arm and leg tingling Has had fusions in neck Felt better after PCI with better balance no other changes.  Has new orthostasis on coreg. Did well with metoprolol in the past.  Only notices above at night in a chair. Never taking any nitroglycerin because she has never had any pain   Past Medical History:  Diagnosis Date   Allergy to alpha-gal    Breast cancer ([place]) 10/23/2020   CAD (coronary artery disease)    Chronic neck pain 04/11/2018   Coronary artery calcification seen on CAT scan    Eczema    Essential hypertension 07/20/2019   Fatigue    GAD (generalized anxiety disorder) 06/26/2018   Hyperlipidemia    Lichen sclerosus    Overweight (BMI 25.0-29.9) 01/09/2018   Recurrent upper respiratory infection (URI)    Routine general medical examination at a health care facility    Urinary frequency 06/06/2019   Urticaria    Vaginal itching 06/06/2019    Past Surgical History:  Procedure Laterality Date   ANTERIOR CERVICAL DECOMP/DISCECTOMY FUSION     BREAST LUMPECTOMY WITH RADIOACTIVE SEED LOCALIZATION Left 11/14/2020   Procedure: LEFT BREAST LUMPECTOMY WITH RADIOACTIVE SEED LOCALIZATION;  Surgeon: Erroll Luna, MD;  Location: [facility];  Service: General;   Laterality: Left;   [place] and 1983   COLONOSCOPY WITH PROPOFOL N/A 10/06/2020   Procedure: COLONOSCOPY WITH PROPOFOL;  Surgeon: Virgel Manifold, MD;  Location: [facility];  Service: Endoscopy;  Laterality: N/A;   CORONARY STENT INTERVENTION N/A 06/30/2022   Procedure: CORONARY STENT INTERVENTION;  Surgeon: Sherren Mocha, MD;  Location: [facility];  Service: Cardiovascular;  Laterality: N/A;   LEFT HEART CATH AND CORONARY ANGIOGRAPHY N/A 06/30/2022   Procedure: LEFT HEART CATH AND CORONARY ANGIOGRAPHY;  Surgeon: Sherren Mocha, MD;  Location: [facility];  Service: Cardiovascular;  Laterality: N/A;   PARTIAL HYSTERECTOMY     TUBAL LIGATION  1985    Current Medications: Current Meds  Medication Sig   metoprolol tartrate (LOPRESSOR) 25 MG tablet Take 1 tablet (25 mg total) by mouth 2 (two) times daily.     Allergies:   Alpha-gal, Macrobid [nitrofurantoin], Atorvastatin, Lisinopril, Milk protein, Nitroglycerin, Other, Praluent [alirocumab], Pravastatin, Repatha [evolocumab], Rosuvastatin, Shellfish allergy, Simvastatin, Statins, and Zetia [ezetimibe]   Social History   Socioeconomic History   Marital status: Single    Spouse name: Not on file   Number of children: 2   Years of education: Not on file   Highest education level: Not on file  Occupational History   Not on file  Tobacco Use   Smoking status:  Never   Smokeless tobacco: Never  Vaping Use   Vaping Use: Never used  Substance and Sexual Activity   Alcohol use: Not Currently    Alcohol/week: 0.0 standard drinks of alcohol   Drug use: No   Sexual activity: Not on file  Other Topics Concern   Not on file  Social History Narrative   Divorced.  G3P2.   Regular exercise-yes   Social Determinants of Health   Financial Resource Strain: Low Risk  (01/06/2022)   Overall Financial Resource Strain (CARDIA)    Difficulty of Paying Living Expenses: Not hard at all  Food Insecurity:  No Food Insecurity (06/30/2022)   Hunger Vital Sign    Worried About Running Out of Food in the Last Year: Never true    Ran Out of Food in the Last Year: Never true  Transportation Needs: No Transportation Needs (06/30/2022)   PRAPARE - Hydrologist (Medical): No    Lack of Transportation (Non-Medical): No  Physical Activity: Insufficiently Active (01/06/2022)   Exercise Vital Sign    Days of Exercise per Week: 3 days    Minutes of Exercise per Session: 30 min  Stress: No Stress Concern Present (01/06/2022)   [place]    Feeling of Stress : Not at all  Social Connections: Moderately Isolated (01/06/2022)   Social Connection and Isolation Panel [NHANES]    Frequency of Communication with Friends and Family: More than three times a week    Frequency of Social Gatherings with Friends and Family: Once a week    Attends Religious Services: More than 4 times per year    Active Member of [employer] or Organizations: No    Attends Music therapist: Never    Marital Status: Divorced     Family History: The patient's family history includes Diabetes in her father; Hydrocephalus in her mother; Hypertension in her father; Neuropathy in her mother; Thyroid cancer in her cousin; Thyroid disease in her maternal aunt.  ROS:   Please see the history of present illness.     All other systems reviewed and are negative.  EKGs/Labs/Other Studies Reviewed:    The following studies were reviewed today:  EKG:   07/15/22: SR  07/09/22: sinus bradycardia 59   Cardiac Studies & Procedures   CARDIAC CATHETERIZATION  CARDIAC CATHETERIZATION 06/30/2022  Narrative Severe single vessel CAD with tandem 50% and 80% lesions in the mid-LAD, treated successfully with a 2.75x28 mm Synergy DES post-dilated to high pressure with a 3.0 mm [place] balloon Nonobstructive RCA stenosis Patent left main and LCx without  significant stenosis  Recommend overnight observation because of late in day procedure time. ASA/clopidogrel x 6 months minimum. GDMT/risk reduction measures for secondary prevention.  Findings Coronary Findings Diagnostic  Dominance: Right  Left Main Vessel is angiographically normal. Short left main  Left Anterior Descending Mid LAD-1 lesion is 50% stenosed. Mid LAD-2 lesion is 80% stenosed. The lesion is eccentric.  Left Circumflex The vessel exhibits minimal luminal irregularities.  Right Coronary Artery Prox RCA to Mid RCA lesion is 40% stenosed.  Intervention  Mid LAD-1 lesion Stent (Also treats lesions: Mid LAD-2) Pre-stent angioplasty was performed using a BALLN SAPPHIRE 2.5X15. A drug-eluting stent was successfully placed using a SYNERGY XD 2.75X28. Post-stent angioplasty was performed using a BALL SAPPHIRE NC24 3.0X15. Maximum pressure:  18 atm. Post-Intervention Lesion Assessment The intervention was successful. Pre-interventional TIMI flow is 3. Post-intervention TIMI flow is  3. No complications occurred at this lesion. There is a 0% residual stenosis post intervention.  Mid LAD-2 lesion Stent (Also treats lesions: Mid LAD-1) See details in Mid LAD-1 lesion. Post-Intervention Lesion Assessment The intervention was successful. Pre-interventional TIMI flow is 3. Post-intervention TIMI flow is 3. No complications occurred at this lesion. There is a 10% residual stenosis post intervention.     ECHOCARDIOGRAM  ECHOCARDIOGRAM COMPLETE 07/13/2022  Narrative ECHOCARDIOGRAM REPORT    Patient Name:   Nancy [name] Date of Exam: 07/13/2022 Medical Rec #:  [id number]      Height:       63.0 in Accession #:    [id number]     Weight:       143.0 lb Date of Birth:  09/26/1955      BSA:          1.677 m Patient Age:    2 years       BP:           112/68 mmHg Patient Gender: F              HR:           64 bpm. Exam Location:  [address]  Procedure: 2D Echo,  Cardiac Doppler, Color Doppler and Strain Analysis  Indications:    R20.2 Paresthesia of left arm  History:        Patient has no prior history of Echocardiogram examinations. CAD, Paresthesia of left arm; Risk Factors:Hypertension and Dyslipidemia.  Sonographer:    Coralyn Helling RDCS Referring Phys: [place]   1. Left ventricular ejection fraction, by estimation, is 60 to 65%. The left ventricle has normal function. The left ventricle has no regional wall motion abnormalities. There is mild left ventricular hypertrophy. Left ventricular diastolic parameters were normal. 2. Right ventricular systolic function is normal. The right ventricular size is normal. There is mildly elevated pulmonary artery systolic pressure. The estimated right ventricular systolic pressure is 95.1 mmHg. 3. The mitral valve is grossly normal. Mild mitral valve regurgitation. No evidence of mitral stenosis. 4. Tricuspid valve regurgitation is mild to moderate. 5. The aortic valve is tricuspid. There is mild calcification of the aortic valve. Aortic valve regurgitation is not visualized. No aortic stenosis is present. 6. The inferior vena cava is normal in size with greater than 50% respiratory variability, suggesting right atrial pressure of 3 mmHg.  FINDINGS Left Ventricle: Left ventricular ejection fraction, by estimation, is 60 to 65%. The left ventricle has normal function. The left ventricle has no regional wall motion abnormalities. Global longitudinal strain performed but not reported based on interpreter judgement due to suboptimal tracking. The left ventricular internal cavity size was normal in size. There is mild left ventricular hypertrophy. Left ventricular diastolic parameters were normal.  Right Ventricle: The right ventricular size is normal. No increase in right ventricular wall thickness. Right ventricular systolic function is normal. There is mildly elevated pulmonary  artery systolic pressure. The tricuspid regurgitant velocity is 3.05 m/s, and with an assumed right atrial pressure of 3 mmHg, the estimated right ventricular systolic pressure is 88.4 mmHg.  Left Atrium: Left atrial size was normal in size.  Right Atrium: Right atrial size was normal in size.  Pericardium: There is no evidence of pericardial effusion.  Mitral Valve: The mitral valve is grossly normal. Mild mitral valve regurgitation. No evidence of mitral valve stenosis.  Tricuspid Valve: The tricuspid valve is normal in structure. Tricuspid valve regurgitation is mild  to moderate. No evidence of tricuspid stenosis.  Aortic Valve: The aortic valve is tricuspid. There is mild calcification of the aortic valve. Aortic valve regurgitation is not visualized. No aortic stenosis is present.  Pulmonic Valve: The pulmonic valve was normal in structure. Pulmonic valve regurgitation is not visualized. No evidence of pulmonic stenosis.  Aorta: The aortic root is normal in size and structure.  Venous: The inferior vena cava is normal in size with greater than 50% respiratory variability, suggesting right atrial pressure of 3 mmHg.  IAS/Shunts: The atrial septum is grossly normal.   LEFT VENTRICLE PLAX 2D LVIDd:         3.90 cm   Diastology LVIDs:         2.50 cm   LV e' medial:    8.05 cm/s LV PW:         1.10 cm   LV E/e' medial:  12.9 LV IVS:        1.00 cm   LV e' lateral:   10.70 cm/s LVOT diam:     1.80 cm   LV E/e' lateral: 9.7 LV SV:         52 LV SV Index:   31 LVOT Area:     2.54 cm   RIGHT VENTRICLE             IVC RV S prime:     13.10 cm/s  IVC diam: 1.10 cm TAPSE (M-mode): 2.3 cm RVSP:           40.2 mmHg  LEFT ATRIUM             Index        RIGHT ATRIUM           Index LA diam:        3.30 cm 1.97 cm/m   RA Pressure: 3.00 mmHg LA Vol (A2C):   42.3 ml 25.23 ml/m  RA Area:     11.80 cm LA Vol (A4C):   47.6 ml 28.39 ml/m  RA Volume:   27.50 ml  16.40 ml/m LA  Biplane Vol: 46.3 ml 27.61 ml/m AORTIC VALVE LVOT Vmax:   90.20 cm/s LVOT Vmean:  52.500 cm/s LVOT VTI:    0.204 m  AORTA Ao Root diam: 2.90 cm Ao Asc diam:  3.30 cm  MITRAL VALVE                TRICUSPID VALVE MV Area (PHT): 2.95 cm     TR Peak grad:   37.2 mmHg MV Decel Time: 257 msec     TR Vmax:        305.00 cm/s MV E velocity: 104.00 cm/s  Estimated RAP:  3.00 mmHg MV A velocity: 65.50 cm/s   RVSP:           40.2 mmHg MV E/A ratio:  1.59 SHUNTS Systemic VTI:  0.20 m Systemic Diam: 1.80 cm  Cherlynn Kaiser MD Electronically signed by Cherlynn Kaiser MD Signature Date/Time: 07/13/2022/9:02:22 PM    Final     CT SCANS  CT CORONARY MORPH W/CTA COR W/SCORE 06/24/2022  Addendum 06/24/2022 11:57 AM ADDENDUM REPORT: 06/24/2022 11:55  ADDENDUM: OVER-READ INTERPRETATION  CT CHEST  The following report is an over-read performed by radiologist Dr. Minerva Fester [facility] on 06/24/2022. This over-read does not include interpretation of cardiac or coronary anatomy or pathology. The coronary calcium and coronary CT angiography interpretation by the cardiologist is attached. Imaging of the chest is focused on cardiac structures  and excludes much of the chest on CT.  COMPARISON:  None available  FINDINGS:  Cardiovascular: See dedicated report for cardiovascular details.  Mediastinum/Nodes: No adenopathy or acute process in the mediastinum.  Lungs/Pleura: Mild basilar atelectasis. No pleural or pericardial effusion to the extent evaluated. Airways are unremarkable to the extent imaged on today's study.  Upper Abdomen: Incidental imaging of upper abdominal contents without acute process.  Musculoskeletal: No acute or destructive bone finding.  IMPRESSION:  No acute or significant extracardiac findings.   Electronically Signed By: Zetta Bills M.D. On: 06/24/2022 11:55  Narrative CLINICAL DATA:  Chest pain  EXAM: Cardiac/Coronary  CTA  TECHNIQUE: A non-contrast, gated CT scan was obtained with axial slices of 3 mm through the heart for calcium scoring. Calcium scoring was performed using the Agatston method. A 120 kV prospective, gated, contrast cardiac scan was obtained. Gantry rotation speed was 250 msecs and collimation was 0.6 mm. Two sublingual nitroglycerin tablets (0.8 mg) were given. The 3D data set was reconstructed in 5% intervals of the 35-75% of the R-R cycle. Diastolic phases were analyzed on a dedicated workstation using MPR, MIP, and VRT modes. The patient received 95 cc of contrast.  FINDINGS: Image quality: Good.  Slab artifact noted.  Noise artifact is: Limited.  Coronary Arteries:  Normal coronary origin.  Right dominance.  Left main: The left main is a large caliber vessel with a normal take off from the left coronary cusp that bifurcates to form a left anterior descending artery and a left circumflex artery. There is no plaque or stenosis.  Left anterior descending artery: The proximal LAD contains minimal calcified plaque (<25%). The mid to distal LAD contains a moderate to severe non-calcified stenosis (50-70%). The LAD gives off 2 patent diagonal branches.  Left circumflex artery: The LCX is non-dominant. The LCX contains minimal calcified plaque (<25%). The LCX gives off 2 patent obtuse marginal branches.  Right coronary artery: The RCA is dominant with normal take off from the right coronary cusp. The proximal and mid RCA segments contains mild calcified plaque (25-49%). The distal RCA is non-diagnostic due to slab artifact.  Right Atrium: Right atrial size is within normal limits.  Right Ventricle: The right ventricular cavity is within normal limits.  Left Atrium: Left atrial size is normal in size with no left atrial appendage filling defect. Small PFO.  Left Ventricle: The ventricular cavity size is within normal limits.  Pulmonary arteries: Normal in size without  proximal filling defect.  Pulmonary veins: Normal pulmonary venous drainage.  Pericardium: Normal thickness without significant effusion or calcium present.  Cardiac valves: The aortic valve is trileaflet without significant calcification. The mitral valve is normal without significant calcification.  Aorta: Normal caliber without significant disease.  Extra-cardiac findings: See attached radiology report for non-cardiac structures.  IMPRESSION: 1. Coronary calcium score of 260. This was 89th percentile for age-, sex, and race-matched controls.  2. Normal coronary origin with right dominance.  3. Mid to distal LAD contains a moderate to severe non-calcified plaque (50-70%).  4. Minimal calcified plaque (<25%) in the LCX.  5. Mild calcified plaque in the proximal and mid RCA (25-49%).  6. Distal RCA non-diagnostic due to slab artifact.  7. Small PFO.  RECOMMENDATIONS: 1. Moderate to severe stenosis in the LAD. Consider symptom-guided anti-ischemic pharmacotherapy as well as risk factor modification per guideline directed care. Additional analysis with CT FFR will be submitted.  Eleonore Chiquito, MD  Electronically Signed: By: Eleonore Chiquito M.D. On: 06/23/2022  17:07   CT SCANS  CT CARDIAC SCORING (SELF PAY ONLY) 08/22/2019  Addendum 08/22/2019  8:31 PM ADDENDUM REPORT: 08/22/2019 20:28  CLINICAL DATA:  Risk stratification  EXAM: Coronary Calcium Score  TECHNIQUE: The patient was scanned on a [employer] scanner. Axial non-contrast 3 mm slices were carried out through the heart. The data set was analyzed on a dedicated work station and scored using the [place].  FINDINGS: Non-cardiac: See separate report from [facility].  Ascending Aorta: Normal Caliber. Scattered calcifications in the ascending and descending aorta.  Pericardium: Normal  Coronary arteries: Normal coronary origins. Scattered calcifications in the LAD, LCx and  RCA.  IMPRESSION: Coronary calcium score of 243. This was 91st percentile for age and sex matched control.  Fransico Him   Electronically Signed By: Fransico Him On: 08/22/2019 20:28  Narrative EXAM: OVER-READ INTERPRETATION  CT CHEST  The following report is an over-read performed by radiologist Dr. Rolm Baptise of [facility] [place] on 08/22/2019. This over-read does not include interpretation of cardiac or coronary anatomy or pathology. The coronary calcium score interpretation by the cardiologist is attached.  COMPARISON:  None.  FINDINGS: Vascular: Heart is normal size. Aorta is normal caliber. Scattered calcifications in the aortic root and visualized descending thoracic aorta.  Mediastinum/Nodes: No adenopathy in the lower mediastinum or hila.  Lungs/Pleura: No confluent opacities or effusions.  Upper Abdomen: Imaging into the upper abdomen shows no acute findings.  Musculoskeletal: Chest wall soft tissues are unremarkable. No acute bony abnormality.  IMPRESSION: No acute extra cardiac abnormality.  Aortic atherosclerosis.  Electronically Signed: By: Rolm Baptise M.D. On: 08/22/2019 09:07           Recent Labs: 06/21/2022: ALT 11; Magnesium 2.3; TSH 1.010 07/01/2022: BUN 10; Creatinine, Ser 0.71; Hemoglobin 12.8; Platelets 228; Potassium 3.9; Sodium 136  Recent Lipid Panel    Component Value Date/Time   CHOL 303 (H) 06/21/2022 1236   TRIG 130 06/21/2022 1236   HDL 69 06/21/2022 1236   CHOLHDL 4.4 06/21/2022 1236   CHOLHDL 3 08/20/2021 0943   VLDL 20.6 08/20/2021 0943   LDLCALC 211 (H) 06/21/2022 1236   LDLDIRECT 197.5 11/02/2011 0819  STOP-Bang Score:  3       Physical Exam:    VS:  BP 118/70   Pulse 67   Ht '5\' 3"'$  (1.6 m)   Wt 147 lb (66.7 kg)   SpO2 98%   BMI 26.04 kg/m     Wt Readings from Last 3 Encounters:  07/15/22 147 lb (66.7 kg)  07/09/22 143 lb (64.9 kg)  06/30/22 144 lb 14.4 oz (65.7 kg)    GEN:  Well nourished,  well developed in no acute distress HEENT: Normal NECK: No JVD CARDIAC: RRR, no murmurs, rubs, gallops RESPIRATORY:  Clear to auscultation without rales, wheezing or rhonchi  ABDOMEN: Soft, non-tender, non-distended MUSCULOSKELETAL:  No edema; No deformity  SKIN: Warm and dry NEUROLOGIC:  Alert and oriented x 3 PSYCHIATRIC:  Normal affect   ASSESSMENT:    1. CAD in native artery   2. Coronary artery disease involving native coronary artery of native heart without angina pectoris   3. Mixed hyperlipidemia   4. Essential hypertension    PLAN:    CAD With HTN and HLD With statin myopathy - this does not appear to sound cardiac in origin - she has read the package insert for COREG- I do not suspect it is the cause of her myalgias but will change to metoprolol  as she did well with this in the past - has PRN nitro for her trip to [facility]  Mild to moderate TR - reviewed echo with patient - echo in one year  Will push back f/u with Dr. Acie Fredrickson given her      Medication Adjustments/Labs and Tests Ordered: Current medicines are reviewed at length with the patient today.  Concerns regarding medicines are outlined above.  Orders Placed This Encounter  Procedures   EKG 12-Lead   Meds ordered this encounter  Medications   metoprolol tartrate (LOPRESSOR) 25 MG tablet    Sig: Take 1 tablet (25 mg total) by mouth 2 (two) times daily.    Dispense:  180 tablet    Refill:  3    Patient Instructions  Medication Instructions:  Your physician has recommended you make the following change in your medication:  STOP: carvedilol (Coreg)   START: metoprolol tartrate (Lopressor) 25 mg by mouth twice daily  *If you need a refill on your cardiac medications before your next appointment, please call your pharmacy*   Lab Work: NONE  If you have labs (blood work) drawn today and your tests are completely normal, you will receive your results only by: [place] (if you have  MyChart) OR A paper copy in the mail If you have any lab test that is abnormal or we need to change your treatment, we will call you to review the results.   Testing/Procedures: NONE   Follow-Up:As scheduled At [facility], you and your health needs are our priority.  As part of our continuing mission to provide you with exceptional heart care, we have created designated Provider Care Teams.  These Care Teams include your primary Cardiologist (physician) and Advanced Practice Providers (APPs -  Physician Assistants and Nurse Practitioners) who all work together to provide you with the care you need, when you need it.    Signed, Werner Lean, MD  07/15/2022 12:31 PM    [place]

## 2022-07-15 NOTE — Telephone Encounter (Signed)
Sleep Apnea Evaluation  [facility]  Today's Date: 07/15/2022   Patient Name: Nancy [name]        DOB: April 23, 1956       Height:        Weight:    BMI: There is no height or weight on file to calculate BMI.    Referring Provider:  Melina Copa, PAC   STOP-BANG RISK ASSESSMENT         If STOP-BANG Score ?3 OR two clinical symptoms - patient qualifies for WatchPAT (CPT 95800)      Sleep study ordered due to two (2) of the following clinical symptoms/diagnoses:  Excessive daytime sleepiness G47.10  Gastroesophageal reflux K21.9  Nocturia R35.1  Morning Headaches G44.221  Difficulty concentrating R41.840  Memory problems or poor judgment G31.84  Personality changes or irritability R45.4  Loud snoring R06.83  Depression F32.9  Unrefreshed by sleep G47.8  Impotence N52.9  History of high blood pressure R03.0  Insomnia G47.00  Sleep Disordered Breathing or Sleep Apnea ICD G47.33

## 2022-07-15 NOTE — Telephone Encounter (Signed)
Spoke with patient and scheduled appt to see Dr. Acie Fredrickson at next available on 07/23/22 for evaluation of symptoms.  Patient expressed appreciation for follow-up.

## 2022-07-15 NOTE — Telephone Encounter (Signed)
Spoke with patient. She accepted 2/1 appointment at 10:40 with Dr. Gasper Sells.

## 2022-07-15 NOTE — Patient Instructions (Signed)
Medication Instructions:  Your physician has recommended you make the following change in your medication:  STOP: carvedilol (Coreg)   START: metoprolol tartrate (Lopressor) 25 mg by mouth twice daily  *If you need a refill on your cardiac medications before your next appointment, please call your pharmacy*   Lab Work: NONE  If you have labs (blood work) drawn today and your tests are completely normal, you will receive your results only by: [place] (if you have MyChart) OR A paper copy in the mail If you have any lab test that is abnormal or we need to change your treatment, we will call you to review the results.   Testing/Procedures: NONE   Follow-Up:As scheduled At [facility], you and your health needs are our priority.  As part of our continuing mission to provide you with exceptional heart care, we have created designated Provider Care Teams.  These Care Teams include your primary Cardiologist (physician) and Advanced Practice Providers (APPs -  Physician Assistants and Nurse Practitioners) who all work together to provide you with the care you need, when you need it.

## 2022-07-23 ENCOUNTER — Ambulatory Visit: Payer: PPO | Admitting: Cardiovascular Disease

## 2022-07-29 ENCOUNTER — Telehealth: Payer: Self-pay | Admitting: Cardiovascular Disease

## 2022-07-29 NOTE — Telephone Encounter (Signed)
I will send a message to the sleep coordinator Gae Bon to see if pt has been approved for [facility] study.    I s/w the pt and informed her that I have sent a message to check on approval and will call back with PIN# once I know if approved. Pt said thank you for the call.

## 2022-07-29 NOTE — Addendum Note (Signed)
Addended by: Freada Bergeron on: 07/29/2022 04:21 PM   Modules accepted: Orders

## 2022-07-29 NOTE — Telephone Encounter (Signed)
Pt states she would be called back with pin # for sleep machine and she is requesting return call to discuss this and get update on when to expect this. Please advise.

## 2022-08-02 ENCOUNTER — Ambulatory Visit: Payer: PPO | Admitting: Physician Assistant

## 2022-08-03 IMAGING — MG MM BREAST LOCALIZATION CLIP
6 series · 6 of 18 positions shown · non-contrast
Comparison: Previous exam(s).

CLINICAL DATA: Post procedure mammogram for clip placement.

EXAM:
DIAGNOSTIC LEFT MAMMOGRAM POST STEREOTACTIC BIOPSY

[L CC synth-2D (1 of 2)]
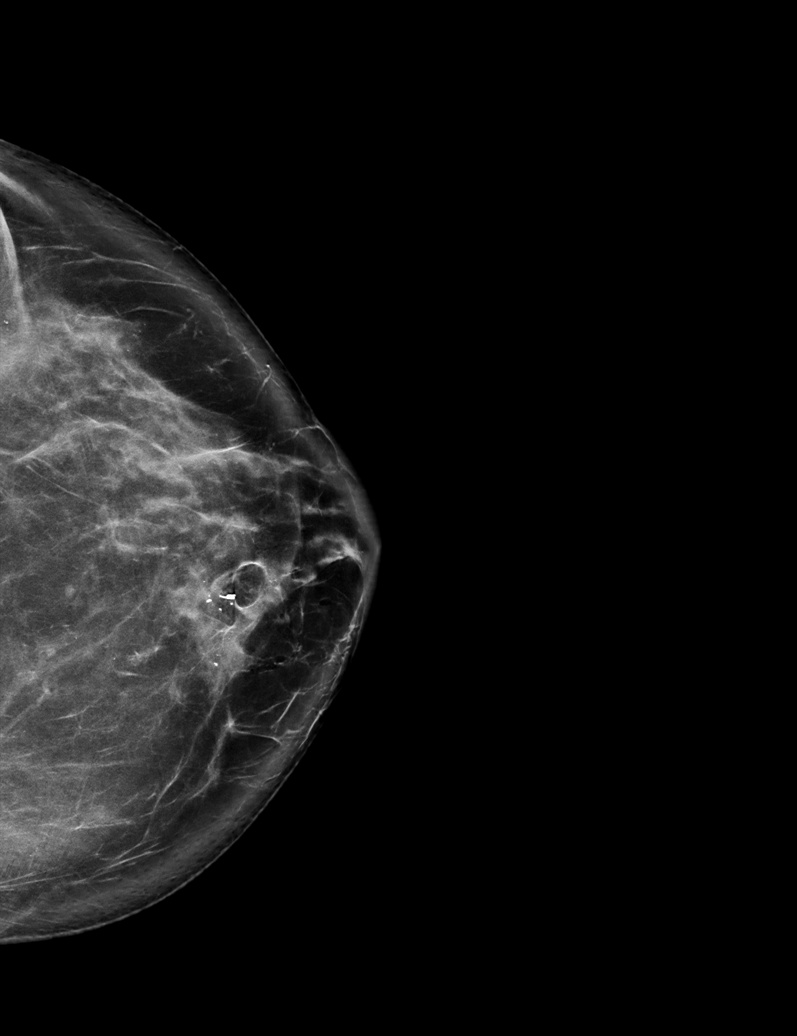

[L CC synth-2D (2 of 2)]
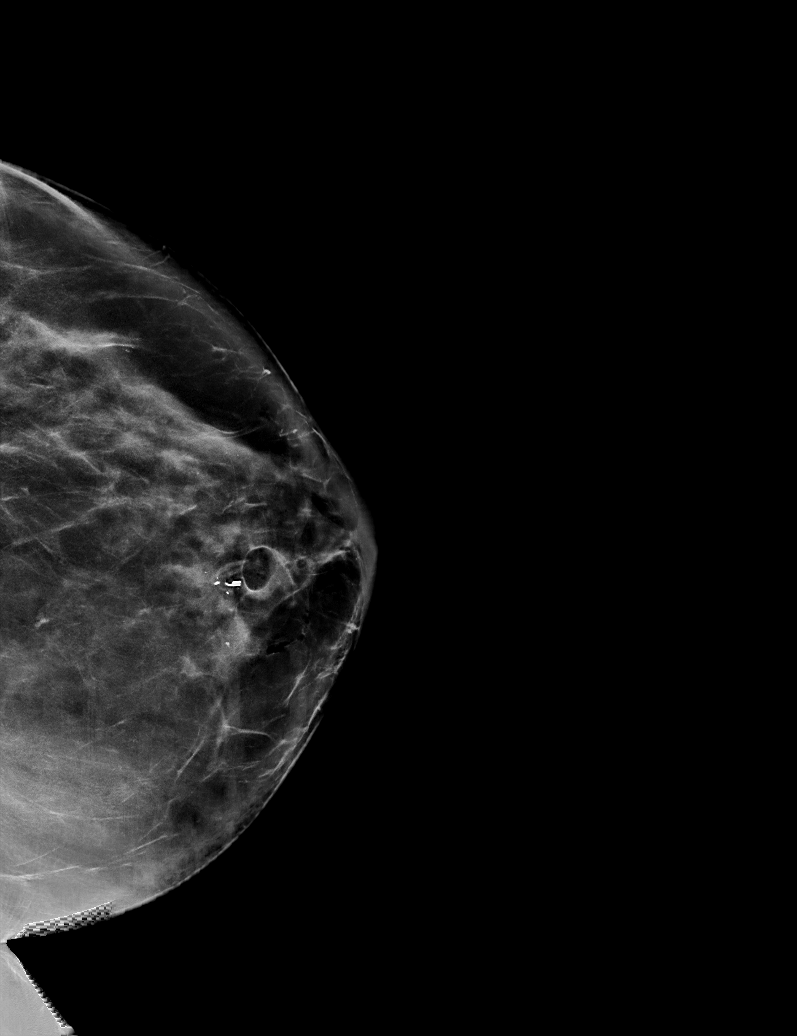

[L ML synth-2D]
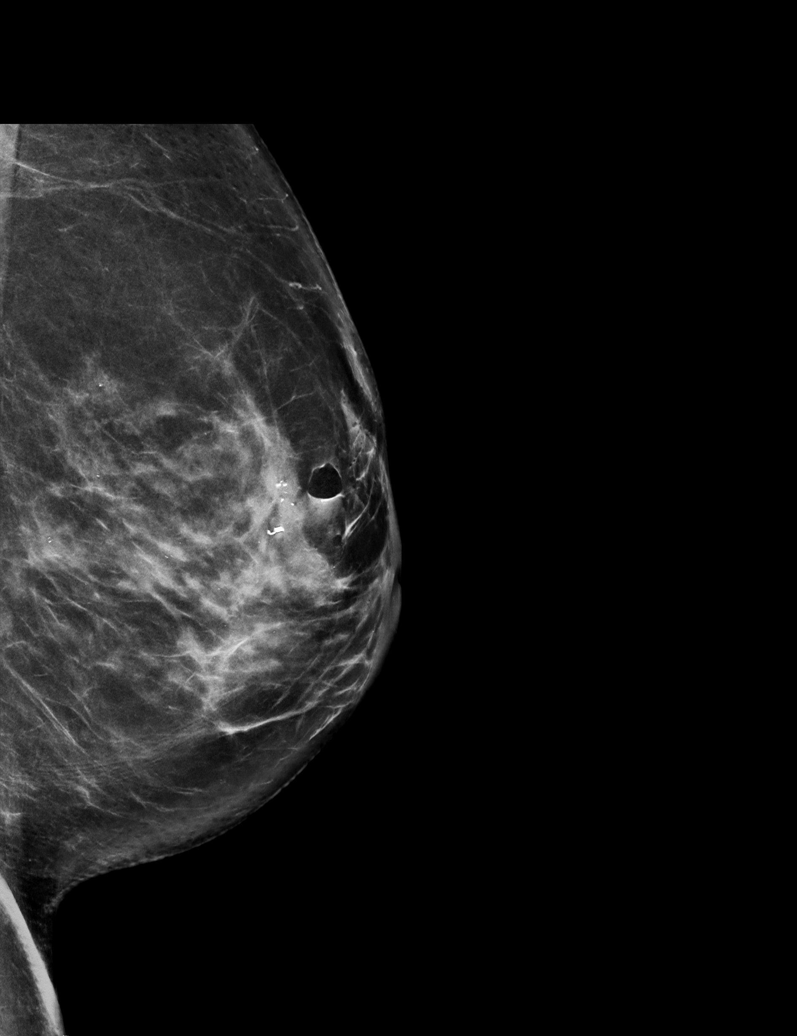

[L CC tomo (1 of 2) · tomo slice 42/83.0]
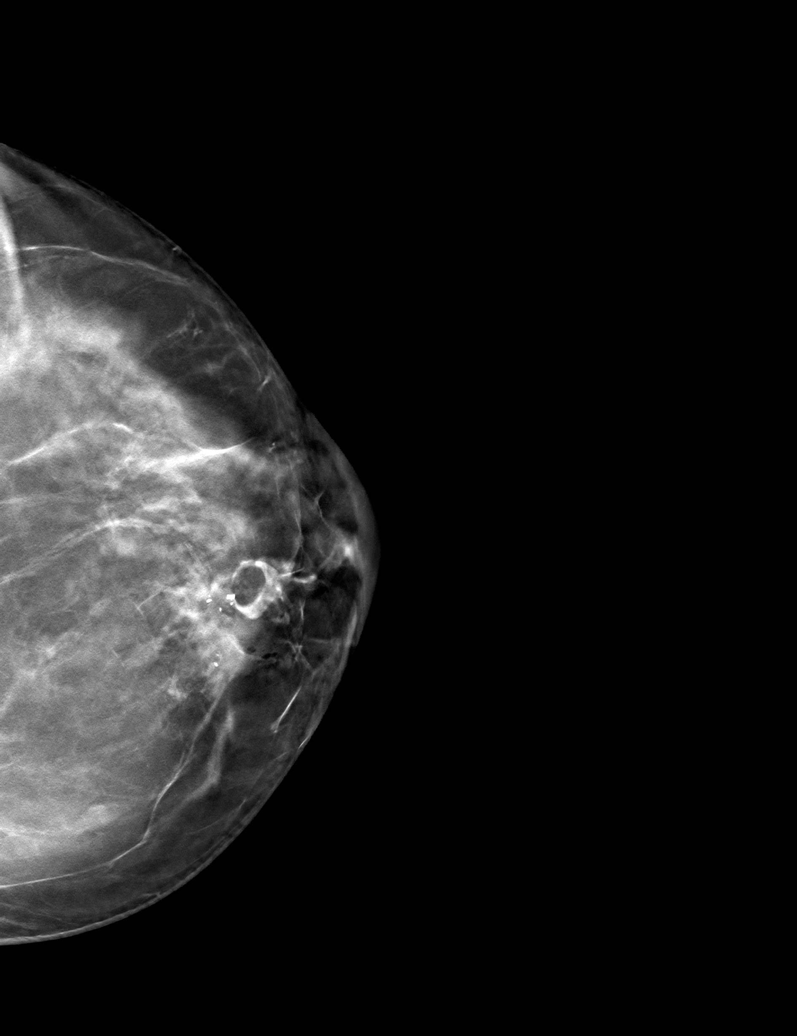

[L CC tomo (2 of 2) · tomo slice 43/85.0]
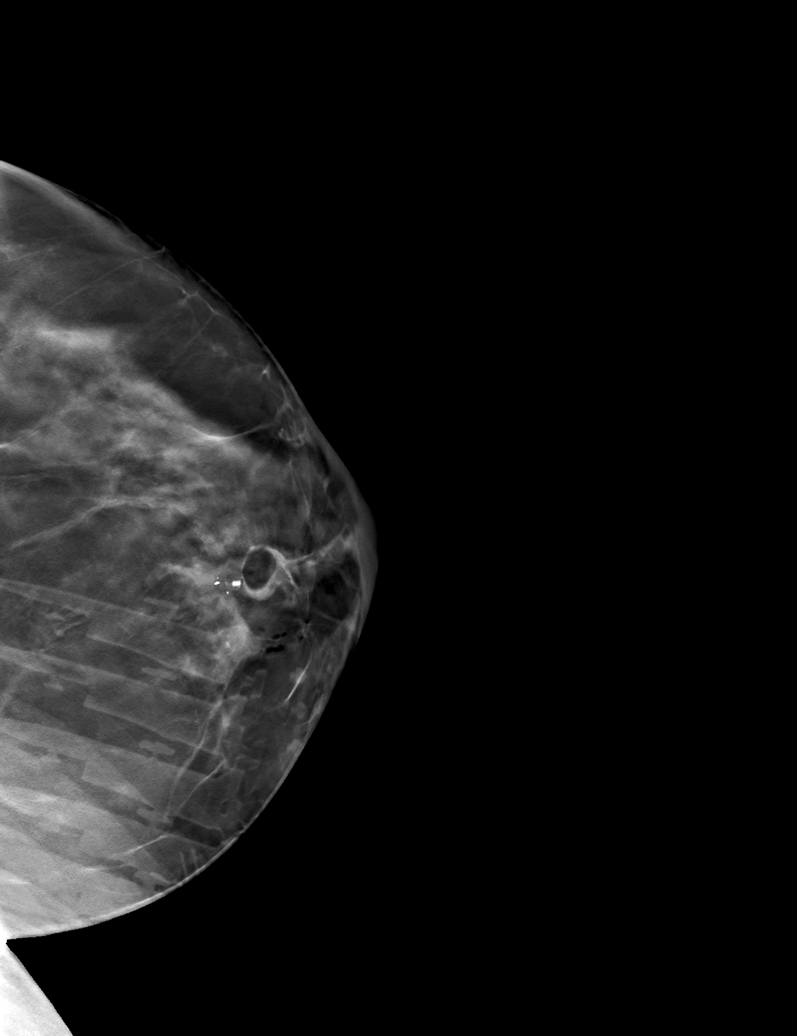

[L ML tomo · tomo slice 39/77.0]
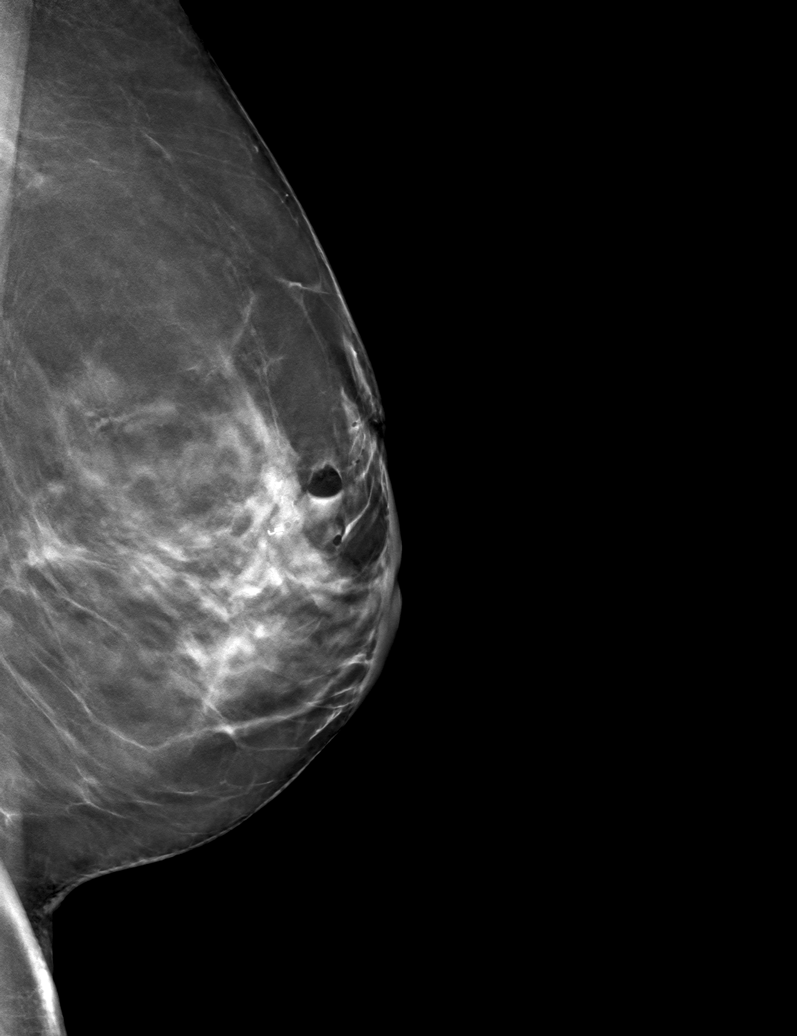

[6 of 18 positions shown; findings below may reference images not displayed]

FINDINGS: Mammographic images were obtained following stereotactic guided
biopsy of distortion with calcifications in the upper inner left
breast. The biopsy marking clip is in expected position at the site
of biopsy.
IMPRESSION: Appropriate positioning of the coil shaped biopsy marking clip at
the site of biopsy in the upper inner left breast.

Final Assessment: Post Procedure Mammograms for Marker Placement

## 2022-08-04 ENCOUNTER — Encounter (INDEPENDENT_AMBULATORY_CARE_PROVIDER_SITE_OTHER): Payer: PPO | Admitting: Cardiology

## 2022-08-04 DIAGNOSIS — G4733 Obstructive sleep apnea (adult) (pediatric): Secondary | ICD-10-CM | POA: Diagnosis not present

## 2022-08-04 NOTE — Telephone Encounter (Signed)
Prior Authorization for [facility] sent to [facility] via web portal. Tracking Number . [id number]

## 2022-08-04 NOTE — Telephone Encounter (Signed)
Called and made the patient aware that she may proceed with the [facility] Sleep Study. PIN # provided to the patient. Patient made aware that she will be contacted after the test has been read with the results and any recommendations. Patient verbalized understanding and thanked me for the call.   Pt has been given PIN# [id number]. Pt will do the study one night this week. Pt aware we will call her with the results and any recommendations once the test has been read by cardiologist.

## 2022-08-04 NOTE — Telephone Encounter (Signed)
Prior Authorization for [facility] sent to [facility] via web portal. Tracking Number . [id number]-[id number]

## 2022-08-05 ENCOUNTER — Ambulatory Visit: Payer: PPO | Attending: Physician Assistant

## 2022-08-05 NOTE — Procedures (Signed)
   SLEEP STUDY REPORT Patient Information Study Date: 08/04/2022 Patient Name: Nancy [name] Patient ID: [id number] Birth Date: Jun 25, 1955 Age: 67 Gender: Female BMI: 25.4 (W=143 lb, H=5' 3'') Stopbang: 3 Referring Physician: Melina Copa, PA  TEST DESCRIPTION: Home sleep apnea testing was completed using the WatchPat, a Type 1 device, utilizing peripheral arterial tonometry (PAT), chest movement, actigraphy, pulse oximetry, pulse rate, body position and snore. AHI was calculated with apnea and hypopnea using valid sleep time as the denominator. RDI includes apneas, hypopneas, and RERAs. The data acquired and the scoring of sleep and all associated events were performed in accordance with the recommended standards and specifications as outlined in the AASM Manual for the Scoring of Sleep and Associated Events 2.2.0 (2015).   FINDINGS: 1.  No evidence of Obstructive Sleep Apnea with AHI 1/hr.  2.  No Central Sleep Apnea. 3.  Oxygen desaturations as low as 85%. 4.  Minimal snoring was present. O2 sats were < 88% for 0 minutes. 5.  Total sleep time was 7 hrs and 50 min. 6.  18.5% of total sleep time was spent in REM sleep.  7.  Normal sleep onset latency at 22 min.  8.  Prolonged REM sleep onset latency at 171 min.  9.  Total awakenings were 4.   DIAGNOSIS:  Normal study with no significant sleep disordered breathing.  RECOMMENDATIONS:   1. Normal study with no significant sleep disordered breathing.  2.  Healthy sleep recommendations include:  adequate nightly sleep (normal 7-9 hrs/night), avoidance of caffeine after noon and alcohol near bedtime, and maintaining a sleep environment that is cool, dark and quiet.  3.  Weight loss for overweight patients is recommended.    4.  Snoring recommendations include:  weight loss where appropriate, side sleeping, and avoidance of alcohol before bed.  5.  Operation of motor vehicle or dangerous equipment must be avoided when feeling drowsy,  excessively sleepy, or mentally fatigued.    6.  An ENT consultation which may be useful for specific causes of and possible treatment of bothersome snoring.   7. Weight loss may be of benefit in reducing the severity of snoring.   Signature: Fransico Him, MD; [facility]; [place], [place] Board of Sleep Medicine Electronically Signed: 08/05/2022

## 2022-08-06 ENCOUNTER — Telehealth: Payer: Self-pay | Admitting: *Deleted

## 2022-08-06 NOTE — Telephone Encounter (Signed)
-----   Message from Lauralee Evener, [place] sent at 08/06/2022  8:53 AM EST -----  ----- Message ----- From: Sueanne Margarita, MD Sent: 08/05/2022  12:04 PM EST To: [facility] Sleep Studies  Please let patient know that sleep study showed no significant sleep apnea.

## 2022-08-06 NOTE — Telephone Encounter (Signed)
The patient has been notified of the result and verbalized understanding.  All questions (if any) were answered. Marolyn Hammock, [place] 08/06/2022 12:34 PM    Pt is aware and agreeable to normal results.

## 2022-08-12 ENCOUNTER — Ambulatory Visit: Payer: PPO | Admitting: Physician Assistant

## 2022-08-16 ENCOUNTER — Other Ambulatory Visit: Payer: Self-pay | Admitting: Primary Care

## 2022-08-16 DIAGNOSIS — F411 Generalized anxiety disorder: Secondary | ICD-10-CM

## 2022-08-25 ENCOUNTER — Encounter: Payer: PPO | Admitting: Primary Care

## 2022-08-31 ENCOUNTER — Ambulatory Visit (INDEPENDENT_AMBULATORY_CARE_PROVIDER_SITE_OTHER): Payer: PPO | Admitting: Primary Care

## 2022-08-31 ENCOUNTER — Telehealth: Payer: Self-pay | Admitting: Cardiovascular Disease

## 2022-08-31 ENCOUNTER — Encounter: Payer: Self-pay | Admitting: Primary Care

## 2022-08-31 VITALS — BP 132/84 | HR 67 | Temp 97.2°F | Ht 63.0 in | Wt 152.0 lb

## 2022-08-31 DIAGNOSIS — L900 Lichen sclerosus et atrophicus: Secondary | ICD-10-CM | POA: Diagnosis not present

## 2022-08-31 DIAGNOSIS — E2839 Other primary ovarian failure: Secondary | ICD-10-CM

## 2022-08-31 DIAGNOSIS — J309 Allergic rhinitis, unspecified: Secondary | ICD-10-CM

## 2022-08-31 DIAGNOSIS — Z91018 Allergy to other foods: Secondary | ICD-10-CM

## 2022-08-31 DIAGNOSIS — J329 Chronic sinusitis, unspecified: Secondary | ICD-10-CM | POA: Diagnosis not present

## 2022-08-31 DIAGNOSIS — E782 Mixed hyperlipidemia: Secondary | ICD-10-CM | POA: Diagnosis not present

## 2022-08-31 DIAGNOSIS — I10 Essential (primary) hypertension: Secondary | ICD-10-CM

## 2022-08-31 DIAGNOSIS — Z0001 Encounter for general adult medical examination with abnormal findings: Secondary | ICD-10-CM | POA: Diagnosis not present

## 2022-08-31 DIAGNOSIS — R7303 Prediabetes: Secondary | ICD-10-CM | POA: Diagnosis not present

## 2022-08-31 DIAGNOSIS — F411 Generalized anxiety disorder: Secondary | ICD-10-CM | POA: Diagnosis not present

## 2022-08-31 DIAGNOSIS — I2510 Atherosclerotic heart disease of native coronary artery without angina pectoris: Secondary | ICD-10-CM

## 2022-08-31 DIAGNOSIS — Z1211 Encounter for screening for malignant neoplasm of colon: Secondary | ICD-10-CM | POA: Diagnosis not present

## 2022-08-31 DIAGNOSIS — D0512 Intraductal carcinoma in situ of left breast: Secondary | ICD-10-CM

## 2022-08-31 LAB — LIPID PANEL
Cholesterol: 196 mg/dL (ref 0–200)
HDL: 74.4 mg/dL (ref >39.00)
LDL Cholesterol: 102 mg/dL — ABNORMAL HIGH (ref 0–99)
NonHDL: 121.47
Total CHOL/HDL Ratio: 3
Triglycerides: 97 mg/dL (ref 0.0–149.0)
VLDL: 19.4 mg/dL (ref 0.0–40.0)

## 2022-08-31 LAB — HEPATIC FUNCTION PANEL
ALT: 14 U/L (ref 0–35)
AST: 18 U/L (ref 0–37)
Albumin: 4 g/dL (ref 3.5–5.2)
Alkaline Phosphatase: 75 U/L (ref 39–117)
Bilirubin, Direct: 0.1 mg/dL (ref 0.0–0.3)
Total Bilirubin: 0.5 mg/dL (ref 0.2–1.2)
Total Protein: 6.6 g/dL (ref 6.0–8.3)

## 2022-08-31 LAB — HEMOGLOBIN A1C: Hgb A1c MFr Bld: 5.9 % (ref 4.6–6.5)

## 2022-08-31 NOTE — Assessment & Plan Note (Signed)
Repeat lipid panel pending.  Continue Praluant 75 mg every 2 weeks. Continue clopidogrel 75 mg daily.

## 2022-08-31 NOTE — Progress Notes (Signed)
Subjective:    Patient ID: Nancy [name], female    DOB: 08-16-1955, 67 y.o.   MRN: [id number]  HPI  Nancy [name] is a very pleasant 67 y.o. female who presents today for complete physical and follow up of chronic conditions.  She would also like to discuss chronic sinus congestion. Chronic sinus congestion behind her nose for years. She feels that she can never blow anything out of her nose. She's tried Flonase and antihistamines without improvement. She was receiving allergy injections at one point. She has never undergone CT sinus.   Immunizations: -Tetanus: Completed in 2017 -Influenza: Did not complete this season -Shingles: Declines -Pneumonia: Completed Prevnar 13 in 2022 and Pneumovax 23 in 2023  Diet: [place].  Exercise: No regular exercise.  Eye exam: Completes annually  Dental exam: Completes semi-annually    Mammogram: Completed in November 2023 Bone Density Scan: Completed in May of 2022  Colonoscopy: Completed in April 2022, due April 2023 and did not complete   BP Readings from Last 3 Encounters:  08/31/22 132/84  07/15/22 118/70  07/09/22 112/68      Review of Systems  Constitutional:  Negative for unexpected weight change.  HENT:  Positive for congestion and sinus pressure. Negative for rhinorrhea.   Respiratory:  Negative for cough and shortness of breath.   Cardiovascular:  Negative for chest pain.  Gastrointestinal:  Negative for constipation and diarrhea.  Genitourinary:  Negative for difficulty urinating.  Musculoskeletal:  Positive for arthralgias.  Skin:  Negative for rash.  Allergic/Immunologic: Positive for environmental allergies.  Neurological:  Negative for dizziness, numbness and headaches.  Psychiatric/Behavioral:  The patient is not nervous/anxious.          Past Medical History:  Diagnosis Date   Allergy to alpha-gal    Breast cancer ([place]) 10/23/2020   CAD (coronary artery disease)    Chronic neck pain 04/11/2018    Coronary artery calcification seen on CAT scan    Eczema    Essential hypertension 07/20/2019   Fatigue    GAD (generalized anxiety disorder) 06/26/2018   Hyperlipidemia    Lichen sclerosus    Overweight (BMI 25.0-29.9) 01/09/2018   Recurrent upper respiratory infection (URI)    Routine general medical examination at a health care facility    Urinary frequency 06/06/2019   Urticaria    Vaginal itching 06/06/2019    Social History   Socioeconomic History   Marital status: Single    Spouse name: Not on file   Number of children: 2   Years of education: Not on file   Highest education level: Not on file  Occupational History   Not on file  Tobacco Use   Smoking status: Never   Smokeless tobacco: Never  Vaping Use   Vaping Use: Never used  Substance and Sexual Activity   Alcohol use: Not Currently    Alcohol/week: 0.0 standard drinks of alcohol   Drug use: No   Sexual activity: Not on file  Other Topics Concern   Not on file  Social History Narrative   Divorced.  G3P2.   Regular exercise-yes   Social Determinants of Health   Financial Resource Strain: Low Risk  (01/06/2022)   Overall Financial Resource Strain (CARDIA)    Difficulty of Paying Living Expenses: Not hard at all  Food Insecurity: No Food Insecurity (06/30/2022)   Hunger Vital Sign    Worried About Running Out of Food in the Last Year: Never true    Ran Out  of Food in the Last Year: Never true  Transportation Needs: No Transportation Needs (06/30/2022)   PRAPARE - Hydrologist (Medical): No    Lack of Transportation (Non-Medical): No  Physical Activity: Insufficiently Active (01/06/2022)   Exercise Vital Sign    Days of Exercise per Week: 3 days    Minutes of Exercise per Session: 30 min  Stress: No Stress Concern Present (01/06/2022)   [place]    Feeling of Stress : Not at all  Social Connections: Moderately  Isolated (01/06/2022)   Social Connection and Isolation Panel [NHANES]    Frequency of Communication with Friends and Family: More than three times a week    Frequency of Social Gatherings with Friends and Family: Once a week    Attends Religious Services: More than 4 times per year    Active Member of [employer] or Organizations: No    Attends Archivist Meetings: Never    Marital Status: Divorced  Human resources officer Violence: Not At Risk (06/30/2022)   Humiliation, Afraid, Rape, and Kick questionnaire    Fear of Current or Ex-Partner: No    Emotionally Abused: No    Physically Abused: No    Sexually Abused: No    Past Surgical History:  Procedure Laterality Date   ANTERIOR CERVICAL DECOMP/DISCECTOMY FUSION     BREAST LUMPECTOMY WITH RADIOACTIVE SEED LOCALIZATION Left 11/14/2020   Procedure: LEFT BREAST LUMPECTOMY WITH RADIOACTIVE SEED LOCALIZATION;  Surgeon: Erroll Luna, MD;  Location: [place];  Service: General;  Laterality: Left;   [place] and 1983   COLONOSCOPY WITH PROPOFOL N/A 10/06/2020   Procedure: COLONOSCOPY WITH PROPOFOL;  Surgeon: Virgel Manifold, MD;  Location: [facility];  Service: Endoscopy;  Laterality: N/A;   CORONARY STENT INTERVENTION N/A 06/30/2022   Procedure: CORONARY STENT INTERVENTION;  Surgeon: Sherren Mocha, MD;  Location: [place] [facility];  Service: Cardiovascular;  Laterality: N/A;   LEFT HEART CATH AND CORONARY ANGIOGRAPHY N/A 06/30/2022   Procedure: LEFT HEART CATH AND CORONARY ANGIOGRAPHY;  Surgeon: Sherren Mocha, MD;  Location: [place] [facility];  Service: Cardiovascular;  Laterality: N/A;   PARTIAL HYSTERECTOMY     TUBAL LIGATION  1985    Family History  Problem Relation Age of Onset   Hydrocephalus Mother    Neuropathy Mother    Diabetes Father    Hypertension Father    Thyroid cancer Cousin    Thyroid disease Maternal Aunt     Allergies  Allergen Reactions   Alpha-Gal Anaphylaxis,  Hives, Itching and Rash   Macrobid [Nitrofurantoin] Other (See Comments)    Hypoglycemia   Atorvastatin     Myalgias on 20mg  daily dosing   Lisinopril     Not allergy, intolerance: headaches   Milk Protein    Nitroglycerin     Not allergic, but needs premedication to use this as it contains bovine components (alpha gal allergy)   Other     Red Meats, Milk, Shellfish   Praluent [Alirocumab]     Flu-like symptoms   Pravastatin     10-20mg  - myalgias   Repatha [Evolocumab]     Back aches, headache, sore throat   Rosuvastatin     Myalgias on 10mg  daily and 5mg  2x/week dosing   Shellfish Allergy    Simvastatin     Myalgias on 20mg  daily dosing   Statins Other (See Comments)  Myalgias    Zetia [Ezetimibe] Other (See Comments)    Myalgias    Current Outpatient Medications on File Prior to Visit  Medication Sig Dispense Refill   Alirocumab (PRALUENT) 75 MG/ML SOAJ Inject 1 Pen into the skin every 14 (fourteen) days. 6 mL 3   aspirin EC 81 MG tablet Take 1 tablet (81 mg total) by mouth daily.     Cholecalciferol (VITAMIN D-3 PO) Take 1 tablet by mouth 3 (three) times a week.     citalopram (CELEXA) 20 MG tablet TAKE 1 TABLET (20 MG TOTAL) BY MOUTH DAILY. FOR ANXIETY. 90 tablet 0   clobetasol cream (TEMOVATE) [id number] % Apply 1 application  topically daily.     clopidogrel (PLAVIX) 75 MG tablet Take 1 tablet (75 mg total) by mouth daily with breakfast. 90 tablet 2   Cyanocobalamin (B-12) 1000 MCG CAPS Take 1 tablet by mouth 3 (three) times a week.     EPINEPHrine 0.3 mg/0.3 mL IJ SOAJ injection Inject 0.3 mg into the muscle as needed for anaphylaxis. 1 each 0   nitroGLYCERIN (NITROLINGUAL) 0.4 MG/SPRAY spray Place 1 spray under the tongue every 5 (five) minutes as needed for chest pain. 12 g 2   diphenhydrAMINE (BENADRYL) 25 MG tablet Take 1 tablet (25 mg total) by mouth every 6 (six) hours as needed (rash, itching). (Patient not taking: Reported on 08/31/2022) 15 tablet 0   metoprolol  tartrate (LOPRESSOR) 25 MG tablet Take 1 tablet (25 mg total) by mouth 2 (two) times daily. (Patient not taking: Reported on 08/31/2022) 180 tablet 3   No current facility-administered medications on file prior to visit.    BP 132/84   Pulse 67   Temp (!) 97.2 F (36.2 C) (Temporal)   Ht 5\' 3"  (1.6 m)   Wt 152 lb (68.9 kg)   SpO2 100%   BMI 26.93 kg/m  Objective:   Physical Exam HENT:     Right Ear: Tympanic membrane and ear canal normal.     Left Ear: Tympanic membrane and ear canal normal.     Nose: Nose normal.  Eyes:     Conjunctiva/sclera: Conjunctivae normal.     Pupils: Pupils are equal, round, and reactive to light.  Neck:     Thyroid: No thyromegaly.  Cardiovascular:     Rate and Rhythm: Normal rate and regular rhythm.     Heart sounds: No murmur heard. Pulmonary:     Effort: Pulmonary effort is normal.     Breath sounds: Normal breath sounds. No rales.  Abdominal:     General: Bowel sounds are normal.     Palpations: Abdomen is soft.     Tenderness: There is no abdominal tenderness.  Musculoskeletal:        General: Normal range of motion.     Cervical back: Neck supple.  Lymphadenopathy:     Cervical: No cervical adenopathy.  Skin:    General: Skin is warm and dry.     Findings: No rash.  Neurological:     Mental Status: She is alert and oriented to person, place, and time.     Cranial Nerves: No cranial nerve deficit.     Deep Tendon Reflexes: Reflexes are normal and symmetric.  Psychiatric:        Mood and Affect: Mood normal.           Assessment & Plan:  Encounter for annual general medical examination with abnormal findings in adult Assessment & Plan: Immunizations UTD. Discussed Shingrix  vaccine Mammogram due in May. Bone density due, orders placed. Colonoscopy due, referral placed to GI.  Discussed the importance of a healthy diet and regular exercise in order for weight loss, and to reduce the risk of further co-morbidity.  Exam  stable. Labs pending and also reviewed from January 2024.  Follow up in 1 year for repeat physical.    Coronary artery disease involving native coronary artery of native heart without angina pectoris Assessment & Plan: Post PCI with DES to mid LAD in January 2024. Reviewed cardiac catheterization notes from January 2024. Reviewed cardiology office notes from February 2024.  Asymptomatic.      Screening for colon cancer -     Ambulatory referral to Gastroenterology  Estrogen deficiency -     DG Bone Density; Future  Essential hypertension Assessment & Plan: Overall controlled.  She discontinued metoprolol on her own a few days ago.   Discussed to resume metoprolol at 12.5 mg if BP continued to rise.  She will continue to monitor BP.    Chronic congestion of paranasal sinus Assessment & Plan: CT maxillary/sinus ordered and pending.  Discussed to resume antihistamine. Consider different nasal spray vs returning to allergist for injections.   Orders: -     CT MAXILLOFACIAL W CONTRAST; Future  Allergy to alpha-gal Assessment & Plan: Remains off of mammillary food.   Repeat levels pending.  Orders: -     Alpha-Gal Panel  Mixed hyperlipidemia Assessment & Plan: Repeat lipid panel pending.  Continue Praluant 75 mg every 2 weeks. Continue clopidogrel 75 mg daily.  Orders: -     Lipid panel -     Hepatic function panel  Allergic rhinitis, unspecified seasonality, unspecified trigger Assessment & Plan: Chronic with constant nasal congestion.  Failed multiple OTC products. Will order CT sinus scan for further evaluation.    Ductal carcinoma in situ (DCIS) of left breast Assessment & Plan: Repeat mammogram pending for May 2024. No longer following with surgery.   GAD (generalized anxiety disorder) Assessment & Plan: Controlled.  Continue citalopram 20 mg daily.   Lichen sclerosus Assessment & Plan: Controlled.  Continue clobetasol 0.05% cream  PRN.   Prediabetes Assessment & Plan: Repeat A1C pending.  Discussed the importance of a healthy diet and regular exercise in order for weight loss, and to reduce the risk of further co-morbidity.   Orders: -     Hemoglobin A1c        Pleas Koch, NP

## 2022-08-31 NOTE — Assessment & Plan Note (Signed)
Overall controlled.  She discontinued metoprolol on her own a few days ago.   Discussed to resume metoprolol at 12.5 mg if BP continued to rise.  She will continue to monitor BP.

## 2022-08-31 NOTE — Assessment & Plan Note (Signed)
CT maxillary/sinus ordered and pending.  Discussed to resume antihistamine. Consider different nasal spray vs returning to allergist for injections.

## 2022-08-31 NOTE — Assessment & Plan Note (Signed)
Post PCI with DES to mid LAD in January 2024. Reviewed cardiac catheterization notes from January 2024. Reviewed cardiology office notes from February 2024.  Asymptomatic.

## 2022-08-31 NOTE — Assessment & Plan Note (Signed)
Immunizations UTD. Discussed Shingrix vaccine Mammogram due in May. Bone density due, orders placed. Colonoscopy due, referral placed to GI.  Discussed the importance of a healthy diet and regular exercise in order for weight loss, and to reduce the risk of further co-morbidity.  Exam stable. Labs pending and also reviewed from January 2024.  Follow up in 1 year for repeat physical.

## 2022-08-31 NOTE — Telephone Encounter (Signed)
Patient called in to let us know that she had hgb A1c, lipid panel, alpha-gal and hepatic function panel done at her PCP office today. These results are already showing as pending in Epic. I advised that Dr. Acie Fredrickson would be able to see these results when they care completed.

## 2022-08-31 NOTE — Assessment & Plan Note (Signed)
Repeat mammogram pending for May 2024. No longer following with surgery.

## 2022-08-31 NOTE — Assessment & Plan Note (Signed)
Repeat A1C pending.   Discussed the importance of a healthy diet and regular exercise in order for weight loss, and to reduce the risk of further co-morbidity.  

## 2022-08-31 NOTE — Assessment & Plan Note (Signed)
Remains off of mammillary food.   Repeat levels pending.

## 2022-08-31 NOTE — Assessment & Plan Note (Signed)
Chronic with constant nasal congestion.  Failed multiple OTC products. Will order CT sinus scan for further evaluation.

## 2022-08-31 NOTE — Assessment & Plan Note (Signed)
Controlled. ? ?Continue citalopram 20 mg daily. ?

## 2022-08-31 NOTE — Assessment & Plan Note (Signed)
Controlled.  Continue clobetasol 0.05% cream PRN.

## 2022-08-31 NOTE — Patient Instructions (Signed)
Stop by the lab prior to leaving today. I will notify you of your results once received.   You will either be contacted via phone regarding your CT scan and colonoscopy, or you may receive a letter on your MyChart portal from our referral team with instructions for scheduling an appointment. Please let us know if you have not been contacted by anyone within two weeks.  [facility] to schedule your mammogram and bone density scan.   It was a pleasure to see you today!

## 2022-08-31 NOTE — Telephone Encounter (Signed)
Patient called to report she had lab work done at her PCP's office.

## 2022-09-01 ENCOUNTER — Telehealth: Payer: Self-pay | Admitting: Pharmacist

## 2022-09-01 NOTE — Telephone Encounter (Addendum)
Received inbasket result from PCP NP with lipid results, LDL 102 down from 211. Have forwarded to [place] for further review/advisement - pt on [id number]. She spoke with patient about recommendations in updated phone note.

## 2022-09-01 NOTE — Telephone Encounter (Signed)
Called pt to discuss lipids results.  Baseline LDL 211, improved down to 102 (52% LDL decrease) since rechallenging with Praluent - previously experienced flu-like sx on this so I'm glad she's been tolerating it better now. Previous intolerances have made med management difficult as she's intolerant to atorvastatin 20mg  daily, rosuvastatin 10mg  daily and 5mg  2x per week, pravastatin 10-20mg  daily, simvastatin 20mg  daily, ezetimibe 10mg  daily, and Repatha. Her insurance doesn't cover Leqvio well.   Called pt to discuss potentially increasing her Praluent dose or adding on Nexletol to try to target LDL goal < 70 given her elevated calcium score. She would like to think about it. Sees Dr Acie Fredrickson next month and can let us know if she'd like to try either option.

## 2022-09-03 ENCOUNTER — Telehealth: Payer: Self-pay | Admitting: Gastroenterology

## 2022-09-03 LAB — INTERPRETATION:

## 2022-09-03 LAB — ALPHA-GAL PANEL
Allergen, Mutton, f88: 30.6 kU/L — ABNORMAL HIGH
Allergen, Pork, f26: 31.9 kU/L — ABNORMAL HIGH
Beef: 42.4 kU/L — ABNORMAL HIGH
CLASS: 4
CLASS: 4
Class: 4
GALACTOSE-ALPHA-1,3-GALACTOSE IGE*: >100 kU/L — ABNORMAL HIGH (ref <0.10)

## 2022-09-03 NOTE — Telephone Encounter (Signed)
Good morning Dr. Fuller Plan,   We received a referral for this patient for a colonoscopy. This patient was previously seen by you in 2012, then transferred her care to Dr. Ronelle Nigh with [facility]. Patient states she would like a Transfer of Care back to [place] due to accessibility and being referred here. Records from procedure are in Epic for your review. Please advise on scheduling.   Thank you.

## 2022-09-07 ENCOUNTER — Ambulatory Visit: Payer: PPO

## 2022-09-08 ENCOUNTER — Encounter: Payer: Self-pay | Admitting: Gastroenterology

## 2022-10-01 ENCOUNTER — Other Ambulatory Visit: Payer: PPO

## 2022-10-01 DIAGNOSIS — H524 Presbyopia: Secondary | ICD-10-CM | POA: Diagnosis not present

## 2022-10-04 ENCOUNTER — Other Ambulatory Visit (HOSPITAL_COMMUNITY): Payer: Self-pay

## 2022-10-14 ENCOUNTER — Telehealth: Payer: Self-pay | Admitting: Primary Care

## 2022-10-14 NOTE — Telephone Encounter (Signed)
Please schedule an appt with a provider to eval sxs. If appropriate they can order labs at that appt

## 2022-10-14 NOTE — Telephone Encounter (Signed)
Patient scheduled.

## 2022-10-14 NOTE — Telephone Encounter (Signed)
Patient called in and stated that she got bit by a tick on Saturday. She stated that she hasn't had a fever, but has had night sweats and flu like symptoms. She was wanting to know if she should come in for a lyme disease test. Please advise. Thank you!

## 2022-10-16 ENCOUNTER — Encounter: Payer: Self-pay | Admitting: Cardiovascular Disease

## 2022-10-16 NOTE — Progress Notes (Unsigned)
Cardiology Office Note:    Date:  10/19/2022   ID:  Nancy [name], DOB 01/23/1956, MRN [id number]  PCP:  Doreene Nest, NP  Cardiologist:  Patsey Pitstick  Electrophysiologist:  None   Referring MD: Doreene Nest, NP   Problem List 1. Anxiety:  2.  Hyperlipidemia  3.  Hypertension   Chief Complaint  Patient presents with   Hyperlipidemia    History of Present Illness:    Nancy [name] is a 67 y.o. female with a hx of anxiety ,   ? HTN. We were asked to see her by Vernona Rieger, NP for further evaluation of an episode of face tingling and HTN.  Mother of Nancy [name] ( has premature CAD )    Has had some tingling on the side of her face.   Became very anxious Called EMS,  BP was very elevated.  She tried amlopidine .  She had recurrent episodes of marked HTN and face tingling .  BP was 220   [name] changed the amlodipine to Lisinopril 10 mg BID  BP continued to spike  Was started on metoprolol   Admits that she does not watch her salt .  Eats fast foods frequently for lunch  Dinner  Typically fried or salty foods.    September 06, 2019:  Nancy [name] is seen for follow-up visit for hyperlipidemia and hypertension. We started her on chlorthalidone during her last visit.    Coronary calcium score of 243. This was 91st percentile for age and sex matched control.  We saw her on Crestor at that time.  Has been on a low salt diet.  Her primary MD cut her lisinopril and metoprolol   December 07, 2019:  Nancy [name] is seen today for follow-up of her hyperlipidemia and hypertension. Feeling well,  Doing great.  She is now on Repatha  BP and HR are very well controlled on metoprolol 12,5 BID Her anxiety is much better controlled   April 24, 2021: Nancy [name] is seen today for follow-up of her hyperlipidemia and hypertension. Still having some chest wall pain .    Has not been getting regular exercise  Only sporadic exercise   Has developed alpha gal -  found a wood tick on her   Would develop a rash anytime she ate beef.  tested  positive for alpha gal Also allergic to milk products    Oct 19, 2022 Nancy [name] is seen after a 3 year absence. Hx of coronary stenting in Jan. 2024   Her last LDL was 104 but she was off the pralulent     Hx of HLD  HTN, Alpha gal  BP went low one day and she lowered her metoprolol  Had another tick bite  Had 3 days of night sweats  Exercises regularly  They own cows,   She is likely to need cataract surgery at some point.  She is at low risk for her upcoming cataract surgery.  I would not like for her to hold aspirin or Plavix at least until January of next year because of her recent stent.   Past Medical History:  Diagnosis Date   Allergy to alpha-gal    Breast cancer (HCC) 10/23/2020   CAD (coronary artery disease)    Chronic neck pain 04/11/2018   Coronary artery calcification seen on CAT scan    Eczema    Essential hypertension 07/20/2019   Fatigue    GAD (generalized anxiety disorder) 06/26/2018   Hyperlipidemia  Lichen sclerosus    Overweight (BMI 25.0-29.9) 01/09/2018   Recurrent upper respiratory infection (URI)    Routine general medical examination at a health care facility    Urinary frequency 06/06/2019   Urticaria    Vaginal itching 06/06/2019    Past Surgical History:  Procedure Laterality Date   ANTERIOR CERVICAL DECOMP/DISCECTOMY FUSION     BREAST LUMPECTOMY WITH RADIOACTIVE SEED LOCALIZATION Left 11/14/2020   Procedure: LEFT BREAST LUMPECTOMY WITH RADIOACTIVE SEED LOCALIZATION;  Surgeon: Harriette Bouillon, MD;  Location: [facility];  Service: General;  Laterality: Left;   CESAREAN SECTION     1982 and 1983   COLONOSCOPY WITH PROPOFOL N/A 10/06/2020   Procedure: COLONOSCOPY WITH PROPOFOL;  Surgeon: Pasty Spillers, MD;  Location: [facility];  Service: Endoscopy;  Laterality: N/A;   CORONARY STENT INTERVENTION N/A 06/30/2022   Procedure: CORONARY STENT INTERVENTION;  Surgeon:  Tonny Bollman, MD;  Location: [facility] INVASIVE CV LAB;  Service: Cardiovascular;  Laterality: N/A;   LEFT HEART CATH AND CORONARY ANGIOGRAPHY N/A 06/30/2022   Procedure: LEFT HEART CATH AND CORONARY ANGIOGRAPHY;  Surgeon: Tonny Bollman, MD;  Location: [facility] INVASIVE CV LAB;  Service: Cardiovascular;  Laterality: N/A;   PARTIAL HYSTERECTOMY     TUBAL LIGATION  1985    Current Medications: Current Meds  Medication Sig   Alirocumab (PRALUENT) 75 MG/ML SOAJ Inject 1 Pen into the skin every 14 (fourteen) days.   aspirin EC 81 MG tablet Take 1 tablet (81 mg total) by mouth daily.   Cholecalciferol (VITAMIN D-3 PO) Take 1 tablet by mouth 3 (three) times a week.   citalopram (CELEXA) 20 MG tablet TAKE 1 TABLET (20 MG TOTAL) BY MOUTH DAILY. FOR ANXIETY.   clobetasol cream (TEMOVATE) 0.05 % Apply 1 application  topically daily.   clopidogrel (PLAVIX) 75 MG tablet Take 1 tablet (75 mg total) by mouth daily with breakfast.   Cyanocobalamin (B-12) 1000 MCG CAPS Take 1 tablet by mouth 3 (three) times a week.   diphenhydrAMINE (BENADRYL) 25 MG tablet Take 1 tablet (25 mg total) by mouth every 6 (six) hours as needed (rash, itching).   EPINEPHrine 0.3 mg/0.3 mL IJ SOAJ injection Inject 0.3 mg into the muscle as needed for anaphylaxis.   metoprolol tartrate (LOPRESSOR) 25 MG tablet Take 1 tablet (25 mg total) by mouth 2 (two) times daily.   nitroGLYCERIN (NITROLINGUAL) 0.4 MG/SPRAY spray Place 1 spray under the tongue every 5 (five) minutes as needed for chest pain.     Allergies:   Alpha-gal, Macrobid [nitrofurantoin], Atorvastatin, Lisinopril, Milk protein, Nitroglycerin, Other, Pravastatin, Repatha [evolocumab], Rosuvastatin, Shellfish allergy, Simvastatin, Statins, and Zetia [ezetimibe]   Social History   Socioeconomic History   Marital status: Single    Spouse name: Not on file   Number of children: 2   Years of education: Not on file   Highest education level: 12th grade  Occupational History   Not  on file  Tobacco Use   Smoking status: Never   Smokeless tobacco: Never  Vaping Use   Vaping Use: Never used  Substance and Sexual Activity   Alcohol use: Not Currently    Alcohol/week: 0.0 standard drinks of alcohol   Drug use: No   Sexual activity: Not on file  Other Topics Concern   Not on file  Social History Narrative   Divorced.  G3P2.   Regular exercise-yes   Social Determinants of Health   Financial Resource Strain: Low Risk  (10/18/2022)   Overall Financial Resource  Strain (CARDIA)    Difficulty of Paying Living Expenses: Not very hard  Food Insecurity: No Food Insecurity (10/18/2022)   Hunger Vital Sign    Worried About Running Out of Food in the Last Year: Never true    Ran Out of Food in the Last Year: Never true  Transportation Needs: No Transportation Needs (10/18/2022)   PRAPARE - Administrator, Civil Service (Medical): No    Lack of Transportation (Non-Medical): No  Physical Activity: Insufficiently Active (10/18/2022)   Exercise Vital Sign    Days of Exercise per Week: 4 days    Minutes of Exercise per Session: 30 min  Stress: No Stress Concern Present (10/18/2022)   [employer] of Occupational Health - Occupational Stress Questionnaire    Feeling of Stress : Only a little  Social Connections: Moderately Integrated (10/18/2022)   Social Connection and Isolation Panel [NHANES]    Frequency of Communication with Friends and Family: More than three times a week    Frequency of Social Gatherings with Friends and Family: Three times a week    Attends Religious Services: More than 4 times per year    Active Member of Clubs or Organizations: Yes    Attends Engineer, structural: More than 4 times per year    Marital Status: Divorced     Family History: The patient's family history includes Diabetes in her father; Hydrocephalus in her mother; Hypertension in her father; Neuropathy in her mother; Thyroid cancer in her cousin; Thyroid disease in  her maternal aunt.  ROS:   Please see the history of present illness.     All other systems reviewed and are negative.  EKGs/Labs/Other Studies Reviewed:    The following studies were reviewed today:      Recent Labs: 06/21/2022: Magnesium 2.3; TSH 1.010 07/01/2022: BUN 10; Creatinine, Ser 0.71; Hemoglobin 12.8; Platelets 228; Potassium 3.9; Sodium 136 08/31/2022: ALT 14  Recent Lipid Panel    Component Value Date/Time   CHOL 196 08/31/2022 1042   CHOL 303 (H) 06/21/2022 1236   TRIG 97.0 08/31/2022 1042   HDL 74.40 08/31/2022 1042   HDL 69 06/21/2022 1236   CHOLHDL 3 08/31/2022 1042   VLDL 19.4 08/31/2022 1042   LDLCALC 102 (H) 08/31/2022 1042   LDLCALC 211 (H) 06/21/2022 1236   LDLDIRECT 197.5 11/02/2011 0819    Physical Exam:    Physical Exam: Blood pressure 124/84, pulse 61, height 5\' 3"  (1.6 m), weight 145 lb 9.6 oz (66 kg), SpO2 98 %.       GEN:  Well nourished, well developed in no acute distress HEENT: Normal NECK: No JVD; No carotid bruits LYMPHATICS: No lymphadenopathy CARDIAC: RRR , no murmurs, rubs, gallops RESPIRATORY:  Clear to auscultation without rales, wheezing or rhonchi  ABDOMEN: Soft, non-tender, non-distended MUSCULOSKELETAL:  No edema; No deformity  SKIN: Warm and dry NEUROLOGIC:  Alert and oriented x 3   ECG:    ASSESSMENT:    1. CAD in native artery   2. Mixed hyperlipidemia      PLAN:     1.   CAD :   s/p stenting . No angina   1.   Hyperlipidemia:     She was on Praluent.  Her last LDL was 104 but she had stopped her Praluent for several weeks.  She is back on the Praluent now.  Will recheck labs  2.  Hypertension:    Blood pressure is fairly well-controlled.  Continue current medications.  3.  Anxiety:     Medication Adjustments/Labs and Tests Ordered: Current medicines are reviewed at length with the patient today.  Concerns regarding medicines are outlined above.  Orders Placed This Encounter  Procedures   Lipid  panel   ALT    No orders of the defined types were placed in this encounter.     Patient Instructions  Medication Instructions:  Your physician recommends that you continue on your current medications as directed. Please refer to the Current Medication list given to you today.  *If you need a refill on your cardiac medications before your next appointment, please call your pharmacy*   Lab Work: Lipids, ALT in 3 months If you have labs (blood work) drawn today and your tests are completely normal, you will receive your results only by: MyChart Message (if you have MyChart) OR A paper copy in the mail If you have any lab test that is abnormal or we need to change your treatment, we will call you to review the results.   Testing/Procedures: NONE   Follow-Up: At [facility], you and your health needs are our priority.  As part of our continuing mission to provide you with exceptional heart care, we have created designated Provider Care Teams.  These Care Teams include your primary Cardiologist (physician) and Advanced Practice Providers (APPs -  Physician Assistants and Nurse Practitioners) who all work together to provide you with the care you need, when you need it.  We recommend signing up for the patient portal called "MyChart".  Sign up information is provided on this After Visit Summary.  MyChart is used to connect with patients for Virtual Visits (Telemedicine).  Patients are able to view lab/test results, encounter notes, upcoming appointments, etc.  Non-urgent messages can be sent to your provider as well.   To learn more about what you can do with MyChart, go to [email or web address].    Your next appointment:   6 month(s)  Provider:   Kristeen Miss, MD        Signed, Kristeen Miss, MD  10/19/2022 5:57 PM    [facility]

## 2022-10-19 ENCOUNTER — Encounter: Payer: Self-pay | Admitting: Cardiovascular Disease

## 2022-10-19 ENCOUNTER — Encounter: Payer: Self-pay | Admitting: Primary Care

## 2022-10-19 ENCOUNTER — Ambulatory Visit: Payer: PPO | Attending: Cardiovascular Disease | Admitting: Cardiovascular Disease

## 2022-10-19 ENCOUNTER — Ambulatory Visit (INDEPENDENT_AMBULATORY_CARE_PROVIDER_SITE_OTHER): Payer: PPO | Admitting: Primary Care

## 2022-10-19 VITALS — BP 124/84 | HR 61 | Ht 63.0 in | Wt 145.6 lb

## 2022-10-19 VITALS — BP 130/72 | HR 67 | Temp 98.1°F | Ht 63.0 in | Wt 148.0 lb

## 2022-10-19 DIAGNOSIS — E782 Mixed hyperlipidemia: Secondary | ICD-10-CM

## 2022-10-19 DIAGNOSIS — S40262A Insect bite (nonvenomous) of left shoulder, initial encounter: Secondary | ICD-10-CM | POA: Diagnosis not present

## 2022-10-19 DIAGNOSIS — W57XXXA Bitten or stung by nonvenomous insect and other nonvenomous arthropods, initial encounter: Secondary | ICD-10-CM

## 2022-10-19 DIAGNOSIS — Z91018 Allergy to other foods: Secondary | ICD-10-CM | POA: Diagnosis not present

## 2022-10-19 DIAGNOSIS — I2510 Atherosclerotic heart disease of native coronary artery without angina pectoris: Secondary | ICD-10-CM | POA: Diagnosis not present

## 2022-10-19 NOTE — Patient Instructions (Signed)
Medication Instructions:  Your physician recommends that you continue on your current medications as directed. Please refer to the Current Medication list given to you today.  *If you need a refill on your cardiac medications before your next appointment, please call your pharmacy*   Lab Work: Lipids, ALT in 3 months If you have labs (blood work) drawn today and your tests are completely normal, you will receive your results only by: MyChart Message (if you have MyChart) OR A paper copy in the mail If you have any lab test that is abnormal or we need to change your treatment, we will call you to review the results.   Testing/Procedures: NONE   Follow-Up: At [facility], you and your health needs are our priority.  As part of our continuing mission to provide you with exceptional heart care, we have created designated Provider Care Teams.  These Care Teams include your primary Cardiologist (physician) and Advanced Practice Providers (APPs -  Physician Assistants and Nurse Practitioners) who all work together to provide you with the care you need, when you need it.  We recommend signing up for the patient portal called "MyChart".  Sign up information is provided on this After Visit Summary.  MyChart is used to connect with patients for Virtual Visits (Telemedicine).  Patients are able to view lab/test results, encounter notes, upcoming appointments, etc.  Non-urgent messages can be sent to your provider as well.   To learn more about what you can do with MyChart, go to [email or web address].    Your next appointment:   6 month(s)  Provider:   Kristeen Miss, MD

## 2022-10-19 NOTE — Assessment & Plan Note (Signed)
Post acupuncture therapy. Repeat levels pending per patient request.

## 2022-10-19 NOTE — Assessment & Plan Note (Signed)
Symptoms resolved.  No evidence of Lyme disease, discussed with patient.  She will update if symptoms return.

## 2022-10-19 NOTE — Patient Instructions (Signed)
Stop by the lab prior to leaving today. I will notify you of your results once received.   Be careful when introducing beef and pork back into your diet.   It was a pleasure to see you today!

## 2022-10-19 NOTE — Progress Notes (Signed)
Subjective:    Patient ID: Nancy [name], female    DOB: 1956/02/20, 67 y.o.   MRN: [id number]  HPI  Nancy [name] is a very pleasant 67 y.o. female with a history of alpha gal allergy, hypertension, CAD, prediabetes, DCIS who presents today to discuss tick bite.  She underwent acupuncture by a [place] physician to the right ear a few weeks ago in an attempt to treat her alpha gal.  On 10/09/22 she began noticing some itching to her left posterior shoulder. She scratched and noticed a tick to the site so she pulled off the tick. Several days later she developed fatigue and night sweats which lasted for about 2-3 days. She's been symptom free since. The tick was very small and easy to remove.   Last week she tried eating beef and ham several times, had no reaction. Her last dose of beef was two days ago, about 5 oz of steak and did well. She has noticed a lighter/clay colored stools over the last two days for which she suspects is secondary to reintroducing beef and pork.   Review of Systems  Constitutional:  Negative for chills and fever.  Musculoskeletal:  Negative for arthralgias.  Skin:  Negative for color change and rash.         Past Medical History:  Diagnosis Date   Allergy to alpha-gal    Breast cancer (HCC) 10/23/2020   CAD (coronary artery disease)    Chronic neck pain 04/11/2018   Coronary artery calcification seen on CAT scan    Eczema    Essential hypertension 07/20/2019   Fatigue    GAD (generalized anxiety disorder) 06/26/2018   Hyperlipidemia    Lichen sclerosus    Overweight (BMI 25.0-29.9) 01/09/2018   Recurrent upper respiratory infection (URI)    Routine general medical examination at a health care facility    Urinary frequency 06/06/2019   Urticaria    Vaginal itching 06/06/2019    Social History   Socioeconomic History   Marital status: Single    Spouse name: Not on file   Number of children: 2   Years of education: Not on file   Highest  education level: 12th grade  Occupational History   Not on file  Tobacco Use   Smoking status: Never   Smokeless tobacco: Never  Vaping Use   Vaping Use: Never used  Substance and Sexual Activity   Alcohol use: Not Currently    Alcohol/week: 0.0 standard drinks of alcohol   Drug use: No   Sexual activity: Not on file  Other Topics Concern   Not on file  Social History Narrative   Divorced.  G3P2.   Regular exercise-yes   Social Determinants of Health   Financial Resource Strain: Low Risk  (10/18/2022)   Overall Financial Resource Strain (CARDIA)    Difficulty of Paying Living Expenses: Not very hard  Food Insecurity: No Food Insecurity (10/18/2022)   Hunger Vital Sign    Worried About Running Out of Food in the Last Year: Never true    Ran Out of Food in the Last Year: Never true  Transportation Needs: No Transportation Needs (10/18/2022)   PRAPARE - Administrator, Civil Service (Medical): No    Lack of Transportation (Non-Medical): No  Physical Activity: Insufficiently Active (10/18/2022)   Exercise Vital Sign    Days of Exercise per Week: 4 days    Minutes of Exercise per Session: 30 min  Stress: No Stress Concern  Present (10/18/2022)   [employer] of Occupational Health - Occupational Stress Questionnaire    Feeling of Stress : Only a little  Social Connections: Moderately Integrated (10/18/2022)   Social Connection and Isolation Panel [NHANES]    Frequency of Communication with Friends and Family: More than three times a week    Frequency of Social Gatherings with Friends and Family: Three times a week    Attends Religious Services: More than 4 times per year    Active Member of Clubs or Organizations: Yes    Attends Banker Meetings: More than 4 times per year    Marital Status: Divorced  Intimate Partner Violence: Not At Risk (06/30/2022)   Humiliation, Afraid, Rape, and Kick questionnaire    Fear of Current or Ex-Partner: No    Emotionally  Abused: No    Physically Abused: No    Sexually Abused: No    Past Surgical History:  Procedure Laterality Date   ANTERIOR CERVICAL DECOMP/DISCECTOMY FUSION     BREAST LUMPECTOMY WITH RADIOACTIVE SEED LOCALIZATION Left 11/14/2020   Procedure: LEFT BREAST LUMPECTOMY WITH RADIOACTIVE SEED LOCALIZATION;  Surgeon: Harriette Bouillon, MD;  Location: [facility];  Service: General;  Laterality: Left;   CESAREAN SECTION     1982 and 1983   COLONOSCOPY WITH PROPOFOL N/A 10/06/2020   Procedure: COLONOSCOPY WITH PROPOFOL;  Surgeon: Pasty Spillers, MD;  Location: [facility];  Service: Endoscopy;  Laterality: N/A;   CORONARY STENT INTERVENTION N/A 06/30/2022   Procedure: CORONARY STENT INTERVENTION;  Surgeon: Tonny Bollman, MD;  Location: [facility]VASIVE CV LAB;  Service: Cardiovascular;  Laterality: N/A;   LEFT HEART CATH AND CORONARY ANGIOGRAPHY N/A 06/30/2022   Procedure: LEFT HEART CATH AND CORONARY ANGIOGRAPHY;  Surgeon: Tonny Bollman, MD;  Location: [facility] INVASIVE CV LAB;  Service: Cardiovascular;  Laterality: N/A;   PARTIAL HYSTERECTOMY     TUBAL LIGATION  1985    Family History  Problem Relation Age of Onset   Hydrocephalus Mother    Neuropathy Mother    Diabetes Father    Hypertension Father    Thyroid cancer Cousin    Thyroid disease Maternal Aunt     Allergies  Allergen Reactions   Alpha-Gal Anaphylaxis, Hives, Itching and Rash   Macrobid [Nitrofurantoin] Other (See Comments)    Hypoglycemia   Atorvastatin     Myalgias on 20mg  daily dosing   Lisinopril     Not allergy, intolerance: headaches   Milk Protein    Nitroglycerin     Not allergic, but needs premedication to use this as it contains bovine components (alpha gal allergy)   Other     Red Meats, Milk, Shellfish   Pravastatin     10-20mg  - myalgias   Repatha [Evolocumab]     Back aches, headache, sore throat   Rosuvastatin     Myalgias on 10mg  daily and 5mg  2x/week dosing   Shellfish Allergy     Simvastatin     Myalgias on 20mg  daily dosing   Statins Other (See Comments)    Myalgias    Zetia [Ezetimibe] Other (See Comments)    Myalgias    Current Outpatient Medications on File Prior to Visit  Medication Sig Dispense Refill   Alirocumab (PRALUENT) 75 MG/ML SOAJ Inject 1 Pen into the skin every 14 (fourteen) days. 6 mL 3   aspirin EC 81 MG tablet Take 1 tablet (81 mg total) by mouth daily.     Cholecalciferol (VITAMIN D-3 PO) Take 1 tablet  by mouth 3 (three) times a week.     citalopram (CELEXA) 20 MG tablet TAKE 1 TABLET (20 MG TOTAL) BY MOUTH DAILY. FOR ANXIETY. 90 tablet 0   clobetasol cream (TEMOVATE) 0.05 % Apply 1 application  topically daily.     clopidogrel (PLAVIX) 75 MG tablet Take 1 tablet (75 mg total) by mouth daily with breakfast. 90 tablet 2   Cyanocobalamin (B-12) 1000 MCG CAPS Take 1 tablet by mouth 3 (three) times a week.     diphenhydrAMINE (BENADRYL) 25 MG tablet Take 1 tablet (25 mg total) by mouth every 6 (six) hours as needed (rash, itching). 15 tablet 0   EPINEPHrine 0.3 mg/0.3 mL IJ SOAJ injection Inject 0.3 mg into the muscle as needed for anaphylaxis. 1 each 0   metoprolol tartrate (LOPRESSOR) 25 MG tablet Take 1 tablet (25 mg total) by mouth 2 (two) times daily. 180 tablet 3   nitroGLYCERIN (NITROLINGUAL) 0.4 MG/SPRAY spray Place 1 spray under the tongue every 5 (five) minutes as needed for chest pain. 12 g 2   No current facility-administered medications on file prior to visit.    BP 130/72   Pulse 67   Temp 98.1 F (36.7 C) (Temporal)   Ht 5\' 3"  (1.6 m)   Wt 148 lb (67.1 kg)   SpO2 98%   BMI 26.22 kg/m  Objective:   Physical Exam Constitutional:      General: She is not in acute distress. Cardiovascular:     Rate and Rhythm: Normal rate. Rhythm irregular.  Pulmonary:     Effort: Pulmonary effort is normal.     Breath sounds: Normal breath sounds.  Skin:    General: Skin is warm and dry.     Findings: No erythema or rash.            Assessment & Plan:  Allergy to alpha-gal Assessment & Plan: Post acupuncture therapy. Repeat levels pending per patient request.   Orders: -     Alpha-Gal Panel  Tick bite of left shoulder, initial encounter Assessment & Plan: Symptoms resolved.  No evidence of Lyme disease, discussed with patient.  She will update if symptoms return.          Doreene Nest, NP

## 2022-10-22 DIAGNOSIS — L821 Other seborrheic keratosis: Secondary | ICD-10-CM | POA: Diagnosis not present

## 2022-10-22 DIAGNOSIS — S20469A Insect bite (nonvenomous) of unspecified back wall of thorax, initial encounter: Secondary | ICD-10-CM | POA: Diagnosis not present

## 2022-10-22 DIAGNOSIS — D225 Melanocytic nevi of trunk: Secondary | ICD-10-CM | POA: Diagnosis not present

## 2022-10-22 DIAGNOSIS — L814 Other melanin hyperpigmentation: Secondary | ICD-10-CM | POA: Diagnosis not present

## 2022-10-27 ENCOUNTER — Other Ambulatory Visit: Payer: PPO

## 2022-10-29 ENCOUNTER — Ambulatory Visit
Admission: RE | Admit: 2022-10-29 | Discharge: 2022-10-29 | Disposition: A | Discharge status: Home / Self Care | Payer: PPO | Source: Ambulatory Visit | Attending: Obstetrics & Gynecology | Admitting: Obstetrics & Gynecology

## 2022-10-29 DIAGNOSIS — R92332 Mammographic heterogeneous density, left breast: Secondary | ICD-10-CM | POA: Diagnosis not present

## 2022-10-29 DIAGNOSIS — R921 Mammographic calcification found on diagnostic imaging of breast: Secondary | ICD-10-CM

## 2022-10-29 DIAGNOSIS — Z853 Personal history of malignant neoplasm of breast: Secondary | ICD-10-CM | POA: Diagnosis not present

## 2022-10-29 LAB — ALPHA-GAL PANEL
Allergen, Mutton, f88: 45.5 kU/L — ABNORMAL HIGH
Allergen, Pork, f26: 38.1 kU/L — ABNORMAL HIGH
Beef: 45.9 kU/L — ABNORMAL HIGH
CLASS: 4
CLASS: 4
Class: 4
GALACTOSE-ALPHA-1,3-GALACTOSE IGE*: 68.8 kU/L — ABNORMAL HIGH (ref <0.10)

## 2022-10-29 LAB — INTERPRETATION:

## 2022-11-15 ENCOUNTER — Other Ambulatory Visit: Payer: Self-pay | Admitting: Primary Care

## 2022-11-15 DIAGNOSIS — F411 Generalized anxiety disorder: Secondary | ICD-10-CM

## 2022-12-20 ENCOUNTER — Telehealth: Payer: Self-pay | Admitting: Gastroenterology

## 2022-12-20 NOTE — Telephone Encounter (Signed)
Spoke with pt and she is aware of recommendations per Dr. Russella Dar. Appt cancelled and pt will reschedule when she is able to come off of the plavix.

## 2022-12-20 NOTE — Telephone Encounter (Signed)
Pt is scheduled to see Dr. Russella Dar 01/24/23 to discuss recall colon and pt is on plavix. She states she had a stent placed in Jan and her cardiologist stated she cannot come off of plavix for at least a year. Pt wants to know if she needs to keep the appt or wait until she may be able to come off of the plavix. Please advise.

## 2022-12-20 NOTE — Telephone Encounter (Signed)
Cancel August appt and reschedule when she can safely come off Plavix

## 2022-12-20 NOTE — Telephone Encounter (Signed)
Inbound call from patient with a few questions about being on plavix. Please advise.

## 2022-12-22 ENCOUNTER — Ambulatory Visit: Payer: PPO | Admitting: Gastroenterology

## 2023-01-04 DIAGNOSIS — H25013 Cortical age-related cataract, bilateral: Secondary | ICD-10-CM | POA: Diagnosis not present

## 2023-01-04 DIAGNOSIS — H2513 Age-related nuclear cataract, bilateral: Secondary | ICD-10-CM | POA: Diagnosis not present

## 2023-01-04 DIAGNOSIS — H2512 Age-related nuclear cataract, left eye: Secondary | ICD-10-CM | POA: Diagnosis not present

## 2023-01-04 DIAGNOSIS — H25043 Posterior subcapsular polar age-related cataract, bilateral: Secondary | ICD-10-CM | POA: Diagnosis not present

## 2023-01-04 DIAGNOSIS — H18413 Arcus senilis, bilateral: Secondary | ICD-10-CM | POA: Diagnosis not present

## 2023-01-05 ENCOUNTER — Ambulatory Visit (INDEPENDENT_AMBULATORY_CARE_PROVIDER_SITE_OTHER): Payer: PPO

## 2023-01-05 VITALS — Ht 62.0 in | Wt 150.0 lb

## 2023-01-05 DIAGNOSIS — Z0000 Encounter for general adult medical examination without abnormal findings: Secondary | ICD-10-CM | POA: Diagnosis not present

## 2023-01-05 NOTE — Progress Notes (Addendum)
Subjective:   Nancy [name] is a 67 y.o. female who presents for Medicare Annual (Subsequent) preventive examination.  Visit Complete: Virtual  I connected with  Nancy [name] on 01/05/23 by a audio enabled telemedicine application and verified that I am speaking with the correct person using two identifiers.  Patient Location: Other:  car  Provider Location: Home Office  I discussed the limitations of evaluation and management by telemedicine. The patient expressed understanding and agreed to proceed.  Patient Medicare AWV questionnaire was completed by the patient on 01/03/23; I have confirmed that all information answered by patient is correct and no changes since this date.  Review of Systems      Cardiac Risk Factors include: dyslipidemia;hypertension;advanced age (>59men, >7 women);sedentary lifestyle  Per patient no change in vitals since last visit; unable to obtain new vitals due to this being a telehealth visit.   Patient was unable to self-report vital signs via telehealth due to a lack of equipment at home.     Objective:    Today's Vitals   01/05/23 1109  Weight: 150 lb (68 kg)  Height: 5\' 2"  (1.575 m)   Body mass index is 27.44 kg/m.     01/05/2023   11:16 AM 06/30/2022   11:19 AM 01/06/2022    8:31 AM 11/14/2020    6:54 AM 11/11/2020   10:41 AM 11/06/2020    4:01 PM 11/06/2020    2:41 PM  Advanced Directives  Does Patient Have a Medical Advance Directive? Yes Yes No Yes Yes Yes Yes  Type of Estate agent of [place];Living will Healthcare Power of [employer] of [place];Living will Healthcare Power of [place];Living will Healthcare Power of [place];Living will Healthcare Power of [place];Living will  Does patient want to make changes to medical advance directive?  No - Patient declined  No - Patient declined No - Patient declined  No - Patient declined  Copy of Healthcare Power of Attorney in Chart? No - copy requested    No - copy requested No - copy requested    Would patient like information on creating a medical advance directive?   No - Patient declined        Current Medications (verified) Outpatient Encounter Medications as of 01/05/2023  Medication Sig   Alirocumab (PRALUENT) 75 MG/ML SOAJ Inject 1 Pen into the skin every 14 (fourteen) days.   aspirin EC 81 MG tablet Take 1 tablet (81 mg total) by mouth daily.   Cholecalciferol (VITAMIN D-3 PO) Take 1 tablet by mouth 3 (three) times a week.   citalopram (CELEXA) 20 MG tablet TAKE 1 TABLET (20 MG TOTAL) BY MOUTH DAILY. FOR ANXIETY.   clobetasol cream (TEMOVATE) 0.05 % Apply 1 application  topically daily.   clopidogrel (PLAVIX) 75 MG tablet Take 1 tablet (75 mg total) by mouth daily with breakfast.   Cyanocobalamin (B-12) 1000 MCG CAPS Take 1 tablet by mouth 3 (three) times a week.   EPINEPHrine 0.3 mg/0.3 mL IJ SOAJ injection Inject 0.3 mg into the muscle as needed for anaphylaxis.   metoprolol tartrate (LOPRESSOR) 25 MG tablet Take 1 tablet (25 mg total) by mouth 2 (two) times daily.   nitroGLYCERIN (NITROLINGUAL) 0.4 MG/SPRAY spray Place 1 spray under the tongue every 5 (five) minutes as needed for chest pain.   diphenhydrAMINE (BENADRYL) 25 MG tablet Take 1 tablet (25 mg total) by mouth every 6 (six) hours as needed (rash, itching). (Patient not taking: Reported on 01/05/2023)  No facility-administered encounter medications on file as of 01/05/2023.    Allergies (verified) Alpha-gal, Macrobid [nitrofurantoin], Atorvastatin, Lisinopril, Milk protein, Nitroglycerin, Other, Pravastatin, Repatha [evolocumab], Rosuvastatin, Shellfish allergy, Simvastatin, Statins, and Zetia [ezetimibe]   History: Past Medical History:  Diagnosis Date   Allergy to alpha-gal    Breast cancer (HCC) 10/23/2020   CAD (coronary artery disease)    Chronic neck pain 04/11/2018   Coronary artery calcification seen on CAT scan    Eczema    Essential hypertension  07/20/2019   Fatigue    GAD (generalized anxiety disorder) 06/26/2018   Hyperlipidemia    Lichen sclerosus    Overweight (BMI 25.0-29.9) 01/09/2018   Recurrent upper respiratory infection (URI)    Routine general medical examination at a health care facility    Urinary frequency 06/06/2019   Urticaria    Vaginal itching 06/06/2019   Past Surgical History:  Procedure Laterality Date   ANTERIOR CERVICAL DECOMP/DISCECTOMY FUSION     BREAST LUMPECTOMY WITH RADIOACTIVE SEED LOCALIZATION Left 11/14/2020   Procedure: LEFT BREAST LUMPECTOMY WITH RADIOACTIVE SEED LOCALIZATION;  Surgeon: Harriette Bouillon, MD;  Location: [facility];  Service: General;  Laterality: Left;   CESAREAN SECTION     1982 and 1983   COLONOSCOPY WITH PROPOFOL N/A 10/06/2020   Procedure: COLONOSCOPY WITH PROPOFOL;  Surgeon: Pasty Spillers, MD;  Location: [facility];  Service: Endoscopy;  Laterality: N/A;   CORONARY STENT INTERVENTION N/A 06/30/2022   Procedure: CORONARY STENT INTERVENTION;  Surgeon: Tonny Bollman, MD;  Location: [facility] INVASIVE CV LAB;  Service: Cardiovascular;  Laterality: N/A;   LEFT HEART CATH AND CORONARY ANGIOGRAPHY N/A 06/30/2022   Procedure: LEFT HEART CATH AND CORONARY ANGIOGRAPHY;  Surgeon: Tonny Bollman, MD;  Location: [facility] INVASIVE CV LAB;  Service: Cardiovascular;  Laterality: N/A;   PARTIAL HYSTERECTOMY     TUBAL LIGATION  1985   Family History  Problem Relation Age of Onset   Hydrocephalus Mother    Neuropathy Mother    Diabetes Father    Hypertension Father    Thyroid cancer Cousin    Thyroid disease Maternal Aunt    Social History   Socioeconomic History   Marital status: Divorced    Spouse name: Not on file   Number of children: 2   Years of education: Not on file   Highest education level: 12th grade  Occupational History   Not on file  Tobacco Use   Smoking status: Never   Smokeless tobacco: Never  Vaping Use   Vaping status: Never Used  Substance  and Sexual Activity   Alcohol use: Not Currently    Alcohol/week: 0.0 standard drinks of alcohol   Drug use: No   Sexual activity: Not on file  Other Topics Concern   Not on file  Social History Narrative   Divorced.  G3P2.   Regular exercise-yes   Social Determinants of Health   Financial Resource Strain: Low Risk  (01/05/2023)   Overall Financial Resource Strain (CARDIA)    Difficulty of Paying Living Expenses: Not hard at all  Food Insecurity: No Food Insecurity (01/05/2023)   Hunger Vital Sign    Worried About Running Out of Food in the Last Year: Never true    Ran Out of Food in the Last Year: Never true  Transportation Needs: No Transportation Needs (01/05/2023)   PRAPARE - Administrator, Civil Service (Medical): No    Lack of Transportation (Non-Medical): No  Physical Activity: Insufficiently Active (01/05/2023)  Exercise Vital Sign    Days of Exercise per Week: 3 days    Minutes of Exercise per Session: 30 min  Stress: No Stress Concern Present (01/05/2023)   [employer] of Occupational Health - Occupational Stress Questionnaire    Feeling of Stress : Not at all  Social Connections: Moderately Integrated (01/05/2023)   Social Connection and Isolation Panel [NHANES]    Frequency of Communication with Friends and Family: More than three times a week    Frequency of Social Gatherings with Friends and Family: More than three times a week    Attends Religious Services: More than 4 times per year    Active Member of [employer] or Organizations: Yes    Attends Engineer, structural: More than 4 times per year    Marital Status: Divorced    Tobacco Counseling Counseling given: Not Answered   Clinical Intake:  Pre-visit preparation completed: Yes  Pain : No/denies pain     BMI - recorded: 27.44 Nutritional Status: BMI 25 -29 Overweight Nutritional Risks: None Diabetes: No  How often do you need to have someone help you when you read  instructions, pamphlets, or other written materials from your doctor or pharmacy?: 1 - Never  Interpreter Needed?: No  Information entered by :: C.Lonie Rummell LPN   Activities of Daily Living    01/03/2023    9:21 AM 06/30/2022    9:50 PM  In your present state of health, do you have any difficulty performing the following activities:  Hearing? 0 0  Vision? 1 0  Comment Cataracts   Difficulty concentrating or making decisions? 0 0  Walking or climbing stairs? 0 0  Dressing or bathing? 0 0  Doing errands, shopping? 0 0  Preparing Food and eating ? N   Using the Toilet? N   In the past six months, have you accidently leaked urine? Y   Comment Occasionally   Do you have problems with loss of bowel control? N   Managing your Medications? N   Managing your Finances? N   Housekeeping or managing your Housekeeping? N     Patient Care Team: Doreene Nest, NP as PCP - General (Internal Medicine) [name], Deloris Ping, MD as PCP - Cardiology (Cardiology) Clyda Hurdle (Physician Assistant) Annamaria Helling, MD as Consulting Physician (Obstetrics and Gynecology) Pershing Proud, RN as Oncology Nurse Navigator Donnelly Angelica, RN as Oncology Nurse Navigator [facility]  Indicate any recent Medical Services you may have received from other than [facility] providers in the past year (date may be approximate).     Assessment:   This is a routine wellness examination for Nancy [name].  Hearing/Vision screen Hearing Screening - Comments:: Denies hearing difficulties   Vision Screening - Comments:: Glasses - has cataracts - [facility] - UTD on exams  Dietary issues and exercise activities discussed:     Goals Addressed             This Visit's Progress    Patient Stated       Stay healthy.       Depression Screen    01/05/2023   11:15 AM 10/19/2022    2:15 PM 08/31/2022   10:12 AM 01/06/2022    8:29 AM 08/20/2021    9:07 AM 08/06/2020    8:53 AM 05/26/2020   10:22 AM   PHQ 2/9 Scores  PHQ - 2 Score 0 0 0 0 0 0 0  PHQ- 9 Score  0 0 0 1    Fall Risk    01/05/2023   11:17 AM 01/03/2023    9:21 AM 10/19/2022    2:15 PM 08/31/2022   10:12 AM 01/06/2022    8:32 AM  Fall Risk   Falls in the past year? 0 0 0 0 0  Number falls in past yr: 0  0 0 0  Injury with Fall? 0  0 0 0  Risk for fall due to : No Fall Risks  No Fall Risks No Fall Risks No Fall Risks  Follow up Falls prevention discussed;Falls evaluation completed  Falls evaluation completed Falls evaluation completed Falls evaluation completed    MEDICARE RISK AT HOME:   TIMED UP AND GO:  Was the test performed?  No    Cognitive Function:        01/05/2023   11:18 AM 01/06/2022    8:34 AM  6CIT Screen  What Year? 0 points 0 points  What month? 0 points 0 points  What time? 0 points 0 points  Count back from 20 0 points 0 points  Months in reverse 0 points 0 points  Repeat phrase 0 points 0 points  Total Score 0 points 0 points    Immunizations Immunization History  Administered Date(s) Administered   Fluad Quad(high Dose 65+) 05/26/2020   Influenza,inj,Quad PF,6+ Mos 04/11/2018   Moderna Sars-Covid-2 Vaccination 08/24/2019, 09/18/2019   Pneumococcal Conjugate-13 08/06/2020   Pneumococcal Polysaccharide-23 08/20/2021   Tdap 12/13/2015    TDAP status: Up to date  Flu Vaccine status: Up to date  Pneumococcal vaccine status: Up to date  Covid-19 vaccine status: Information provided on how to obtain vaccines.   Qualifies for Shingles Vaccine? Yes   Zostavax completed  unknown   Shingrix Completed?: No.    Education has been provided regarding the importance of this vaccine. Patient has been advised to call insurance company to determine out of pocket expense if they have not yet received this vaccine. Advised may also receive vaccine at local pharmacy or Health Dept. Verbalized acceptance and understanding.  Screening Tests Health Maintenance  Topic Date Due   Zoster  Vaccines- Shingrix (1 of 2) Never done   COVID-19 Vaccine (3 - Moderna risk series) 10/16/2019   Medicare Annual Wellness (AWV)  01/07/2023   INFLUENZA VACCINE  01/13/2023   MAMMOGRAM  10/28/2024   DTaP/Tdap/Td (2 - Td or Tdap) 12/12/2025   Colonoscopy  10/07/2030   Pneumonia Vaccine 15+ Years old  Completed   DEXA SCAN  Completed   Hepatitis C Screening  Completed   HPV VACCINES  Aged Out    Health Maintenance  Health Maintenance Due  Topic Date Due   Zoster Vaccines- Shingrix (1 of 2) Never done   COVID-19 Vaccine (3 - Moderna risk series) 10/16/2019   Medicare Annual Wellness (AWV)  01/07/2023    Colorectal cancer screening: Type of screening: Colonoscopy. Completed 10/06/20. Repeat every 10 years  Mammogram status: Completed 10/29/22. Repeat every year  Bone Density status: Completed 10/21/20. Results reflect: Bone density results: NORMAL. Repeat every 5 years.  Lung Cancer Screening: (Low Dose CT Chest recommended if Age 97-80 years, 20 pack-year currently smoking OR have quit w/in 15years.) does not qualify.   Lung Cancer Screening Referral: n/a  Additional Screening:  Hepatitis C Screening: does qualify; Completed 01/09/18  Vision Screening: Recommended annual ophthalmology exams for early detection of glaucoma and other disorders of the eye. Is the patient up to date with their annual eye  exam?  Yes  Who is the provider or what is the name of the office in which the patient attends annual eye exams? [facility] If pt is not established with a provider, would they like to be referred to a provider to establish care? Yes .   Dental Screening: Recommended annual dental exams for proper oral hygiene    Community Resource Referral / Chronic Care Management: CRR required this visit?  No   CCM required this visit?  No     Plan:     I have personally reviewed and noted the following in the patient's chart:   Medical and social history Use of alcohol, tobacco  or illicit drugs  Current medications and supplements including opioid prescriptions. Patient is not currently taking opioid prescriptions. Functional ability and status Nutritional status Physical activity Advanced directives List of other physicians Hospitalizations, surgeries, and ER visits in previous 12 months Vitals Screenings to include cognitive, depression, and falls Referrals and appointments  In addition, I have reviewed and discussed with patient certain preventive protocols, quality metrics, and best practice recommendations. A written personalized care plan for preventive services as well as general preventive health recommendations were provided to patient.     Maryan Puls, LPN   0/03/2724   After Visit Summary: (MyChart) Due to this being a telephonic visit, the after visit summary with patients personalized plan was offered to patient via MyChart   Nurse Notes: none

## 2023-01-05 NOTE — Patient Instructions (Signed)
Ms. Nancy [name] , Thank you for taking time to come for your Medicare Wellness Visit. I appreciate your ongoing commitment to your health goals. Please review the following plan we discussed and let me know if I can assist you in the future.   These are the goals we discussed:  Goals      DIET - EAT MORE FRUITS AND VEGETABLES     Patient Stated     Stay healthy.        This is a list of the screening recommended for you and due dates:  Health Maintenance  Topic Date Due   Zoster (Shingles) Vaccine (1 of 2) Never done   COVID-19 Vaccine (3 - Moderna risk series) 10/16/2019   Medicare Annual Wellness Visit  01/07/2023   Flu Shot  01/13/2023   Mammogram  10/28/2024   DTaP/Tdap/Td vaccine (2 - Td or Tdap) 12/12/2025   Colon Cancer Screening  10/07/2030   Pneumonia Vaccine  Completed   DEXA scan (bone density measurement)  Completed   Hepatitis C Screening  Completed   HPV Vaccine  Aged Out    Advanced directives: Please bring a copy of your health care power of attorney and living will to the office to be added to your chart at your convenience.   Conditions/risks identified: Aim for 30 minutes of exercise or brisk walking, 6-8 glasses of water, and 5 servings of fruits and vegetables each day.   Next appointment: Follow up in one year for your annual wellness visit 01/10/24 @ 8:45 televisit   Preventive Care 65 Years and Older, Female Preventive care refers to lifestyle choices and visits with your health care provider that can promote health and wellness. What does preventive care include? A yearly physical exam. This is also called an annual well check. Dental exams once or twice a year. Routine eye exams. Ask your health care provider how often you should have your eyes checked. Personal lifestyle choices, including: Daily care of your teeth and gums. Regular physical activity. Eating a healthy diet. Avoiding tobacco and drug use. Limiting alcohol use. Practicing safe  sex. Taking low-dose aspirin every day. Taking vitamin and mineral supplements as recommended by your health care provider. What happens during an annual well check? The services and screenings done by your health care provider during your annual well check will depend on your age, overall health, lifestyle risk factors, and family history of disease. Counseling  Your health care provider may ask you questions about your: Alcohol use. Tobacco use. Drug use. Emotional well-being. Home and relationship well-being. Sexual activity. Eating habits. History of falls. Memory and ability to understand (cognition). Work and work Astronomer. Reproductive health. Screening  You may have the following tests or measurements: Height, weight, and BMI. Blood pressure. Lipid and cholesterol levels. These may be checked every 5 years, or more frequently if you are over 39 years old. Skin check. Lung cancer screening. You may have this screening every year starting at age 36 if you have a 30-pack-year history of smoking and currently smoke or have quit within the past 15 years. Fecal occult blood test (FOBT) of the stool. You may have this test every year starting at age 80. Flexible sigmoidoscopy or colonoscopy. You may have a sigmoidoscopy every 5 years or a colonoscopy every 10 years starting at age 64. Hepatitis C blood test. Hepatitis B blood test. Sexually transmitted disease (STD) testing. Diabetes screening. This is done by checking your blood sugar (glucose) after you have not  eaten for a while (fasting). You may have this done every 1-3 years. Bone density scan. This is done to screen for osteoporosis. You may have this done starting at age 26. Mammogram. This may be done every 1-2 years. Talk to your health care provider about how often you should have regular mammograms. Talk with your health care provider about your test results, treatment options, and if necessary, the need for more  tests. Vaccines  Your health care provider may recommend certain vaccines, such as: Influenza vaccine. This is recommended every year. Tetanus, diphtheria, and acellular pertussis (Tdap, Td) vaccine. You may need a Td booster every 10 years. Zoster vaccine. You may need this after age 1. Pneumococcal 13-valent conjugate (PCV13) vaccine. One dose is recommended after age 14. Pneumococcal polysaccharide (PPSV23) vaccine. One dose is recommended after age 81. Talk to your health care provider about which screenings and vaccines you need and how often you need them. This information is not intended to replace advice given to you by your health care provider. Make sure you discuss any questions you have with your health care provider. Document Released: 06/27/2015 Document Revised: 02/18/2016 Document Reviewed: 04/01/2015 Elsevier Interactive Patient Education  2017 [employer].  Fall Prevention in the Home Falls can cause injuries. They can happen to people of all ages. There are many things you can do to make your home safe and to help prevent falls. What can I do on the outside of my home? Regularly fix the edges of walkways and driveways and fix any cracks. Remove anything that might make you trip as you walk through a door, such as a raised step or threshold. Trim any bushes or trees on the path to your home. Use bright outdoor lighting. Clear any walking paths of anything that might make someone trip, such as rocks or tools. Regularly check to see if handrails are loose or broken. Make sure that both sides of any steps have handrails. Any raised decks and porches should have guardrails on the edges. Have any leaves, snow, or ice cleared regularly. Use sand or salt on walking paths during winter. Clean up any spills in your garage right away. This includes oil or grease spills. What can I do in the bathroom? Use night lights. Install grab bars by the toilet and in the tub and shower.  Do not use towel bars as grab bars. Use non-skid mats or decals in the tub or shower. If you need to sit down in the shower, use a plastic, non-slip stool. Keep the floor dry. Clean up any water that spills on the floor as soon as it happens. Remove soap buildup in the tub or shower regularly. Attach bath mats securely with double-sided non-slip rug tape. Do not have throw rugs and other things on the floor that can make you trip. What can I do in the bedroom? Use night lights. Make sure that you have a light by your bed that is easy to reach. Do not use any sheets or blankets that are too big for your bed. They should not hang down onto the floor. Have a firm chair that has side arms. You can use this for support while you get dressed. Do not have throw rugs and other things on the floor that can make you trip. What can I do in the kitchen? Clean up any spills right away. Avoid walking on wet floors. Keep items that you use a lot in easy-to-reach places. If you need to reach  something above you, use a strong step stool that has a grab bar. Keep electrical cords out of the way. Do not use floor polish or wax that makes floors slippery. If you must use wax, use non-skid floor wax. Do not have throw rugs and other things on the floor that can make you trip. What can I do with my stairs? Do not leave any items on the stairs. Make sure that there are handrails on both sides of the stairs and use them. Fix handrails that are broken or loose. Make sure that handrails are as long as the stairways. Check any carpeting to make sure that it is firmly attached to the stairs. Fix any carpet that is loose or worn. Avoid having throw rugs at the top or bottom of the stairs. If you do have throw rugs, attach them to the floor with carpet tape. Make sure that you have a light switch at the top of the stairs and the bottom of the stairs. If you do not have them, ask someone to add them for you. What else  can I do to help prevent falls? Wear shoes that: Do not have high heels. Have rubber bottoms. Are comfortable and fit you well. Are closed at the toe. Do not wear sandals. If you use a stepladder: Make sure that it is fully opened. Do not climb a closed stepladder. Make sure that both sides of the stepladder are locked into place. Ask someone to hold it for you, if possible. Clearly mark and make sure that you can see: Any grab bars or handrails. First and last steps. Where the edge of each step is. Use tools that help you move around (mobility aids) if they are needed. These include: Canes. Walkers. Scooters. Crutches. Turn on the lights when you go into a dark area. Replace any light bulbs as soon as they burn out. Set up your furniture so you have a clear path. Avoid moving your furniture around. If any of your floors are uneven, fix them. If there are any pets around you, be aware of where they are. Review your medicines with your doctor. Some medicines can make you feel dizzy. This can increase your chance of falling. Ask your doctor what other things that you can do to help prevent falls. This information is not intended to replace advice given to you by your health care provider. Make sure you discuss any questions you have with your health care provider. Document Released: 03/27/2009 Document Revised: 11/06/2015 Document Reviewed: 07/05/2014 Elsevier Interactive Patient Education  2017 [employer].

## 2023-01-06 ENCOUNTER — Telehealth: Payer: Self-pay | Admitting: Pharmacist

## 2023-01-06 NOTE — Telephone Encounter (Signed)
Patient called stating that she has been having some chest tightness and shortness of breath.  Was walking through [employer] and experience this.  Thinks it might be related to her Praluent.  Advised that I do not think this is associated with her Praluent.  Advised that she could skip a dose if she would like to see if this improves however I do not think it is related.  Advised that if she continues to have chest tightness and shortness of breath that she needs to be evaluated by a doctor.  If chest tightness does not resolve on its own she should seek care at the ER.  If she continues to have episodes that improve without intervention she should still make an earlier appointment with our office.

## 2023-01-24 ENCOUNTER — Ambulatory Visit: Payer: PPO | Attending: Primary Care

## 2023-01-24 ENCOUNTER — Ambulatory Visit: Payer: PPO | Admitting: Gastroenterology

## 2023-01-24 DIAGNOSIS — I2510 Atherosclerotic heart disease of native coronary artery without angina pectoris: Secondary | ICD-10-CM | POA: Diagnosis not present

## 2023-01-24 DIAGNOSIS — E782 Mixed hyperlipidemia: Secondary | ICD-10-CM

## 2023-01-24 LAB — LIPID PANEL
Chol/HDL Ratio: 2.6 ratio (ref 0.0–4.4)
Cholesterol, Total: 190 mg/dL (ref 100–199)
HDL: 73 mg/dL (ref >39)
LDL Chol Calc (NIH): 103 mg/dL — ABNORMAL HIGH (ref 0–99)
Triglycerides: 75 mg/dL (ref 0–149)
VLDL Cholesterol Cal: 14 mg/dL (ref 5–40)

## 2023-01-24 LAB — ALT: ALT: 12 IU/L (ref 0–32)

## 2023-02-11 ENCOUNTER — Ambulatory Visit
Admission: RE | Admit: 2023-02-11 | Discharge: 2023-02-11 | Disposition: A | Discharge status: Home / Self Care | Payer: PPO | Source: Ambulatory Visit | Attending: Primary Care | Admitting: Primary Care

## 2023-02-11 DIAGNOSIS — M8588 Other specified disorders of bone density and structure, other site: Secondary | ICD-10-CM | POA: Diagnosis not present

## 2023-02-11 DIAGNOSIS — E349 Endocrine disorder, unspecified: Secondary | ICD-10-CM | POA: Diagnosis not present

## 2023-02-11 DIAGNOSIS — E2839 Other primary ovarian failure: Secondary | ICD-10-CM

## 2023-02-11 DIAGNOSIS — N958 Other specified menopausal and perimenopausal disorders: Secondary | ICD-10-CM | POA: Diagnosis not present

## 2023-02-12 ENCOUNTER — Encounter: Payer: Self-pay | Admitting: Cardiovascular Disease

## 2023-02-15 NOTE — Telephone Encounter (Signed)
[name], Nancy [place], [name]      Recommend she try to increase calcium intake from food and diet rather than supplementation as supplements have show to increase CV risk

## 2023-02-16 ENCOUNTER — Telehealth: Payer: Self-pay | Admitting: *Deleted

## 2023-02-16 NOTE — Telephone Encounter (Signed)
   Patient Name: Nancy [name]  DOB: 02-May-1956 MRN: [id number]  Primary Cardiologist: Kristeen Miss, MD  Chart reviewed as part of pre-operative protocol coverage. Cataract extractions are recognized in guidelines as low risk surgeries that do not typically require specific preoperative testing or holding of blood thinner therapy. Therefore, given past medical history and time since last visit, based on ACC/AHA guidelines, Nancy [name] would be at acceptable risk for the planned procedure without further cardiovascular testing.   I will route this recommendation to the requesting party via Epic fax function and remove from pre-op pool.  Please call with questions.  Joylene Grapes, NP 02/16/2023, 11:01 AM

## 2023-02-16 NOTE — Telephone Encounter (Signed)
   Pre-operative Risk Assessment    Patient Name: SHERYLANN PRIEUR  DOB: 08/27/1955 MRN: [id number]    DATE OF LAST VISIT: 10/19/22 DR. NASHER DATE OF NEXT VISIT: 04/22/23 DR. [name]  Request for Surgical Clearance    Procedure:   CATARACT EXTRACTION WITH INTRAOCULAR LENS IMPLANT OF LEFT EYE  TO BE DONE ON 02/23/23; FOLLOWED BY RIGHT EYE TO BE DONE 03/09/23  Date of Surgery:  Clearance 02/23/23                                 Surgeon:  DR. Mia Creek Surgeon's Group or Practice Name:  [facility] Phone number:  [phone] Fax number:  [phone]   Type of Clearance Requested:   - Medical ; PER CLEARANCE FORM NO MEDICATIONS ARE NEEDING TO BE HELD   Type of Anesthesia:   TOPICAL ANESTHESIA WITH IV MEDICATION   Additional requests/questions:    Elpidio Anis   02/16/2023, 10:52 AM

## 2023-02-23 ENCOUNTER — Other Ambulatory Visit: Payer: Self-pay | Admitting: Primary Care

## 2023-02-23 DIAGNOSIS — L509 Urticaria, unspecified: Secondary | ICD-10-CM

## 2023-02-23 DIAGNOSIS — H2513 Age-related nuclear cataract, bilateral: Secondary | ICD-10-CM | POA: Diagnosis not present

## 2023-02-23 DIAGNOSIS — H2512 Age-related nuclear cataract, left eye: Secondary | ICD-10-CM | POA: Diagnosis not present

## 2023-02-24 DIAGNOSIS — H25011 Cortical age-related cataract, right eye: Secondary | ICD-10-CM | POA: Diagnosis not present

## 2023-02-24 DIAGNOSIS — H2511 Age-related nuclear cataract, right eye: Secondary | ICD-10-CM | POA: Diagnosis not present

## 2023-02-24 DIAGNOSIS — H25041 Posterior subcapsular polar age-related cataract, right eye: Secondary | ICD-10-CM | POA: Diagnosis not present

## 2023-03-09 DIAGNOSIS — H2511 Age-related nuclear cataract, right eye: Secondary | ICD-10-CM | POA: Diagnosis not present

## 2023-03-10 DIAGNOSIS — H2513 Age-related nuclear cataract, bilateral: Secondary | ICD-10-CM | POA: Diagnosis not present

## 2023-04-21 ENCOUNTER — Encounter: Payer: Self-pay | Admitting: Cardiovascular Disease

## 2023-04-21 NOTE — Progress Notes (Signed)
Cardiology Office Note:    Date:  04/22/2023   ID:  Nancy [name], DOB 08-Dec-1955, MRN [id number]  PCP:  Doreene Nest, NP  Cardiologist:  Taneah Masri  Electrophysiologist:  None   Referring MD: Doreene Nest, NP   Problem List 1. Anxiety:  2.  Hyperlipidemia  3.  Hypertension   Chief Complaint  Patient presents with   Hyperlipidemia   Hypertension        Coronary Artery Disease         History of Present Illness:    Nancy [name] is a 67 y.o. female with a hx of anxiety ,   ? HTN. We were asked to see her by Vernona Rieger, NP for further evaluation of an episode of face tingling and HTN.  Mother of Nancy [name] ( has premature CAD )    Has had some tingling on the side of her face.   Became very anxious Called EMS,  BP was very elevated.  She tried amlopidine .  She had recurrent episodes of marked HTN and face tingling .  BP was 220   [name] changed the amlodipine to Lisinopril 10 mg BID  BP continued to spike  Was started on metoprolol   Admits that she does not watch her salt .  Eats fast foods frequently for lunch  Dinner  Typically fried or salty foods.    September 06, 2019:  Nancy [name] is seen for follow-up visit for hyperlipidemia and hypertension. We started her on chlorthalidone during her last visit.    Coronary calcium score of 243. This was 91st percentile for age and sex matched control.  We saw her on Crestor at that time.  Has been on a low salt diet.  Her primary MD cut her lisinopril and metoprolol   December 07, 2019:  Nancy [name] is seen today for follow-up of her hyperlipidemia and hypertension. Feeling well,  Doing great.  She is now on Repatha  BP and HR are very well controlled on metoprolol 12,5 BID Her anxiety is much better controlled   April 24, 2021: Nancy [name] is seen today for follow-up of her hyperlipidemia and hypertension. Still having some chest wall pain .    Has not been getting regular exercise  Only sporadic exercise    Has developed alpha gal -  found a wood tick on her  Would develop a rash anytime she ate beef.  tested  positive for alpha gal Also allergic to milk products    Oct 19, 2022 Nancy [name] is seen after a 3 year absence. Hx of coronary stenting in Jan. 2024   Her last LDL was 104 but she was off the pralulent     Hx of HLD  HTN, Alpha gal  BP went low one day and she lowered her metoprolol  Had another tick bite  Had 3 days of night sweats  Exercises regularly  They own cows,   She is likely to need cataract surgery at some point.  She is at low risk for her upcoming cataract surgery.  I would not like for her to hold aspirin or Plavix at least until January of next year because of her recent stent.   Nov. 8, 2024  Nancy [name] is seen for follow up of her CAD, HLD , HTN  Left heart catheterization performed June 30, 2022 revealed severe LAD disease with tandem 50% and 80% stenosis in the mid LAD.  This was successfully treated with a 2.75 x 28 mm  Synergy DES postdilated with a 3.0 noncompliant balloon.  Has had some costochrondritis    Also needs to have a colonoscopy  Will need to hold her aspirin and Plavix I would suspect.  I have given her the okay to have her colonoscopy in January, 2025 so that it will be 1 year since her previous stent procedure.  At that time she will be at low risk for her upcoming colonoscopy and may hold the aspirin and Plavix for 5 to 7 days prior to the procedure.  BP is elevated today  She stopped her metoprolol   BP is back up  She has not been careful with her diet - eating more salt than she should     Past Medical History:  Diagnosis Date   Allergy to alpha-gal    Breast cancer (HCC) 10/23/2020   CAD (coronary artery disease)    Chronic neck pain 04/11/2018   Coronary artery calcification seen on CAT scan    Eczema    Essential hypertension 07/20/2019   Fatigue    GAD (generalized anxiety disorder) 06/26/2018   Hyperlipidemia    Lichen  sclerosus    Overweight (BMI 25.0-29.9) 01/09/2018   Recurrent upper respiratory infection (URI)    Routine general medical examination at a health care facility    Urinary frequency 06/06/2019   Urticaria    Vaginal itching 06/06/2019    Past Surgical History:  Procedure Laterality Date   ANTERIOR CERVICAL DECOMP/DISCECTOMY FUSION     BREAST LUMPECTOMY WITH RADIOACTIVE SEED LOCALIZATION Left 11/14/2020   Procedure: LEFT BREAST LUMPECTOMY WITH RADIOACTIVE SEED LOCALIZATION;  Surgeon: Harriette Bouillon, MD;  Location: [facility];  Service: General;  Laterality: Left;   CESAREAN SECTION     1982 and 1983   COLONOSCOPY WITH PROPOFOL N/A 10/06/2020   Procedure: COLONOSCOPY WITH PROPOFOL;  Surgeon: Pasty Spillers, MD;  Location: [facility];  Service: Endoscopy;  Laterality: N/A;   CORONARY STENT INTERVENTION N/A 06/30/2022   Procedure: CORONARY STENT INTERVENTION;  Surgeon: Tonny Bollman, MD;  Location: [facility] INVASIVE CV LAB;  Service: Cardiovascular;  Laterality: N/A;   LEFT HEART CATH AND CORONARY ANGIOGRAPHY N/A 06/30/2022   Procedure: LEFT HEART CATH AND CORONARY ANGIOGRAPHY;  Surgeon: Tonny Bollman, MD;  Location[facility] INVASIVE CV LAB;  Service: Cardiovascular;  Laterality: N/A;   PARTIAL HYSTERECTOMY     TUBAL LIGATION  1985    Current Medications: Current Meds  Medication Sig   Alirocumab (PRALUENT) 75 MG/ML SOAJ Inject 1 Pen into the skin every 14 (fourteen) days.   aspirin EC 81 MG tablet Take 1 tablet (81 mg total) by mouth daily.   Cholecalciferol (VITAMIN D-3 PO) Take 1 tablet by mouth 3 (three) times a week.   citalopram (CELEXA) 20 MG tablet TAKE 1 TABLET (20 MG TOTAL) BY MOUTH DAILY. FOR ANXIETY.   clobetasol cream (TEMOVATE) 0.05 % Apply 1 application  topically daily.   clopidogrel (PLAVIX) 75 MG tablet Take 1 tablet (75 mg total) by mouth daily with breakfast.   Cyanocobalamin (B-12) 1000 MCG CAPS Take 1 tablet by mouth 3 (three) times a week.    diphenhydrAMINE (BENADRYL) 25 MG tablet Take 1 tablet (25 mg total) by mouth every 6 (six) hours as needed (rash, itching).   nebivolol (BYSTOLIC) 10 MG tablet Take 1 tablet (10 mg total) by mouth daily.   nitroGLYCERIN (NITROLINGUAL) 0.4 MG/SPRAY spray Place 1 spray under the tongue every 5 (five) minutes as needed for chest pain.  Allergies:   Alpha-gal, Macrobid [nitrofurantoin], Atorvastatin, Lisinopril, Milk protein, Nitroglycerin, Other, Pravastatin, Repatha [evolocumab], Rosuvastatin, Shellfish allergy, Simvastatin, Statins, and Zetia [ezetimibe]   Social History   Socioeconomic History   Marital status: Divorced    Spouse name: Not on file   Number of children: 2   Years of education: Not on file   Highest education level: 12th grade  Occupational History   Not on file  Tobacco Use   Smoking status: Never   Smokeless tobacco: Never  Vaping Use   Vaping status: Never Used  Substance and Sexual Activity   Alcohol use: Not Currently    Alcohol/week: 0.0 standard drinks of alcohol   Drug use: No   Sexual activity: Not on file  Other Topics Concern   Not on file  Social History Narrative   Divorced.  G3P2.   Regular exercise-yes   Social Determinants of Health   Financial Resource Strain: Low Risk  (01/05/2023)   Overall Financial Resource Strain (CARDIA)    Difficulty of Paying Living Expenses: Not hard at all  Food Insecurity: No Food Insecurity (01/05/2023)   Hunger Vital Sign    Worried About Running Out of Food in the Last Year: Never true    Ran Out of Food in the Last Year: Never true  Transportation Needs: No Transportation Needs (01/05/2023)   PRAPARE - Administrator, Civil Service (Medical): No    Lack of Transportation (Non-Medical): No  Physical Activity: Insufficiently Active (01/05/2023)   Exercise Vital Sign    Days of Exercise per Week: 3 days    Minutes of Exercise per Session: 30 min  Stress: No Stress Concern Present (01/05/2023)    [employer] of Occupational Health - Occupational Stress Questionnaire    Feeling of Stress : Not at all  Social Connections: Moderately Integrated (01/05/2023)   Social Connection and Isolation Panel [NHANES]    Frequency of Communication with Friends and Family: More than three times a week    Frequency of Social Gatherings with Friends and Family: More than three times a week    Attends Religious Services: More than 4 times per year    Active Member of [employer] or Organizations: Yes    Attends Engineer, structural: More than 4 times per year    Marital Status: Divorced     Family History: The patient's family history includes Diabetes in her father; Hydrocephalus in her mother; Hypertension in her father; Neuropathy in her mother; Thyroid cancer in her cousin; Thyroid disease in her maternal aunt.  ROS:   Please see the history of present illness.     All other systems reviewed and are negative.  EKGs/Labs/Other Studies Reviewed:    The following studies were reviewed today:      Recent Labs: 06/21/2022: Magnesium 2.3; TSH 1.010 07/01/2022: BUN 10; Creatinine, Ser 0.71; Hemoglobin 12.8; Platelets 228; Potassium 3.9; Sodium 136 01/24/2023: ALT 12  Recent Lipid Panel    Component Value Date/Time   CHOL 190 01/24/2023 0834   TRIG 75 01/24/2023 0834   HDL 73 01/24/2023 0834   CHOLHDL 2.6 01/24/2023 0834   CHOLHDL 3 08/31/2022 1042   VLDL 19.4 08/31/2022 1042   LDLCALC 103 (H) 01/24/2023 0834   LDLDIRECT 197.5 11/02/2011 0819    Physical Exam:     Physical Exam: Blood pressure (!) 146/90, pulse 80, height 5\' 3"  (1.6 m), weight 154 lb (69.9 kg).  HYPERTENSION CONTROL Vitals:   04/22/23 1037 04/22/23 1049  BP: Marland Kitchen)  154/82 (!) 146/90    The patient's blood pressure is elevated above target today.  In order to address the patient's elevated BP:        GEN:  Well nourished, well developed in no acute distress HEENT: Normal NECK: No JVD; No carotid  bruits LYMPHATICS: No lymphadenopathy CARDIAC: RRR , soft systolic murmur  RESPIRATORY:  Clear to auscultation without rales, wheezing or rhonchi  ABDOMEN: Soft, non-tender, non-distended MUSCULOSKELETAL:  No edema; No deformity  SKIN: Warm and dry NEUROLOGIC:  Alert and oriented x 3   ECG:          ASSESSMENT:    No diagnosis found.    PLAN:     1.   CAD :    s/p stenting of her mid LAD in January, 2024.  She needs to have a colonoscopy.  Will delay her colonoscopy until January, 2025.  At that point she should be at low risk for her procedure.  She will be okay to hold her aspirin and Plavix for 5 to 7 days prior to the procedure.  1.   Hyperlipidemia:      continue Repatha   2.  Hypertension:      Blood pressure is slightly elevated.  She stopped her metoprolol.  Will restart her on Bystolic 10 mg a day.  She will continue to follow her blood pressures at home.  Have asked her to bring her blood pressure cuff to her next office visit.  Will have her follow-up with an APP in 3 months.  3.  Anxiety:     Medication Adjustments/Labs and Tests Ordered: Current medicines are reviewed at length with the patient today.  Concerns regarding medicines are outlined above.  No orders of the defined types were placed in this encounter.   Meds ordered this encounter  Medications   nebivolol (BYSTOLIC) 10 MG tablet    Sig: Take 1 tablet (10 mg total) by mouth daily.    Dispense:  90 tablet    Refill:  2      Patient Instructions  Medication Instructions:   STOP TAKING METOPROLOL TARTRATE  START TAKING BYSTOLIC 10 MG BY MOUTH DAILY  *If you need a refill on your cardiac medications before your next appointment, please call your pharmacy*    Follow-Up:  IN 3 MONTHS WITH AN EXTENDER IN THE OFFICE    Signed, Kristeen Miss, MD  04/22/2023 5:13 PM    [facility]

## 2023-04-22 ENCOUNTER — Encounter: Payer: Self-pay | Admitting: Cardiovascular Disease

## 2023-04-22 ENCOUNTER — Ambulatory Visit: Payer: PPO | Attending: Cardiovascular Disease | Admitting: Cardiovascular Disease

## 2023-04-22 VITALS — BP 146/90 | HR 80 | Ht 63.0 in | Wt 154.0 lb

## 2023-04-22 DIAGNOSIS — I2510 Atherosclerotic heart disease of native coronary artery without angina pectoris: Secondary | ICD-10-CM

## 2023-04-22 DIAGNOSIS — I10 Essential (primary) hypertension: Secondary | ICD-10-CM | POA: Diagnosis not present

## 2023-04-22 DIAGNOSIS — E782 Mixed hyperlipidemia: Secondary | ICD-10-CM

## 2023-04-22 MED ORDER — NEBIVOLOL HCL 10 MG PO TABS: 10.0000 mg | ORAL_TABLET | Freq: Every day | ORAL | 2 refills | Status: DC

## 2023-04-22 NOTE — Patient Instructions (Signed)
Medication Instructions:   STOP TAKING METOPROLOL TARTRATE  START TAKING BYSTOLIC 10 MG BY MOUTH DAILY  *If you need a refill on your cardiac medications before your next appointment, please call your pharmacy*    Follow-Up:  IN 3 MONTHS WITH AN EXTENDER IN THE OFFICE

## 2023-07-01 ENCOUNTER — Other Ambulatory Visit: Payer: Self-pay | Admitting: Student

## 2023-07-13 NOTE — Progress Notes (Signed)
  Cardiology Office Note:  .   Date:  07/26/2023  ID:  Nancy [name], DOB 08-01-1955, MRN [id number] PCP: Doreene Nest, NP  [facility] Providers Cardiologist:  Kristeen Miss, MD    History of Present Illness: .   Nancy [name] is a 68 y.o. female with history of CAD stent mLAD 06/2022, HTN, HLD, alpha gal, family history of CAD  Patient saw Dr. Elease Hashimoto 04/2023 and recommended she wait until 06/2023 for colonoscopy and then could hold ASA and Plavix 5-7 days prior to procedure.  Patient here for regular f/u. She complains of leg weakness and aching at night. She thinks it's the Praluent. 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 No chest pain, dyspnea, palpitations, edema. Hasn't seen anyone for colonoscopy.   ROS:    Studies Reviewed: Marland Kitchen    EKG Interpretation Date/Time:  Tuesday July 26 2023 09:51:07 EST Ventricular Rate:  65 PR Interval:  134 QRS Duration:  76 QT Interval:  410 QTC Calculation: 426 R Axis:   60  Text Interpretation: Normal sinus rhythm Normal ECG When compared with ECG of 01-Jul-2022 05:37, No significant change was found Confirmed by Jacolyn Reedy [phone]) on 07/26/2023 10:03:03 AM    Prior CV Studies:   Cath 06/2022 Severe single vessel CAD with tandem 50% and 80% lesions in the mid-LAD, treated successfully with a 2.75x28 mm Synergy DES post-dilated to high pressure with a 3.0 mm [place] balloon Nonobstructive RCA stenosis Patent left main and LCx without significant stenosis   Recommend overnight observation because of late in day procedure time. ASA/clopidogrel x 6 months minimum. GDMT/risk reduction measures for secondary prevention.    Risk Assessment/Calculations:           Physical Exam:   VS:  BP 120/78 (BP Location: Right Arm, Patient Position: Sitting, Cuff Size: Normal)   Pulse 67   Resp 16   Ht 5\' 3"  (1.6 m)   Wt 157 lb 6.4 oz (71.4 kg)   SpO2 95%   BMI 27.88 kg/m    Wt Readings from  Last 3 Encounters:  07/26/23 157 lb 6.4 oz (71.4 kg)  04/22/23 154 lb (69.9 kg)  01/05/23 150 lb (68 kg)    GEN: Well nourished, well developed in no acute distress NECK: No JVD; No carotid bruits CARDIAC:  RRR, no murmurs, rubs, gallops RESPIRATORY:  Clear to auscultation without rales, wheezing or rhonchi  ABDOMEN: Soft, non-tender, non-distended EXTREMITIES:  No edema; No deformity   ASSESSMENT AND PLAN: .     CAD DES mLAD 06/2022-no angina. Will discuss with Dr. Elease Hashimoto if she needs long term ASA/Plavix. She'd like to go back on metoprolol as she thinks the bystolic is dropping her BP too much.  HTN-controlled but wants to go back on metoprolol. Will give her metoprolol xl 25 mg 1/2 daily.  HLD-severe myalgias on Paluent. Discussed with pharmacy and will stop praluent and try nexletol approved        Dispo: f/u  Signed, Jacolyn Reedy, PA-C

## 2023-07-26 ENCOUNTER — Encounter: Payer: Self-pay | Admitting: Physician Assistant

## 2023-07-26 ENCOUNTER — Ambulatory Visit: Payer: PPO | Attending: Physician Assistant | Admitting: Physician Assistant

## 2023-07-26 VITALS — BP 120/78 | HR 67 | Resp 16 | Ht 63.0 in | Wt 157.4 lb

## 2023-07-26 DIAGNOSIS — I10 Essential (primary) hypertension: Secondary | ICD-10-CM

## 2023-07-26 DIAGNOSIS — I2510 Atherosclerotic heart disease of native coronary artery without angina pectoris: Secondary | ICD-10-CM

## 2023-07-26 DIAGNOSIS — E782 Mixed hyperlipidemia: Secondary | ICD-10-CM | POA: Diagnosis not present

## 2023-07-26 MED ORDER — METOPROLOL SUCCINATE ER 25 MG PO TB24: 12.5000 mg | ORAL_TABLET | Freq: Every day | ORAL | 3 refills | Status: DC

## 2023-07-26 NOTE — Patient Instructions (Addendum)
Medication Instructions:  Your physician has recommended you make the following change in your medication:   1) STOP alirocumab (Praluent) 2) STOP nebivolol (Bystolic) 3) START metoprolol succinate (Toprol XL) 12.5 mg daily  *If you need a refill on your cardiac medications before your next appointment, please call your pharmacy*  Follow-Up: At [facility], you and your health needs are our priority.  As part of our continuing mission to provide you with exceptional heart care, we have created designated Provider Care Teams.  These Care Teams include your primary Cardiologist (physician) and Advanced Practice Providers (APPs -  Physician Assistants and Nurse Practitioners) who all work together to provide you with the care you need, when you need it.  Your next appointment:   6 month(s)  The format for your next appointment:   In Person  Provider:   Weston Brass, MD {  Other Instructions You have been referred to our Lipid Clinic to meet with our pharmacist to discuss Nexlizet.    1st Floor: - Lobby - Registration  - Pharmacy  - Lab - Cafe  2nd Floor: - PV Lab - Diagnostic Testing (echo, CT, nuclear med)  3rd Floor: - Vacant  4th Floor: - TCTS (cardiothoracic surgery) - AFib Clinic - Structural Heart Clinic - Vascular Surgery  - Vascular Ultrasound  5th Floor: - HeartCare Cardiology (general and EP) - Clinical Pharmacy for coumadin, hypertension, lipid, weight-loss medications, and med management appointments    Valet parking services will be available as well.

## 2023-07-27 ENCOUNTER — Encounter: Payer: Self-pay | Admitting: Primary Care

## 2023-07-27 ENCOUNTER — Telehealth: Payer: Self-pay

## 2023-07-27 ENCOUNTER — Ambulatory Visit: Payer: PPO | Admitting: Primary Care

## 2023-07-27 ENCOUNTER — Ambulatory Visit (INDEPENDENT_AMBULATORY_CARE_PROVIDER_SITE_OTHER)
Admission: RE | Admit: 2023-07-27 | Discharge: 2023-07-27 | Disposition: A | Discharge status: Home / Self Care | Payer: PPO | Source: Ambulatory Visit | Attending: Primary Care | Admitting: Primary Care

## 2023-07-27 ENCOUNTER — Other Ambulatory Visit: Payer: Self-pay

## 2023-07-27 VITALS — BP 136/70 | HR 99 | Temp 97.2°F | Ht 63.0 in | Wt 159.0 lb

## 2023-07-27 DIAGNOSIS — E875 Hyperkalemia: Secondary | ICD-10-CM | POA: Diagnosis not present

## 2023-07-27 DIAGNOSIS — M25551 Pain in right hip: Secondary | ICD-10-CM

## 2023-07-27 DIAGNOSIS — M79605 Pain in left leg: Secondary | ICD-10-CM

## 2023-07-27 DIAGNOSIS — M79604 Pain in right leg: Secondary | ICD-10-CM | POA: Insufficient documentation

## 2023-07-27 DIAGNOSIS — M1611 Unilateral primary osteoarthritis, right hip: Secondary | ICD-10-CM | POA: Insufficient documentation

## 2023-07-27 HISTORY — DX: Hyperkalemia: E87.5

## 2023-07-27 LAB — BASIC METABOLIC PANEL
BUN/Creatinine Ratio: 12 (ref 12–28)
BUN: 9 mg/dL (ref 8–27)
CO2: 26 mmol/L (ref 20–29)
Calcium: 9.8 mg/dL (ref 8.7–10.3)
Chloride: 103 mmol/L (ref 96–106)
Creatinine, Ser: 0.75 mg/dL (ref 0.57–1.00)
Glucose: 88 mg/dL (ref 70–99)
Potassium: 5.5 mmol/L — ABNORMAL HIGH (ref 3.5–5.2)
Sodium: 143 mmol/L (ref 134–144)
eGFR: 87 mL/min/{1.73_m2} (ref >59)

## 2023-07-27 LAB — LIPID PANEL
Chol/HDL Ratio: 2.7 {ratio} (ref 0.0–4.4)
Cholesterol, Total: 214 mg/dL — ABNORMAL HIGH (ref 100–199)
HDL: 78 mg/dL (ref >39)
LDL Chol Calc (NIH): 115 mg/dL — ABNORMAL HIGH (ref 0–99)
Triglycerides: 124 mg/dL (ref 0–149)
VLDL Cholesterol Cal: 21 mg/dL (ref 5–40)

## 2023-07-27 LAB — CBC
Hematocrit: 43.5 % (ref 34.0–46.6)
Hemoglobin: 13.9 g/dL (ref 11.1–15.9)
MCH: 28.5 pg (ref 26.6–33.0)
MCHC: 32 g/dL (ref 31.5–35.7)
MCV: 89 fL (ref 79–97)
Platelets: 274 10*3/uL (ref 150–450)
RBC: 4.88 x10E6/uL (ref 3.77–5.28)
RDW: 12.5 % (ref 11.7–15.4)
WBC: 6.8 10*3/uL (ref 3.4–10.8)

## 2023-07-27 LAB — POTASSIUM: Potassium: 4.6 meq/L (ref 3.5–5.1)

## 2023-07-27 NOTE — Assessment & Plan Note (Signed)
Symptoms not quite suggestive of claudication.  As symptoms feel similar to her prior statin therapy, it is reasonable to conclude that Praluent is contributing. 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  Plain films of the right hip ordered and pending.  Remain off Praluent. Repeat potassium level pending.

## 2023-07-27 NOTE — Patient Instructions (Addendum)
Complete xray(s) and labs prior to leaving today. I will notify you of your results once received.  Schedule your physical for late March 2025.   It was a pleasure to see you today!

## 2023-07-27 NOTE — Telephone Encounter (Signed)
-----   Message from Kristeen Miss sent at 07/26/2023  2:39 PM EST ----- I prefer plavix 75 mg a day as monotherapy after the 1 year of DAPT. If she has any bleeding issues, ok to switch to ASA 81 mg a day   pN ----- Message ----- From: Collier Bullock Sent: 07/26/2023  10:43 AM EST To: Vesta Mixer, MD  Dr. Elease Hashimoto, this patient is wondering if you want her on plavix and ASA long term? Her LAD stent was a year ago. Thanks.

## 2023-07-27 NOTE — Assessment & Plan Note (Addendum)
Question false positive reading secondary to hemolysis with difficulty obtaining lab work yesterday. Repeat potassium level pending today.

## 2023-07-27 NOTE — Progress Notes (Signed)
Subjective:    Patient ID: Nancy [name], female    DOB: [birth year], 68 y.o.   MRN: [id number]  Leg Pain  Pertinent negatives include no numbness.    CHRYSTLE [name] is a very pleasant 68 y.o. female with a history of hypertension, CAD, alpha gal allergy, chronic neck pain, GAD, paresthesias, prediabetes, vitamin B12 deficiency who presents today to discuss lower extremity pain and recent lab work.  Her pain is located to the bilateral lower extremities which began around Christmas 2024.   Her right lower extremity pain begins to the right hip with radiation down to her right groin and sometimes down to the knee.  Her left lower extremity is more weak, not painful. Symptoms are worse after rising from a seated position. .   She does experience bilateral lower extremity cramping for which she suspects is secondary to Praluent. Her Praluent was discontinued yesterday, last dose was about 3 weeks ago. She began Praluent in January 2024. She experienced these same symptoms while on statin therapy  She believes that her symptoms have slowly improved this week,  she's able to stand up easier.   Evaluated by cardiology yesterday for same symptoms. Labs collected which revealed hyperkalemia with level of 5.5. She is not taking supplemental potassium, she does eat potassium rich foods. The lab technician had a hard time drawing labs yesterday. Had to throw away a few vials.   She denies numbness to her lower extremities, injury.trauma, pain to lower extremities with ambulation, back pain. Plan is to follow-up with the lipid clinic for further treatment of her hyperlipidemia and CAD.   Review of Systems  Musculoskeletal:  Positive for arthralgias and myalgias.  Neurological:  Positive for weakness. Negative for numbness.         Past Medical History:  Diagnosis Date   Allergy to alpha-gal    Breast cancer (HCC) 10/23/2020   CAD (coronary artery disease)    Chronic neck pain 04/11/2018    Coronary artery calcification seen on CAT scan    Eczema    Essential hypertension 07/20/2019   Fatigue    GAD (generalized anxiety disorder) 06/26/2018   Hyperlipidemia    Lichen sclerosus    Overweight (BMI 25.0-29.9) 01/09/2018   Recurrent upper respiratory infection (URI)    Routine general medical examination at a health care facility    Urinary frequency 06/06/2019   Urticaria    Vaginal itching 06/06/2019    Social History   Socioeconomic History   Marital status: Divorced    Spouse name: Not on file   Number of children: 2   Years of education: Not on file   Highest education level: 12th grade  Occupational History   Not on file  Tobacco Use   Smoking status: Never   Smokeless tobacco: Never  Vaping Use   Vaping status: Never Used  Substance and Sexual Activity   Alcohol use: Not Currently    Alcohol/week: 0.0 standard drinks of alcohol   Drug use: No   Sexual activity: Not on file  Other Topics Concern   Not on file  Social History Narrative   Divorced.  G3P2.   Regular exercise-yes   Social Drivers of Health   Financial Resource Strain: Low Risk  (01/05/2023)   Overall Financial Resource Strain (CARDIA)    Difficulty of Paying Living Expenses: Not hard at all  Food Insecurity: No Food Insecurity (01/05/2023)   Hunger Vital Sign    Worried About Running Out of Food  in the Last Year: Never true    Ran Out of Food in the Last Year: Never true  Transportation Needs: No Transportation Needs (01/05/2023)   PRAPARE - Administrator, Civil Service (Medical): No    Lack of Transportation (Non-Medical): No  Physical Activity: Insufficiently Active (01/05/2023)   Exercise Vital Sign    Days of Exercise per Week: 3 days    Minutes of Exercise per Session: 30 min  Stress: No Stress Concern Present (01/05/2023)   [employer] of Occupational Health - Occupational Stress Questionnaire    Feeling of Stress : Not at all  Social Connections: Moderately  Integrated (01/05/2023)   Social Connection and Isolation Panel [NHANES]    Frequency of Communication with Friends and Family: More than three times a week    Frequency of Social Gatherings with Friends and Family: More than three times a week    Attends Religious Services: More than 4 times per year    Active Member of Clubs or Organizations: Yes    Attends Banker Meetings: More than 4 times per year    Marital Status: Divorced  Intimate Partner Violence: Not At Risk (01/05/2023)   Humiliation, Afraid, Rape, and Kick questionnaire    Fear of Current or Ex-Partner: No    Emotionally Abused: No    Physically Abused: No    Sexually Abused: No    Past Surgical History:  Procedure Laterality Date   ANTERIOR CERVICAL DECOMP/DISCECTOMY FUSION     BREAST LUMPECTOMY WITH RADIOACTIVE SEED LOCALIZATION Left 11/14/2020   Procedure: LEFT BREAST LUMPECTOMY WITH RADIOACTIVE SEED LOCALIZATION;  Surgeon: Harriette Bouillon, MD;  Location: [facility];  Service: General;  Laterality: Left;   CESAREAN SECTION     1982 and 1983   COLONOSCOPY WITH PROPOFOL N/A 10/06/2020   Procedure: COLONOSCOPY WITH PROPOFOL;  Surgeon: Pasty Spillers, MD;  Location: [facility];  Service: Endoscopy;  Laterality: N/A;   CORONARY STENT INTERVENTION N/A 06/30/2022   Procedure: CORONARY STENT INTERVENTION;  Surgeon: Tonny Bollman, MD;  Location: [facility] INVASIVE CV LAB;  Service: Cardiovascular;  Laterality: N/A;   LEFT HEART CATH AND CORONARY ANGIOGRAPHY N/A 06/30/2022   Procedure: LEFT HEART CATH AND CORONARY ANGIOGRAPHY;  Surgeon: Tonny Bollman, MD;  Location: [facility] INVASIVE CV LAB;  Service: Cardiovascular;  Laterality: N/A;   PARTIAL HYSTERECTOMY     TUBAL LIGATION  1985    Family History  Problem Relation Age of Onset   Hydrocephalus Mother    Neuropathy Mother    Diabetes Father    Hypertension Father    Thyroid cancer Cousin    Thyroid disease Maternal Aunt     Allergies   Allergen Reactions   Alpha-Gal Anaphylaxis, Hives, Itching and Rash   Macrobid [Nitrofurantoin] Other (See Comments)    Hypoglycemia   Atorvastatin     Myalgias on 20mg  daily dosing   Lisinopril     Not allergy, intolerance: headaches   Milk Protein    Nitroglycerin     Not allergic, but needs premedication to use this as it contains bovine components (alpha gal allergy)   Other     Red Meats, Milk, Shellfish   Pravastatin     10-20mg  - myalgias   Repatha [Evolocumab]     Back aches, headache, sore throat   Rosuvastatin     Myalgias on 10mg  daily and 5mg  2x/week dosing   Shellfish Allergy    Simvastatin     Myalgias on 20mg  daily dosing  Statins Other (See Comments)    Myalgias    Zetia [Ezetimibe] Other (See Comments)    Myalgias    Current Outpatient Medications on File Prior to Visit  Medication Sig Dispense Refill   aspirin EC 81 MG tablet Take 1 tablet (81 mg total) by mouth daily.     Cholecalciferol (VITAMIN D-3 PO) Take 1 tablet by mouth 3 (three) times a week.     citalopram (CELEXA) 20 MG tablet TAKE 1 TABLET (20 MG TOTAL) BY MOUTH DAILY. FOR ANXIETY. 90 tablet 2   clobetasol cream (TEMOVATE) 0.05 % Apply 1 application  topically daily.     clopidogrel (PLAVIX) 75 MG tablet TAKE 1 TABLET BY MOUTH EVERY DAY WITH BREAKFAST 90 tablet 3   Cyanocobalamin (B-12) 1000 MCG CAPS Take 1 tablet by mouth 3 (three) times a week.     diphenhydrAMINE (BENADRYL) 25 MG tablet Take 1 tablet (25 mg total) by mouth every 6 (six) hours as needed (rash, itching). 15 tablet 0   EPINEPHrine 0.3 mg/0.3 mL IJ SOAJ injection INJECT 0.3 MG INTO THE MUSCLE AS NEEDED FOR ANAPHYLAXIS. 2 each 0   metoprolol succinate (TOPROL XL) 25 MG 24 hr tablet Take 0.5 tablets (12.5 mg total) by mouth daily. 45 tablet 3   nitroGLYCERIN (NITROLINGUAL) 0.4 MG/SPRAY spray Place 1 spray under the tongue every 5 (five) minutes as needed for chest pain. 12 g 2   No current facility-administered medications on  file prior to visit.    BP 136/70   Pulse 99   Temp (!) 97.2 F (36.2 C) (Temporal)   Ht 5\' 3"  (1.6 m)   Wt 159 lb (72.1 kg)   SpO2 (!) 65%   BMI 28.17 kg/m  Objective:   Physical Exam Cardiovascular:     Rate and Rhythm: Normal rate and regular rhythm.  Pulmonary:     Effort: Pulmonary effort is normal.     Breath sounds: Normal breath sounds.  Musculoskeletal:     Cervical back: Neck supple.     Right hip: Normal range of motion.     Left hip: Normal range of motion.     Comments: Mild pain to right groin with right hip flexion, right lateral and medial rotation.  5 out of 5 strength to bilateral lower extremities.  Able to climb up and down exam table without difficulty.  Skin:    General: Skin is warm and dry.  Neurological:     Mental Status: She is alert and oriented to person, place, and time.  Psychiatric:        Mood and Affect: Mood normal.           Assessment & Plan:  Lower extremity pain, bilateral Assessment & Plan: Symptoms not quite suggestive of claudication.  As symptoms feel similar to her prior statin therapy, it is reasonable to conclude that Praluent is contributing. 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  Plain films of the right hip ordered and pending.  Remain off Praluent. Repeat potassium level pending.   Hyperkalemia Assessment & Plan: Question false positive reading secondary to hemolysis with difficulty obtaining lab work yesterday. Repeat potassium level pending today.  Orders: -     Potassium  Acute pain of right hip Assessment & Plan: Plan films of the right hip ordered and pending.    Remain off Praluent.  Orders: -     DG HIP UNILAT W OR W/O PELVIS 2-3 VIEWS RIGHT  Doreene Nest, NP

## 2023-07-27 NOTE — Telephone Encounter (Signed)
Called and spoke with patient and provided Dr Harvie Bridge comments on plavix monotherapy. She states that she wants to stop plavix due to having multiple bruises, blood blisters on her lips, and constipation issues sometimes complicated with hemorrhoids. She verbalized understanding of the importance of maintaining stent patency and will remain on Asa 81mg  daily.

## 2023-07-27 NOTE — Assessment & Plan Note (Signed)
Plan films of the right hip ordered and pending.    Remain off Praluent.

## 2023-08-26 ENCOUNTER — Other Ambulatory Visit: Payer: Self-pay | Admitting: Primary Care

## 2023-08-26 DIAGNOSIS — F411 Generalized anxiety disorder: Secondary | ICD-10-CM

## 2023-09-01 ENCOUNTER — Ambulatory Visit (INDEPENDENT_AMBULATORY_CARE_PROVIDER_SITE_OTHER): Payer: PPO | Admitting: Primary Care

## 2023-09-01 ENCOUNTER — Encounter: Payer: Self-pay | Admitting: Primary Care

## 2023-09-01 VITALS — BP 126/80 | HR 64 | Temp 97.4°F | Ht 63.0 in | Wt 160.0 lb

## 2023-09-01 DIAGNOSIS — F411 Generalized anxiety disorder: Secondary | ICD-10-CM

## 2023-09-01 DIAGNOSIS — Z1231 Encounter for screening mammogram for malignant neoplasm of breast: Secondary | ICD-10-CM

## 2023-09-01 DIAGNOSIS — I10 Essential (primary) hypertension: Secondary | ICD-10-CM | POA: Diagnosis not present

## 2023-09-01 DIAGNOSIS — I2510 Atherosclerotic heart disease of native coronary artery without angina pectoris: Secondary | ICD-10-CM

## 2023-09-01 DIAGNOSIS — E782 Mixed hyperlipidemia: Secondary | ICD-10-CM | POA: Diagnosis not present

## 2023-09-01 DIAGNOSIS — M1611 Unilateral primary osteoarthritis, right hip: Secondary | ICD-10-CM | POA: Diagnosis not present

## 2023-09-01 DIAGNOSIS — R7303 Prediabetes: Secondary | ICD-10-CM | POA: Diagnosis not present

## 2023-09-01 DIAGNOSIS — K635 Polyp of colon: Secondary | ICD-10-CM

## 2023-09-01 DIAGNOSIS — Z0000 Encounter for general adult medical examination without abnormal findings: Secondary | ICD-10-CM | POA: Diagnosis not present

## 2023-09-01 DIAGNOSIS — Z1211 Encounter for screening for malignant neoplasm of colon: Secondary | ICD-10-CM

## 2023-09-01 DIAGNOSIS — L900 Lichen sclerosus et atrophicus: Secondary | ICD-10-CM | POA: Diagnosis not present

## 2023-09-01 DIAGNOSIS — Z91018 Allergy to other foods: Secondary | ICD-10-CM | POA: Diagnosis not present

## 2023-09-01 DIAGNOSIS — R195 Other fecal abnormalities: Secondary | ICD-10-CM | POA: Diagnosis not present

## 2023-09-01 LAB — HEPATIC FUNCTION PANEL
ALT: 11 U/L (ref 0–35)
AST: 16 U/L (ref 0–37)
Albumin: 4.3 g/dL (ref 3.5–5.2)
Alkaline Phosphatase: 71 U/L (ref 39–117)
Bilirubin, Direct: 0.1 mg/dL (ref 0.0–0.3)
Total Bilirubin: 0.6 mg/dL (ref 0.2–1.2)
Total Protein: 6.8 g/dL (ref 6.0–8.3)

## 2023-09-01 LAB — HEMOGLOBIN A1C: Hgb A1c MFr Bld: 6 % (ref 4.6–6.5)

## 2023-09-01 NOTE — Assessment & Plan Note (Signed)
 Immunizations UTD. Mammogram due in May, orders placed. Colonoscopy due, referral placed to GI  Discussed the importance of a healthy diet and regular exercise in order for weight loss, and to reduce the risk of further co-morbidity.  Exam stable. Labs pending.  Follow up in 1 year for repeat physical.

## 2023-09-01 NOTE — Assessment & Plan Note (Signed)
 Repeat A1c pending. Encouraged to increase physical activity.

## 2023-09-01 NOTE — Progress Notes (Signed)
 Subjective:    Patient ID: [name], female    DOB: 06/24/1955, 68 y.o.   MRN: [id number]  HPI  ZONA PEDRO is a very pleasant 68 y.o. female who presents today for complete physical and follow up of chronic conditions.  Immunizations: -Tetanus: Completed in 2017 -Shingles: Never Completed  -Pneumonia: Completed Prevnar 13 in 2022 and pneumovax 23 in 2023  Diet: Fair diet.  Exercise: No regular exercise.  Eye exam: Completes annually  Dental exam: Completes semi-annually    Mammogram: Completed in May 2024 Bone Density Scan: Completed in August 2024  Colonoscopy: Completed in 2022, due April 2023 and did not complete.   BP Readings from Last 3 Encounters:  09/01/23 126/80  07/27/23 136/70  07/26/23 120/78        Review of Systems  Constitutional:  Negative for unexpected weight change.  HENT:  Negative for rhinorrhea.   Respiratory:  Negative for cough and shortness of breath.   Cardiovascular:  Negative for chest pain.  Gastrointestinal:  Negative for constipation and diarrhea.  Genitourinary:  Negative for difficulty urinating.  Musculoskeletal:  Positive for arthralgias. Negative for myalgias.  Skin:  Negative for rash.  Allergic/Immunologic: Negative for environmental allergies.  Neurological:  Negative for dizziness and headaches.  Psychiatric/Behavioral:  The patient is not nervous/anxious.          Past Medical History:  Diagnosis Date   Allergy to alpha-gal    Breast cancer (HCC) 10/23/2020   CAD (coronary artery disease)    Chronic neck pain 04/11/2018   Coronary artery calcification seen on CAT scan    Eczema    Essential hypertension 07/20/2019   Fatigue    GAD (generalized anxiety disorder) 06/26/2018   Hyperkalemia 07/27/2023   Hyperlipidemia    Lichen sclerosus    Overweight (BMI 25.0-29.9) 01/09/2018   Recurrent upper respiratory infection (URI)    Routine general medical examination at a health care facility    Tick bite  of left shoulder 09/29/2021   Urinary frequency 06/06/2019   Urticaria    Vaginal itching 06/06/2019    Social History   Socioeconomic History   Marital status: Divorced    Spouse name: Not on file   Number of children: 2   Years of education: Not on file   Highest education level: 12th grade  Occupational History   Not on file  Tobacco Use   Smoking status: Never   Smokeless tobacco: Never  Vaping Use   Vaping status: Never Used  Substance and Sexual Activity   Alcohol use: Not Currently    Alcohol/week: 0.0 standard drinks of alcohol   Drug use: No   Sexual activity: Not on file  Other Topics Concern   Not on file  Social History Narrative   Divorced.  G3P2.   Regular exercise-yes   Social Drivers of Health   Financial Resource Strain: Low Risk  (08/30/2023)   Overall Financial Resource Strain (CARDIA)    Difficulty of Paying Living Expenses: Not hard at all  Food Insecurity: No Food Insecurity (08/30/2023)   Hunger Vital Sign    Worried About Running Out of Food in the Last Year: Never true    Ran Out of Food in the Last Year: Never true  Transportation Needs: No Transportation Needs (08/30/2023)   PRAPARE - Administrator, Civil Service (Medical): No    Lack of Transportation (Non-Medical): No  Physical Activity: Inactive (08/30/2023)   Exercise Vital Sign  Days of Exercise per Week: 0 days    Minutes of Exercise per Session: 30 min  Stress: No Stress Concern Present (08/30/2023)   [employer] of Occupational Health - Occupational Stress Questionnaire    Feeling of Stress : Not at all  Social Connections: Unknown (08/30/2023)   Social Connection and Isolation Panel [NHANES]    Frequency of Communication with Friends and Family: Three times a week    Frequency of Social Gatherings with Friends and Family: More than three times a week    Attends Religious Services: Patient declined    Active Member of Clubs or Organizations: Yes    Attends Occupational hygienist Meetings: More than 4 times per year    Marital Status: Divorced  Intimate Partner Violence: Not At Risk (01/05/2023)   Humiliation, Afraid, Rape, and Kick questionnaire    Fear of Current or Ex-Partner: No    Emotionally Abused: No    Physically Abused: No    Sexually Abused: No    Past Surgical History:  Procedure Laterality Date   ANTERIOR CERVICAL DECOMP/DISCECTOMY FUSION     BREAST LUMPECTOMY WITH RADIOACTIVE SEED LOCALIZATION Left 11/14/2020   Procedure: LEFT BREAST LUMPECTOMY WITH RADIOACTIVE SEED LOCALIZATION;  Surgeon: Harriette Bouillon, MD;  Location: [facility];  Service: General;  Laterality: Left;   CESAREAN SECTION     1982 and 1983   COLONOSCOPY WITH PROPOFOL N/A 10/06/2020   Procedure: COLONOSCOPY WITH PROPOFOL;  Surgeon: Pasty Spillers, MD;  Location: [facility];  Service: Endoscopy;  Laterality: N/A;   CORONARY STENT INTERVENTION N/A 06/30/2022   Procedure: CORONARY STENT INTERVENTION;  Surgeon: Tonny Bollman, MD;  Location: [facility] INVASIVE CV LAB;  Service: Cardiovascular;  Laterality: N/A;   LEFT HEART CATH AND CORONARY ANGIOGRAPHY N/A 06/30/2022   Procedure: LEFT HEART CATH AND CORONARY ANGIOGRAPHY;  Surgeon: Tonny Bollman, MD;  Location: [facility] INVASIVE CV LAB;  Service: Cardiovascular;  Laterality: N/A;   PARTIAL HYSTERECTOMY     TUBAL LIGATION  1985    Family History  Problem Relation Age of Onset   Hydrocephalus Mother    Neuropathy Mother    Diabetes Father    Hypertension Father    Thyroid cancer Cousin    Thyroid disease Maternal Aunt     Allergies  Allergen Reactions   Alpha-Gal Anaphylaxis, Hives, Itching and Rash   Macrobid [Nitrofurantoin] Other (See Comments)    Hypoglycemia   Atorvastatin     Myalgias on 20mg  daily dosing   Lisinopril     Not allergy, intolerance: headaches   Milk Protein    Nitroglycerin     Not allergic, but needs premedication to use this as it contains bovine components (alpha gal  allergy)   Other     Red Meats, Milk, Shellfish   Pravastatin     10-20mg  - myalgias   Repatha [Evolocumab]     Back aches, headache, sore throat   Rosuvastatin     Myalgias on 10mg  daily and 5mg  2x/week dosing   Shellfish Allergy    Simvastatin     Myalgias on 20mg  daily dosing   Statins Other (See Comments)    Myalgias    Zetia [Ezetimibe] Other (See Comments)    Myalgias    Current Outpatient Medications on File Prior to Visit  Medication Sig Dispense Refill   aspirin EC 81 MG tablet Take 1 tablet (81 mg total) by mouth daily.     Cholecalciferol (VITAMIN D-3 PO) Take 1 tablet by mouth 3 (  three) times a week.     citalopram (CELEXA) 20 MG tablet TAKE 1 TABLET (20 MG TOTAL) BY MOUTH DAILY. FOR ANXIETY. 90 tablet 0   clobetasol cream (TEMOVATE) 0.05 % Apply 1 application  topically daily.     Cyanocobalamin (B-12) 1000 MCG CAPS Take 1 tablet by mouth 3 (three) times a week.     EPINEPHrine 0.3 mg/0.3 mL IJ SOAJ injection INJECT 0.3 MG INTO THE MUSCLE AS NEEDED FOR ANAPHYLAXIS. 2 each 0   metoprolol succinate (TOPROL XL) 25 MG 24 hr tablet Take 0.5 tablets (12.5 mg total) by mouth daily. 45 tablet 3   nitroGLYCERIN (NITROLINGUAL) 0.4 MG/SPRAY spray Place 1 spray under the tongue every 5 (five) minutes as needed for chest pain. 12 g 2   No current facility-administered medications on file prior to visit.    BP 126/80   Pulse 64   Temp (!) 97.4 F (36.3 C) (Temporal)   Ht 5\' 3"  (1.6 m)   Wt 160 lb (72.6 kg)   SpO2 98%   BMI 28.34 kg/m  Objective:   Physical Exam HENT:     Right Ear: Tympanic membrane and ear canal normal.     Left Ear: Tympanic membrane and ear canal normal.  Eyes:     Pupils: Pupils are equal, round, and reactive to light.  Cardiovascular:     Rate and Rhythm: Normal rate and regular rhythm.  Pulmonary:     Effort: Pulmonary effort is normal.     Breath sounds: Normal breath sounds.  Abdominal:     General: Bowel sounds are normal.      Palpations: Abdomen is soft.     Tenderness: There is no abdominal tenderness.  Musculoskeletal:        General: Normal range of motion.     Cervical back: Neck supple.  Skin:    General: Skin is warm and dry.  Neurological:     Mental Status: She is alert and oriented to person, place, and time.     Cranial Nerves: No cranial nerve deficit.     Deep Tendon Reflexes:     Reflex Scores:      Patellar reflexes are 2+ on the right side and 2+ on the left side. Psychiatric:        Mood and Affect: Mood normal.           Assessment & Plan:  Preventative health care Assessment & Plan: Immunizations UTD. Mammogram due in May, orders placed. Colonoscopy due, referral placed to GI  Discussed the importance of a healthy diet and regular exercise in order for weight loss, and to reduce the risk of further co-morbidity.  Exam stable. Labs pending.  Follow up in 1 year for repeat physical.    Lichen sclerosus Assessment & Plan: Stable.  Continue clobetasol 0.05% cream PRN. Follows with GYN.   Screening mammogram for breast cancer -     3D Screening Mammogram, Left and Right; Future  Screening for colon cancer -     Ambulatory referral to Gastroenterology  Polyp of sigmoid colon, unspecified type -     Ambulatory referral to Gastroenterology  Polyp of transverse colon, unspecified type -     Ambulatory referral to Gastroenterology  Positive colorectal cancer screening using Cologuard test -     Ambulatory referral to Gastroenterology  Coronary artery disease involving native coronary artery of native heart without angina pectoris Assessment & Plan: Asymptomatic.  Continue to work on lipid clinic on lipid control. Continue  aspirin 81 mg daily.    Essential hypertension Assessment & Plan: Controlled.   Continue metoprolol succinate 12.5 mg daily.    Primary osteoarthritis of right hip Assessment & Plan: Reviewed xrays from February 2025. She declines  orthopedic or PT referral at this time but will notify if she changes her mind.   Allergy to alpha-gal Assessment & Plan: Repeat alpha gal levels pending. Continue to avoid mammalian meat and some associated products.  Orders: -     Alpha-Gal Panel  GAD (generalized anxiety disorder) Assessment & Plan: Controlled.  Continue citalopram 20 mg daily.   Mixed hyperlipidemia Assessment & Plan: Working with lipid clinic. Remain off Praluent given side effects.   Prediabetes Assessment & Plan: Repeat A1c pending. Encouraged to increase physical activity.  Orders: -     Hepatic function panel -     Hemoglobin A1c        Doreene Nest, NP

## 2023-09-01 NOTE — Assessment & Plan Note (Signed)
 Reviewed xrays from February 2025. She declines orthopedic or PT referral at this time but will notify if she changes her mind.

## 2023-09-01 NOTE — Assessment & Plan Note (Signed)
 Stable.  Continue clobetasol 0.05% cream PRN. Follows with GYN.

## 2023-09-01 NOTE — Assessment & Plan Note (Signed)
 Working with lipid clinic. Remain off Praluent given side effects.

## 2023-09-01 NOTE — Assessment & Plan Note (Signed)
Controlled.  Continue citalopram 20 mg daily.

## 2023-09-01 NOTE — Assessment & Plan Note (Signed)
 Repeat alpha gal levels pending. Continue to avoid mammalian meat and some associated products.

## 2023-09-01 NOTE — Assessment & Plan Note (Signed)
Controlled.  Continue metoprolol succinate 12.5 mg daily.

## 2023-09-01 NOTE — Patient Instructions (Signed)
Stop by the lab prior to leaving today. I will notify you of your results once received.   [facility] to schedule your mamm[facility].   You will either be contacted via phone regarding your referral to GI for the colonoscopy, or you may receive a letter on your MyChart portal from our referral team with instructions for scheduling an appointment. Please let us know if you have not been contacted by anyone within two weeks.  It was a pleasure to see you today!

## 2023-09-01 NOTE — Assessment & Plan Note (Signed)
 Asymptomatic.  Continue to work on lipid clinic on lipid control. Continue aspirin 81 mg daily.

## 2023-09-04 LAB — ALPHA-GAL PANEL
Allergen, Mutton, f88: 29.5 kU/L — ABNORMAL HIGH
Allergen, Pork, f26: 21.8 kU/L — ABNORMAL HIGH
Beef: 44.8 kU/L — ABNORMAL HIGH
CLASS: 4
CLASS: 4
Class: 4
GALACTOSE-ALPHA-1,3-GALACTOSE IGE*: 55.1 kU/L — ABNORMAL HIGH (ref <0.10)

## 2023-09-04 LAB — INTERPRETATION:

## 2023-09-20 ENCOUNTER — Ambulatory Visit: Payer: PPO

## 2023-10-13 ENCOUNTER — Encounter: Payer: Self-pay | Admitting: Pharmacist

## 2023-10-13 ENCOUNTER — Ambulatory Visit

## 2023-10-13 ENCOUNTER — Ambulatory Visit: Attending: Cardiology | Admitting: Pharmacist

## 2023-10-13 VITALS — Ht 63.0 in | Wt 156.0 lb

## 2023-10-13 DIAGNOSIS — Z860100 Personal history of colon polyps, unspecified: Secondary | ICD-10-CM

## 2023-10-13 DIAGNOSIS — E7849 Other hyperlipidemia: Secondary | ICD-10-CM

## 2023-10-13 NOTE — Patient Instructions (Addendum)
 Your Results:             Your most recent labs Goal  Total Cholesterol 214 < 200  Triglycerides 124 < 150  HDL (happy/good cholesterol) 78 > 40  LDL (lousy/bad cholesterol 115 < 70   Medication discussed today are as follow   [email or web address]  LEQVIO injection is indicated as an adjunct to diet and statin therapy for the treatment of adults with primary hyperlipidemia, including heterozygous familial hypercholesterolemia (HeFH), to reduce low-density lipoprotein cholesterol (LDL-C). erious hypersensitivity reactions have included angioedema. Adverse reactions in clinical trials (>=3% of patients treated with LEQVIO and more frequently than placebo) were injection site reaction, arthralgia, and bronchitis.  Nexletol:  visit - [email or web address] for more info

## 2023-10-13 NOTE — Progress Notes (Signed)
 Patient ID: Nancy [name]                 DOB: [birth year]                    MRN: [id number]      HPI: Nancy [name] is a 68 y.o. female patient referred to lipid clinic by Reesa Cannon, PA-C. PMH is significant for CAD, angina, HTN, HLD, osteoarthritis, GAD.    Patient presented today for lipid clinic. We discussed previous intolerances for cholesterol medications in detail. She is intolerance to mammalian meat  and potentially milk too as it is derived mostly from mammals.   Reviewed options for lowering LDL cholesterol, including bempedoic acid and inclisiran.  Discussed mechanisms of action, dosing, side effects and potential decreases in LDL cholesterol.  Also reviewed cost information and potential options for patient assistance.  Current Medications: none  Intolerances: Repatha  - Back aches, headache, sore throat , simvastatin , atorvastatin , Crestor  -Myalgias on 10mg  daily and 5mg  2x/week dosing, pravastatin  - 10-20mg  - myalgias, Praluent  - myalgia - tried twice, Zetia  10 mg - myalgia  Risk Factors: CAD, angina, HTN, HLD, osteoarthritis, GAD.   LDL goal: <70 mg/dl  Last lab: LDL 409, HDL. 78, TG 124, TC 214 - Feb 2025   Diet: chicken- roasted, grilled  Mainly healthy eating  Eat out - 3 meals per week  Drink: diet soda, water   Snack: Apple and potato chips sometimes   Exercise: stay active around the house and started walking 30 min 3-4 times per week   Family History:  Relation Problem Comments  Mother (Deceased) Hydrocephalus   Neuropathy     Father (Deceased) Diabetes   Hypertension     Maternal Aunt Thyroid  disease     Cousin Thyroid  cancer     Social History:  Alcohol: none  Smoking: none  Labs:  Lipid Panel     Component Value Date/Time   CHOL 214 (H) 07/26/2023 1119   TRIG 124 07/26/2023 1119   HDL 78 07/26/2023 1119   CHOLHDL 2.7 07/26/2023 1119   CHOLHDL 3 08/31/2022 1042   VLDL 19.4 08/31/2022 1042   LDLCALC 115 (H) 07/26/2023 1119   LDLDIRECT  197.5 11/02/2011 0819   LABVLDL 21 07/26/2023 1119    Past Medical History:  Diagnosis Date   Allergy to alpha-gal    Breast cancer (HCC) 10/23/2020   CAD (coronary artery disease)    Chronic neck pain 04/11/2018   Coronary artery calcification seen on CAT scan    Eczema    Essential hypertension 07/20/2019   Fatigue    GAD (generalized anxiety disorder) 06/26/2018   Hyperkalemia 07/27/2023   Hyperlipidemia    Lichen sclerosus    Overweight (BMI 25.0-29.9) 01/09/2018   Recurrent upper respiratory infection (URI)    Routine general medical examination at a health care facility    Tick bite of left shoulder 09/29/2021   Urinary frequency 06/06/2019   Urticaria    Vaginal itching 06/06/2019    Current Outpatient Medications on File Prior to Visit  Medication Sig Dispense Refill   aspirin  EC 81 MG tablet Take 1 tablet (81 mg total) by mouth daily.     Cholecalciferol (VITAMIN D-3 PO) Take 1 tablet by mouth 3 (three) times a week.     citalopram  (CELEXA ) 20 MG tablet TAKE 1 TABLET (20 MG TOTAL) BY MOUTH DAILY. FOR ANXIETY. 90 tablet 0   clobetasol cream (TEMOVATE) 0.05 % Apply 1 application  topically  daily.     Cyanocobalamin  (B-12) 1000 MCG CAPS Take 1 tablet by mouth 3 (three) times a week.     EPINEPHrine  0.3 mg/0.3 mL IJ SOAJ injection INJECT 0.3 MG INTO THE MUSCLE AS NEEDED FOR ANAPHYLAXIS. 2 each 0   metoprolol  succinate (TOPROL  XL) 25 MG 24 hr tablet Take 0.5 tablets (12.5 mg total) by mouth daily. 45 tablet 3   nitroGLYCERIN  (NITROLINGUAL ) 0.4 MG/SPRAY spray Place 1 spray under the tongue every 5 (five) minutes as needed for chest pain. 12 g 2   No current facility-administered medications on file prior to visit.    Allergies  Allergen Reactions   Alpha-Gal Anaphylaxis, Hives, Itching and Rash   Other Other (See Comments)    Red Meats, Milk, Shellfish   Macrobid  [Nitrofurantoin ] Other (See Comments)    Hypoglycemia   Pravastatin  Other (See Comments)    10-20mg  -  myalgias   Repatha  [Evolocumab ] Other (See Comments)    Back aches, headache, sore throat   Rosuvastatin  Other (See Comments)    Myalgias on 10mg  daily and 5mg  2x/week dosing   Shellfish Allergy Other (See Comments)    Alph gal    Simvastatin  Other (See Comments)    Myalgias on 20mg  daily dosing   Atorvastatin  Other (See Comments)    Myalgias on 20mg  daily dosing   Statins Other (See Comments)    Myalgias    Lisinopril  Other (See Comments)    Not allergy, intolerance: headaches   Milk Protein Other (See Comments)    Allergy testing   Nitroglycerin  Other (See Comments)    Not allergic, but needs premedication to use this as it contains bovine components (alpha gal allergy)   Zetia  [Ezetimibe ] Other (See Comments)    Myalgias    Assessment/Plan:  1. Hyperlipidemia -  Assessment and Plan:  Current Medications: none  Intolerances: Repatha  - Back aches, headache, sore throat , simvastatin , atorvastatin , Crestor  -Myalgias on 10mg  daily and 5mg  2x/week dosing, pravastatin  - 10-20mg  - myalgias, Praluent  - myalgia - tried twice, Zetia  10 mg - myalgia  Risk Factors: CAD, angina, HTN, HLD, osteoarthritis, GAD.   LDL goal: <70 mg/dl  Last lab: LDL 841, HDL. 78, TG 124, TC 214 - Feb 2025   Last LDL above the goal and patient intolerance to multiple statins and non statins - only available options for her is Leqvio and bempedoic acid. We discussed those two options in detail. Patient wants to think about them will get back to me in 2 weeks     Thank you,  Nickola Baron, Pharm.D [facility] of [facility] [address] [place], [place] [address]  Phone: [phone]; Fax: [phone]

## 2023-10-13 NOTE — Progress Notes (Signed)
 No egg or soy allergy known to patient   No issues known to pt with past sedation with any surgeries or procedures Patient denies ever being told they had issues or difficulty with intubation  No FH of Malignant Hyperthermia Pt is not on diet pills Pt is not on  home 02  Pt is not on blood thinners  Pt has some issues with constipation, extra Miralax instructions provided No A fib or A flutter Have any cardiac testing pending--No Pt can ambulate  Pt denies use of chewing tobacco Discussed diabetic I weight loss medication holds Discussed NSAID holds Checked BMI Pt instructed to use Singlecare.com or GoodRx for a price reduction on prep (Pt requested Miralax prep)  Patient's chart reviewed by Rogena Class CNRA prior to previsit and patient appropriate for the [facility].  Pre visit completed and red dot placed by patient's name on their procedure day (on provider's schedule).

## 2023-10-13 NOTE — Assessment & Plan Note (Signed)
 Last LDL above the goal and patient intolerance to multiple statins and non statins - only available options for her is Leqvio and bempedoic acid. We discussed those two options in detail. Patient wants to think about them will get back to me in 2 weeks

## 2023-10-24 ENCOUNTER — Encounter (HOSPITAL_COMMUNITY): Payer: Self-pay

## 2023-10-25 DIAGNOSIS — N760 Acute vaginitis: Secondary | ICD-10-CM | POA: Diagnosis not present

## 2023-10-25 DIAGNOSIS — Z01411 Encounter for gynecological examination (general) (routine) with abnormal findings: Secondary | ICD-10-CM | POA: Diagnosis not present

## 2023-10-25 DIAGNOSIS — L900 Lichen sclerosus et atrophicus: Secondary | ICD-10-CM | POA: Diagnosis not present

## 2023-10-31 ENCOUNTER — Ambulatory Visit
Admission: RE | Admit: 2023-10-31 | Discharge: 2023-10-31 | Disposition: A | Discharge status: Home / Self Care | Source: Ambulatory Visit | Attending: Primary Care | Admitting: Primary Care

## 2023-10-31 DIAGNOSIS — Z1231 Encounter for screening mammogram for malignant neoplasm of breast: Secondary | ICD-10-CM

## 2023-11-01 ENCOUNTER — Encounter: Payer: Self-pay | Admitting: Gastroenterology

## 2023-11-01 ENCOUNTER — Ambulatory Visit (AMBULATORY_SURGERY_CENTER): Admitting: Gastroenterology

## 2023-11-01 VITALS — BP 109/64 | HR 61 | Temp 97.3°F | Resp 12 | Ht 63.0 in | Wt 156.0 lb

## 2023-11-01 DIAGNOSIS — F411 Generalized anxiety disorder: Secondary | ICD-10-CM | POA: Diagnosis not present

## 2023-11-01 DIAGNOSIS — Z860100 Personal history of colon polyps, unspecified: Secondary | ICD-10-CM

## 2023-11-01 DIAGNOSIS — K641 Second degree hemorrhoids: Secondary | ICD-10-CM | POA: Diagnosis not present

## 2023-11-01 DIAGNOSIS — Z860101 Personal history of adenomatous and serrated colon polyps: Secondary | ICD-10-CM | POA: Diagnosis not present

## 2023-11-01 DIAGNOSIS — K5730 Diverticulosis of large intestine without perforation or abscess without bleeding: Secondary | ICD-10-CM | POA: Diagnosis not present

## 2023-11-01 DIAGNOSIS — Z1211 Encounter for screening for malignant neoplasm of colon: Secondary | ICD-10-CM | POA: Diagnosis not present

## 2023-11-01 DIAGNOSIS — I2510 Atherosclerotic heart disease of native coronary artery without angina pectoris: Secondary | ICD-10-CM | POA: Diagnosis not present

## 2023-11-01 DIAGNOSIS — K644 Residual hemorrhoidal skin tags: Secondary | ICD-10-CM | POA: Diagnosis not present

## 2023-11-01 DIAGNOSIS — I10 Essential (primary) hypertension: Secondary | ICD-10-CM | POA: Diagnosis not present

## 2023-11-01 MED ORDER — SODIUM CHLORIDE 0.9 % IV SOLN: 500.0000 mL | Freq: Once | INTRAVENOUS | Status: DC

## 2023-11-01 NOTE — Patient Instructions (Signed)
 High fiber diet Use FiberCon 1-2 tablets daily Continue present medications    YOU HAD AN ENDOSCOPIC PROCEDURE TODAY AT THE [facility]:   Refer to the procedure report that was given to you for any specific questions about what was found during the examination.  If the procedure report does not answer your questions, please call your gastroenterologist to clarify.  If you requested that your care partner not be given the details of your procedure findings, then the procedure report has been included in a sealed envelope for you to review at your convenience later.  YOU SHOULD EXPECT: Some feelings of bloating in the abdomen. Passage of more gas than usual.  Walking can help get rid of the air that was put into your GI tract during the procedure and reduce the bloating. If you had a lower endoscopy (such as a colonoscopy or flexible sigmoidoscopy) you may notice spotting of blood in your stool or on the toilet paper. If you underwent a bowel prep for your procedure, you may not have a normal bowel movement for a few days.  Please Note:  You might notice some irritation and congestion in your nose or some drainage.  This is from the oxygen used during your procedure.  There is no need for concern and it should clear up in a day or so.  SYMPTOMS TO REPORT IMMEDIATELY:  Following lower endoscopy (colonoscopy or flexible sigmoidoscopy):  Excessive amounts of blood in the stool  Significant tenderness or worsening of abdominal pains  Swelling of the abdomen that is new, acute  Fever of 100F or higher   For urgent or emergent issues, a gastroenterologist can be reached at any hour by calling [phone]. Do not use MyChart messaging for urgent concerns.    DIET:  We do recommend a small meal at first, but then you may proceed to your regular diet.  Drink plenty of fluids but you should avoid alcoholic beverages for 24 hours.  ACTIVITY:  You should plan to take it easy for the  rest of today and you should NOT DRIVE or use heavy machinery until tomorrow (because of the sedation medicines used during the test).    FOLLOW UP: Our staff will call the number listed on your records the next business day following your procedure.  We will call around 7:15- 8:00 am to check on you and address any questions or concerns that you may have regarding the information given to you following your procedure. If we do not reach you, we will leave a message.     If any biopsies were taken you will be contacted by phone or by letter within the next 1-3 weeks.  Please call us  at [phone] if you have not heard about the biopsies in 3 weeks.    SIGNATURES/CONFIDENTIALITY: You and/or your care partner have signed paperwork which will be entered into your electronic medical record.  These signatures attest to the fact that that the information above on your After Visit Summary has been reviewed and is understood.  Full responsibility of the confidentiality of this discharge information lies with you and/or your care-partner.

## 2023-11-01 NOTE — Progress Notes (Signed)
 GASTROENTEROLOGY PROCEDURE H&P NOTE   Primary Care Physician: Yahya Boldman John, NP  HPI: Nancy [name] is a 68 y.o. female who presents for Colonoscopy for surveillance of previous adenomas and advanced polyps.  Past Medical History:  Diagnosis Date   Allergy to alpha-gal    Breast cancer (HCC) 10/23/2020   CAD (coronary artery disease)    Chronic neck pain 04/11/2018   Coronary artery calcification seen on CAT scan    Eczema    Essential hypertension 07/20/2019   Fatigue    GAD (generalized anxiety disorder) 06/26/2018   Hyperkalemia 07/27/2023   Hyperlipidemia    Lichen sclerosus    Overweight (BMI 25.0-29.9) 01/09/2018   Recurrent upper respiratory infection (URI)    Routine general medical examination at a health care facility    Tick bite of left shoulder 09/29/2021   Urinary frequency 06/06/2019   Urticaria    Vaginal itching 06/06/2019   Past Surgical History:  Procedure Laterality Date   ANTERIOR CERVICAL DECOMP/DISCECTOMY FUSION     BREAST LUMPECTOMY WITH RADIOACTIVE SEED LOCALIZATION Left 11/14/2020   Procedure: LEFT BREAST LUMPECTOMY WITH RADIOACTIVE SEED LOCALIZATION;  Surgeon: Sim Dryer, MD;  Location: [facility];  Service: General;  Laterality: Left;   CESAREAN SECTION     1982 and 1983   COLONOSCOPY WITH PROPOFOL  N/A 10/06/2020   Procedure: COLONOSCOPY WITH PROPOFOL ;  Surgeon: Irby Mannan, MD;  Location: [facility];  Service: Endoscopy;  Laterality: N/A;   CORONARY STENT INTERVENTION N/A 06/30/2022   Procedure: CORONARY STENT INTERVENTION;  Surgeon: Arnoldo Lapping, MD;  Location: [facility] INVASIVE CV LAB;  Service: Cardiovascular;  Laterality: N/A;   LEFT HEART CATH AND CORONARY ANGIOGRAPHY N/A 06/30/2022   Procedure: LEFT HEART CATH AND CORONARY ANGIOGRAPHY;  Surgeon: Arnoldo Lapping, MD;  Location: [facility] INVASIVE CV LAB;  Service: Cardiovascular;  Laterality: N/A;   PARTIAL HYSTERECTOMY     TUBAL LIGATION  1985   Current  Outpatient Medications  Medication Sig Dispense Refill   aspirin  EC 81 MG tablet Take 1 tablet (81 mg total) by mouth daily.     Cholecalciferol (VITAMIN D-3 PO) Take 1 tablet by mouth 3 (three) times a week.     citalopram  (CELEXA ) 20 MG tablet TAKE 1 TABLET (20 MG TOTAL) BY MOUTH DAILY. FOR ANXIETY. 90 tablet 0   clobetasol cream (TEMOVATE) 0.05 % Apply 1 application  topically daily.     Cyanocobalamin  (B-12) 1000 MCG CAPS Take 1 tablet by mouth 3 (three) times a week.     EPINEPHrine  0.3 mg/0.3 mL IJ SOAJ injection INJECT 0.3 MG INTO THE MUSCLE AS NEEDED FOR ANAPHYLAXIS. 2 each 0   metoprolol  succinate (TOPROL  XL) 25 MG 24 hr tablet Take 0.5 tablets (12.5 mg total) by mouth daily. 45 tablet 3   nitroGLYCERIN  (NITROLINGUAL ) 0.4 MG/SPRAY spray Place 1 spray under the tongue every 5 (five) minutes as needed for chest pain. 12 g 2   No current facility-administered medications for this visit.    Current Outpatient Medications:    aspirin  EC 81 MG tablet, Take 1 tablet (81 mg total) by mouth daily., Disp: , Rfl:    Cholecalciferol (VITAMIN D-3 PO), Take 1 tablet by mouth 3 (three) times a week., Disp: , Rfl:    citalopram  (CELEXA ) 20 MG tablet, TAKE 1 TABLET (20 MG TOTAL) BY MOUTH DAILY. FOR ANXIETY., Disp: 90 tablet, Rfl: 0   clobetasol cream (TEMOVATE) 0.05 %, Apply 1 application  topically daily., Disp: , Rfl:  Cyanocobalamin  (B-12) 1000 MCG CAPS, Take 1 tablet by mouth 3 (three) times a week., Disp: , Rfl:    EPINEPHrine  0.3 mg/0.3 mL IJ SOAJ injection, INJECT 0.3 MG INTO THE MUSCLE AS NEEDED FOR ANAPHYLAXIS., Disp: 2 each, Rfl: 0   metoprolol  succinate (TOPROL  XL) 25 MG 24 hr tablet, Take 0.5 tablets (12.5 mg total) by mouth daily., Disp: 45 tablet, Rfl: 3   nitroGLYCERIN  (NITROLINGUAL ) 0.4 MG/SPRAY spray, Place 1 spray under the tongue every 5 (five) minutes as needed for chest pain., Disp: 12 g, Rfl: 2 Allergies  Allergen Reactions   Alpha-Gal Anaphylaxis, Hives, Itching and Rash    Other Other (See Comments)    Red Meats, Milk, Shellfish   Macrobid  [Nitrofurantoin ] Other (See Comments)    Hypoglycemia   Pravastatin  Other (See Comments)    10-20mg  - myalgias   Repatha  Fennec.Footman ] Other (See Comments)    Back aches, headache, sore throat   Rosuvastatin  Other (See Comments)    Myalgias on 10mg  daily and 5mg  2x/week dosing   Shellfish Allergy Other (See Comments)    Alph gal    Simvastatin  Other (See Comments)    Myalgias on 20mg  daily dosing   Atorvastatin  Other (See Comments)    Myalgias on 20mg  daily dosing   Statins Other (See Comments)    Myalgias    Lisinopril  Other (See Comments)    Not allergy, intolerance: headaches   Milk Protein Other (See Comments)    Allergy testing   Nitroglycerin  Other (See Comments)    Not allergic, but needs premedication to use this as it contains bovine components (alpha gal allergy)   Zetia  [Ezetimibe ] Other (See Comments)    Myalgias   Family History  Problem Relation Age of Onset   Hydrocephalus Mother    Neuropathy Mother    Diabetes Father    Hypertension Father    Thyroid  disease Maternal Aunt    Thyroid  cancer Cousin    Colon cancer Neg Hx    Rectal cancer Neg Hx    Stomach cancer Neg Hx    Esophageal cancer Neg Hx    Social History   Socioeconomic History   Marital status: Divorced    Spouse name: Not on file   Number of children: 2   Years of education: Not on file   Highest education level: 12th grade  Occupational History   Not on file  Tobacco Use   Smoking status: Never   Smokeless tobacco: Never  Vaping Use   Vaping status: Never Used  Substance and Sexual Activity   Alcohol use: Not Currently    Alcohol/week: 0.0 standard drinks of alcohol   Drug use: No   Sexual activity: Not on file  Other Topics Concern   Not on file  Social History Narrative   Divorced.  G3P2.   Regular exercise-yes   Social Drivers of Health   Financial Resource Strain: Low Risk  (08/30/2023)   Overall  Financial Resource Strain (CARDIA)    Difficulty of Paying Living Expenses: Not hard at all  Food Insecurity: No Food Insecurity (08/30/2023)   Hunger Vital Sign    Worried About Running Out of Food in the Last Year: Never true    Ran Out of Food in the Last Year: Never true  Transportation Needs: No Transportation Needs (08/30/2023)   PRAPARE - Administrator, Civil Service (Medical): No    Lack of Transportation (Non-Medical): No  Physical Activity: Inactive (08/30/2023)   Exercise Vital Sign  Days of Exercise per Week: 0 days    Minutes of Exercise per Session: 30 min  Stress: No Stress Concern Present (08/30/2023)   [employer] of Occupational Health - Occupational Stress Questionnaire    Feeling of Stress : Not at all  Social Connections: Unknown (08/30/2023)   Social Connection and Isolation Panel [NHANES]    Frequency of Communication with Friends and Family: Three times a week    Frequency of Social Gatherings with Friends and Family: More than three times a week    Attends Religious Services: Patient declined    Active Member of Clubs or Organizations: Yes    Attends Engineer, structural: More than 4 times per year    Marital Status: Divorced  Intimate Partner Violence: Not At Risk (01/05/2023)   Humiliation, Afraid, Rape, and Kick questionnaire    Fear of Current or Ex-Partner: No    Emotionally Abused: No    Physically Abused: No    Sexually Abused: No    Physical Exam: There were no vitals filed for this visit. There is no height or weight on file to calculate BMI. GEN: NAD EYE: Sclerae anicteric ENT: MMM CV: Non-tachycardic GI: Soft, NT/ND NEURO:  Alert & Oriented x 3  Lab Results: No results for input(s): "WBC", "HGB", "HCT", "PLT" in the last 72 hours. BMET No results for input(s): "NA", "K", "CL", "CO2", "GLUCOSE", "BUN", "CREATININE", "CALCIUM " in the last 72 hours. LFT No results for input(s): "PROT", "ALBUMIN", "AST", "ALT",  "ALKPHOS", "BILITOT", "BILIDIR", "IBILI" in the last 72 hours. PT/INR No results for input(s): "LABPROT", "INR" in the last 72 hours.   Impression / Plan: This is a 68 y.o.female who presents for Colonoscopy for surveillance of previous adenomas and advanced polyps.  The risks and benefits of endoscopic evaluation/treatment were discussed with the patient and/or family; these include but are not limited to the risk of perforation, infection, bleeding, missed lesions, lack of diagnosis, severe illness requiring hospitalization, as well as anesthesia and sedation related illnesses.  The patient's history has been reviewed, patient examined, no change in status, and deemed stable for procedure.  The patient and/or family is agreeable to proceed.    Yong Henle, MD [facility]oscopy Office # [phone]

## 2023-11-01 NOTE — Progress Notes (Signed)
 A/O x 3, gd SR's, VSS, report to RN

## 2023-11-01 NOTE — Progress Notes (Signed)
 Pt's states no medical or surgical changes since previsit or office visit.

## 2023-11-01 NOTE — Op Note (Signed)
 [facility] Patient Name: Nancy [name] Procedure Date: 11/01/2023 10:49 AM MRN: [id number] Endoscopist: Yong Henle , MD, [id number] Age: 68 Referring MD:  Date of Birth: 06-30-1955 Gender: Female Account #: [id number] Procedure:                Colonoscopy Indications:              Surveillance: Personal history of adenomatous                            polyps on last colonoscopy > 3 years ago, High risk                            colon cancer surveillance: Personal history of                            adenoma (10 mm or greater in size) Medicines:                Monitored Anesthesia Care Procedure:                Pre-Anesthesia Assessment:                           - Prior to the procedure, a History and Physical                            was performed, and patient medications and                            allergies were reviewed. The patient's tolerance of                            previous anesthesia was also reviewed. The risks                            and benefits of the procedure and the sedation                            options and risks were discussed with the patient.                            All questions were answered, and informed consent                            was obtained. Prior Anticoagulants: The patient has                            taken no anticoagulant or antiplatelet agents                            except for aspirin . ASA Grade Assessment: III - A                            patient with severe systemic disease. After  reviewing the risks and benefits, the patient was                            deemed in satisfactory condition to undergo the                            procedure.                           After obtaining informed consent, the colonoscope                            was passed under direct vision. Throughout the                            procedure, the patient's blood pressure, pulse, and                             oxygen saturations were monitored continuously. The                            Olympus Scope [id number] was introduced through the                            anus and advanced to the 3 cm into the ileum. The                            colonoscopy was performed without difficulty. The                            patient tolerated the procedure. The quality of the                            bowel preparation was good. The terminal ileum,                            ileocecal valve, appendiceal orifice, and rectum                            were photographed. Scope In: 10:58:57 AM Scope Out: 11:10:43 AM Scope Withdrawal Time: 0 hours 8 minutes 28 seconds  Total Procedure Duration: 0 hours 11 minutes 46 seconds  Findings:                 The digital rectal exam findings include                            hemorrhoids. Pertinent negatives include no                            palpable rectal lesions.                           The terminal ileum and ileocecal valve appeared  normal.                           A few small-mouthed diverticula were found in the                            ascending colon.                           A medium post polypectomy scar was found in the                            transverse colon. There was no evidence of the                            previous polyp.                           Multiple small-mouthed diverticula were found in                            the recto-sigmoid colon and sigmoid colon.                           Normal mucosa was found in the entire colon                            otherwise.                           Non-bleeding non-thrombosed external and internal                            hemorrhoids were found during retroflexion, during                            perianal exam and during digital exam. The                            hemorrhoids were Grade II (internal hemorrhoids                             that prolapse but reduce spontaneously). Complications:            No immediate complications. Estimated Blood Loss:     Estimated blood loss was minimal. Estimated blood                            loss: none. Impression:               - Hemorrhoids found on digital rectal exam.                           - The examined portion of the ileum was normal.                           - Diverticulosis in the ascending  colon.                           - Post-polypectomy scar in the transverse colon.                           - Diverticulosis in the recto-sigmoid colon and in                            the sigmoid colon.                           - Normal mucosa in the entire examined colon                            otherwise.                           - Non-bleeding non-thrombosed external and internal                            hemorrhoids. Recommendation:           - The patient will be observed post-procedure,                            until all discharge criteria are met.                           - Discharge patient to home.                           - Patient has a contact number available for                            emergencies. The signs and symptoms of potential                            delayed complications were discussed with the                            patient. Return to normal activities tomorrow.                            Written discharge instructions were provided to the                            patient.                           - High fiber diet.                           - Use FiberCon 1-2 tablets PO daily.                           - Continue present medications.                           -  Repeat colonoscopy in 5 years for surveillance,                            due to history of previous advanced adenoma                            (patient should never go more than 5 years between                            procedures).                           - The  findings and recommendations were discussed                            with the patient.                           - The findings and recommendations were discussed                            with the patient's family. Yong Henle, MD 11/01/2023 11:16:30 AM

## 2023-11-02 ENCOUNTER — Telehealth: Payer: Self-pay | Admitting: *Deleted

## 2023-11-02 NOTE — Telephone Encounter (Signed)
  Follow up Call-     11/01/2023   10:06 AM  Call back number  Post procedure Call Back phone  # [phone]  Permission to leave phone message Yes     Patient questions:  Do you have a fever, pain , or abdominal swelling? No. Pain Score  0 *  Have you tolerated food without any problems? Yes.    Have you been able to return to your normal activities? Yes.    Do you have any questions about your discharge instructions: Diet   No. Medications  No. Follow up visit  No.  Do you have questions or concerns about your Care? No.  Actions: * If pain score is 4 or above: No action needed, pain <4.

## 2023-11-03 ENCOUNTER — Ambulatory Visit: Payer: Self-pay | Admitting: Primary Care

## 2023-11-09 ENCOUNTER — Telehealth: Payer: Self-pay | Admitting: Pharmacist

## 2023-11-09 NOTE — Telephone Encounter (Signed)
 Call to f/u on may 1 lipid visit. Patient agreed on starting Leqvio. Will come in to sign start form on Tuesday November 15, 2023

## 2023-11-15 ENCOUNTER — Encounter: Payer: Self-pay | Admitting: Cardiovascular Disease

## 2023-11-15 NOTE — Telephone Encounter (Signed)
 Please review and advise.

## 2023-11-17 DIAGNOSIS — E782 Mixed hyperlipidemia: Secondary | ICD-10-CM

## 2023-11-18 ENCOUNTER — Other Ambulatory Visit (INDEPENDENT_AMBULATORY_CARE_PROVIDER_SITE_OTHER)

## 2023-11-18 ENCOUNTER — Ambulatory Visit: Payer: Self-pay | Admitting: Primary Care

## 2023-11-18 DIAGNOSIS — E782 Mixed hyperlipidemia: Secondary | ICD-10-CM

## 2023-11-18 LAB — LIPID PANEL
Cholesterol: 300 mg/dL — ABNORMAL HIGH (ref 0–200)
HDL: 64.4 mg/dL (ref >39.00)
LDL Cholesterol: 218 mg/dL — ABNORMAL HIGH (ref 0–99)
NonHDL: 236.09
Total CHOL/HDL Ratio: 5
Triglycerides: 89 mg/dL (ref 0.0–149.0)
VLDL: 17.8 mg/dL (ref 0.0–40.0)

## 2023-11-18 MED ORDER — COLESEVELAM HCL 625 MG PO TABS: 1875.0000 mg | ORAL_TABLET | Freq: Two times a day (BID) | ORAL | 0 refills | Status: DC

## 2023-12-03 ENCOUNTER — Encounter (HOSPITAL_COMMUNITY): Payer: Self-pay

## 2023-12-03 ENCOUNTER — Ambulatory Visit (HOSPITAL_COMMUNITY): Admission: EM | Admit: 2023-12-03 | Discharge: 2023-12-03 | Disposition: A | Discharge status: Home / Self Care

## 2023-12-03 ENCOUNTER — Other Ambulatory Visit: Payer: Self-pay | Admitting: Primary Care

## 2023-12-03 DIAGNOSIS — L509 Urticaria, unspecified: Secondary | ICD-10-CM

## 2023-12-03 DIAGNOSIS — W57XXXA Bitten or stung by nonvenomous insect and other nonvenomous arthropods, initial encounter: Secondary | ICD-10-CM

## 2023-12-03 DIAGNOSIS — F411 Generalized anxiety disorder: Secondary | ICD-10-CM

## 2023-12-03 DIAGNOSIS — S30861A Insect bite (nonvenomous) of abdominal wall, initial encounter: Secondary | ICD-10-CM | POA: Diagnosis not present

## 2023-12-03 NOTE — ED Triage Notes (Signed)
 Patient here today with c/o an insect bite on the right lower part of her abd. There are 2 areas that have a mark from either being stung or bit. Patient c/o itching, burning, and stinging. Area has some swelling and redness as well.

## 2023-12-03 NOTE — ED Provider Notes (Signed)
 [facility]    CSN: [id number] Arrival date & time: 12/03/23  1710      History   Chief Complaint Chief Complaint  Patient presents with   Insect Bite    HPI LUELLA GARDENHIRE is a 68 y.o. female.   Patient presents with concerns for insect bite to her right lower abdominal wall.  Patient states that she felt something sting her around 3 PM today.  Patient states that she noticed 2 areas that appeared to be bug bites in the areas where initially hard and swollen.  Patient states the area is no longer hard or swollen but there is some erythema around the area.  Patient states that the erythema and swelling have decreased while she was waiting in the lobby.  Patient denies taking anything for her symptoms or applying anything to the area.  The history is provided by the patient and medical records.    Past Medical History:  Diagnosis Date   Allergy to alpha-gal    Breast cancer (HCC) 10/23/2020   CAD (coronary artery disease)    Chronic neck pain 04/11/2018   Coronary artery calcification seen on CAT scan    Eczema    Essential hypertension 07/20/2019   Fatigue    GAD (generalized anxiety disorder) 06/26/2018   Hyperkalemia 07/27/2023   Hyperlipidemia    Lichen sclerosus    Overweight (BMI 25.0-29.9) 01/09/2018   Recurrent upper respiratory infection (URI)    Routine general medical examination at a health care facility    Tick bite of left shoulder 09/29/2021   Urinary frequency 06/06/2019   Urticaria    Vaginal itching 06/06/2019    Patient Active Problem List   Diagnosis Date Noted   Lower extremity pain, bilateral 07/27/2023   Osteoarthritis of right hip 07/27/2023   Chronic congestion of paranasal sinus 08/31/2022   CAD (coronary artery disease) 07/01/2022   Angina pectoris, unspecified (HCC) 06/30/2022   Allergy to alpha-gal 08/20/2021   Prediabetes 08/20/2021   Vitamin B 12 deficiency 08/20/2021   Vertigo 03/29/2021   Paresthesia 03/29/2021    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   Positive colorectal cancer screening using Cologuard test    Polyp of sigmoid colon    Polyp of transverse colon    Preventative health care 08/06/2020   Allergic rhinitis 07/10/2020   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   Other polyp of sinus 07/10/2020   Full body hives 05/26/2020   Essential hypertension 07/20/2019   Lichen sclerosus 06/06/2019   GAD (generalized anxiety disorder) 06/26/2018   Chronic neck pain 04/11/2018   Overweight (BMI 25.0-29.9) 01/09/2018   Hyperlipidemia 08/25/2010    Past Surgical History:  Procedure Laterality Date   ANTERIOR CERVICAL DECOMP/DISCECTOMY FUSION     BREAST LUMPECTOMY WITH RADIOACTIVE SEED LOCALIZATION Left 11/14/2020   Procedure: LEFT BREAST LUMPECTOMY WITH RADIOACTIVE SEED LOCALIZATION;  Surgeon: Vanderbilt Ned, MD;  Location: [facility];  Service: General;  Laterality: Left;   CESAREAN SECTION     1982 and 1983   COLONOSCOPY WITH PROPOFOL  N/A 10/06/2020   Procedure: COLONOSCOPY WITH PROPOFOL ;  Surgeon: Janalyn Keene NOVAK, MD;  Location: [facility];  Service: Endoscopy;  Laterality: N/A;   CORONARY STENT INTERVENTION N/A 06/30/2022   Procedure: CORONARY STENT INTERVENTION;  Surgeon: Wonda Sharper, MD;  Location: [facility]ASIVE CV LAB;  Service: Cardiovascular;  Laterality: N/A;   LEFT HEART CATH AND CORONARY ANGIOGRAPHY N/A 06/30/2022   Procedure: LEFT HEART CATH  AND CORONARY ANGIOGRAPHY;  Surgeon: Wonda Sharper, MD;  Location: [facility] INVASIVE CV LAB;  Service: Cardiovascular;  Laterality: N/A;   PARTIAL HYSTERECTOMY     TUBAL LIGATION  1985    OB History   No obstetric history on file.      Home Medications    Prior to Admission medications   Medication Sig Start Date End Date Taking? Authorizing Provider  aspirin  (ASPIRIN  81) 81 MG chewable tablet  06/30/22   [provider]  aspirin  EC 81 MG tablet Take 1 tablet (81 mg  total) by mouth daily. 06/24/22   Dunn, Dayna N, PA-C  Cholecalciferol (VITAMIN D-3 PO) Take 1 tablet by mouth 3 (three) times a week.    [provider]  citalopram  (CELEXA ) 20 MG tablet TAKE 1 TABLET (20 MG TOTAL) BY MOUTH DAILY. FOR ANXIETY. 08/26/23   Clark, Katherine K, NP  clobetasol cream (TEMOVATE) 0.05 % Apply 1 application  topically daily. 07/18/19   [provider]  colesevelam  (WELCHOL ) 625 MG tablet Take 3 tablets (1,875 mg total) by mouth 2 (two) times daily with a meal. for cholesterol. 11/18/23 02/16/24  [name], NP  Cyanocobalamin  (B-12) 1000 MCG CAPS Take 1 tablet by mouth 3 (three) times a week. 08/08/20   [provider]  EPINEPHrine  0.3 mg/0.3 mL IJ SOAJ injection INJECT 0.3 MG INTO THE MUSCLE AS NEEDED FOR ANAPHYLAXIS. 02/23/23   Clark, Katherine K, NP  metoprolol  succinate (TOPROL  XL) 25 MG 24 hr tablet Take 0.5 tablets (12.5 mg total) by mouth daily. 07/26/23   Parthenia Olivia HERO, PA-C  nitroGLYCERIN  (NITROLINGUAL ) 0.4 MG/SPRAY spray Place 1 spray under the tongue every 5 (five) minutes as needed for chest pain. 07/01/22   Goodrich, Callie E, PA-C  tacrolimus (PROTOPIC) 0.1 % ointment APPLY A THIN LAYER TO THE AFFECTED AREA(S) BY TOPICAL ROUTE 2 TIMES PER DAY ; RUB IN GENTLY AND COMPLETELY Patient not taking: Reported on 12/03/2023 10/25/23   [provider]    Family History Family History  Problem Relation Age of Onset   Hydrocephalus Mother    Neuropathy Mother    Diabetes Father    Hypertension Father    Thyroid  disease Maternal Aunt    Thyroid  cancer Cousin    Colon cancer Neg Hx    Rectal cancer Neg Hx    Stomach cancer Neg Hx    Esophageal cancer Neg Hx    Colon polyps Neg Hx     Social History Social History   Tobacco Use   Smoking status: Never   Smokeless tobacco: Never  Vaping Use   Vaping status: Never Used  Substance Use Topics   Alcohol use: Not Currently    Alcohol/week: 0.0 standard drinks of alcohol    Drug use: No     Allergies   Alpha-gal, Other, Macrobid  [nitrofurantoin ], Pravastatin , Repatha  [evolocumab ], Rosuvastatin , Shellfish allergy, Simvastatin , Atorvastatin , Lisinopril , Milk protein, Nitroglycerin , Statins, and Zetia  [ezetimibe ]   Review of Systems Review of Systems  Per HPI  Physical Exam Triage Vital Signs ED Triage Vitals  Encounter Vitals Group     BP 12/03/23 1751 (!) 143/78     Girls Systolic BP Percentile --      Girls Diastolic BP Percentile --      Boys Systolic BP Percentile --      Boys Diastolic BP Percentile --      Pulse Rate 12/03/23 1751 71     Resp 12/03/23 1751 16     Temp 12/03/23 1751  98.6 F (37 C)     Temp Source 12/03/23 1751 Oral     SpO2 12/03/23 1751 96 %     Weight --      Height --      Head Circumference --      Peak Flow --      Pain Score 12/03/23 1753 2     Pain Loc --      Pain Education --      Exclude from Growth Chart --    No data found.  Updated Vital Signs BP (!) 143/78 (BP Location: Right Arm)   Pulse 71   Temp 98.6 F (37 C) (Oral)   Resp 16   SpO2 96%   Visual Acuity Right Eye Distance:   Left Eye Distance:   Bilateral Distance:    Right Eye Near:   Left Eye Near:    Bilateral Near:     Physical Exam Vitals and nursing note reviewed.  Constitutional:      General: She is awake. She is not in acute distress.    Appearance: Normal appearance. She is well-developed and well-groomed. She is not ill-appearing.   Skin:    Findings: Erythema present.         Comments: Approximately 5 cm in diameter area of erythema and raised skin it noted to the right lower abdominal wall.  There are 2 small puncture wounds that appear to be bug bites or stings.   Neurological:     Mental Status: She is alert.   Psychiatric:        Behavior: Behavior is cooperative.      UC Treatments / Results  Labs (all labs ordered are listed, but only abnormal results are displayed) Labs Reviewed - No data to  display  EKG   Radiology No results found.  Procedures Procedures (including critical care time)  Medications Ordered in UC Medications - No data to display  Initial Impression / Assessment and Plan / UC Course  I have reviewed the triage vital signs and the nursing notes.  Pertinent labs & imaging results that were available during my care of the patient were reviewed by me and considered in my medical decision making (see chart for details).     Patient is overall well-appearing.  Vitals are stable.  Upon assessment there is an approximately 5 cm in diameter area of erythema and raised skin noted to the right lower abdominal wall.  There are 2 small puncture wounds that appear to be bug bites or stings.  Due to patient stating that the area has decreased in size I do not believe this is requires systemic treatment at this time.  Recommended patient apply hydrocortisone cream to the area and keep an eye on the area.  Discussed return and strict ER precautions. Final Clinical Impressions(s) / UC Diagnoses   Final diagnoses:  Insect bite of abdominal wall, initial encounter     Discharge Instructions      I recommend applying hydrocortisone to this area to help decrease the inflammation and redness. Keep an eye on this area.  If you notice that the redness is spreading, you have increased swelling, or drainage from this area please seek immediate medical treatment in the emergency department    ED Prescriptions   None    PDMP not reviewed this encounter.   Johnie Flaming A, NP 12/03/23 [phone]

## 2023-12-03 NOTE — Discharge Instructions (Signed)
 I recommend applying hydrocortisone to this area to help decrease the inflammation and redness. Keep an eye on this area.  If you notice that the redness is spreading, you have increased swelling, or drainage from this area please seek immediate medical treatment in the emergency department

## 2023-12-06 MED ORDER — CITALOPRAM HYDROBROMIDE 20 MG PO TABS: 20.0000 mg | ORAL_TABLET | Freq: Every day | ORAL | 2 refills | Status: AC

## 2023-12-06 NOTE — Telephone Encounter (Signed)
 Copied from CRM [id number]. Topic: Clinical - Medication Refill >> Dec 06, 2023  9:26 AM Aleatha C wrote: Medication:  citalopram  (CELEXA ) 20 MG tablet   Has the patient contacted their pharmacy? Yes (Agent: If no, request that the patient contact the pharmacy for the refill. If patient does not wish to contact the pharmacy document the reason why and proceed with request.) (Agent: If yes, when and what did the pharmacy advise?)  This is the patient's preferred pharmacy:  [id number] [place], [place] - [address] [place] [address] Phone: [phone] Fax: [phone]    Is this the correct pharmacy for this prescription? Yes If no, delete pharmacy and type the correct one.   Has the prescription been filled recently? No  Is the patient out of the medication? Yes  Has the patient been seen for an appointment in the last year OR does the patient have an upcoming appointment? Yes  Can we respond through MyChart? Yes  Agent: Please be advised that Rx refills may take up to 3 business days. We ask that you follow-up with your pharmacy.

## 2024-01-03 ENCOUNTER — Other Ambulatory Visit: Payer: Self-pay | Admitting: Primary Care

## 2024-01-03 DIAGNOSIS — E782 Mixed hyperlipidemia: Secondary | ICD-10-CM

## 2024-01-10 ENCOUNTER — Ambulatory Visit (INDEPENDENT_AMBULATORY_CARE_PROVIDER_SITE_OTHER): Payer: PPO

## 2024-01-10 VITALS — Ht 63.0 in | Wt 156.0 lb

## 2024-01-10 DIAGNOSIS — Z0000 Encounter for general adult medical examination without abnormal findings: Secondary | ICD-10-CM | POA: Diagnosis not present

## 2024-01-10 NOTE — Patient Instructions (Signed)
 Nancy [name] , Thank you for taking time out of your busy schedule to complete your Annual Wellness Visit with me. I enjoyed our conversation and look forward to speaking with you again next year. I, as well as your care team,  appreciate your ongoing commitment to your health goals. Please review the following plan we discussed and let me know if I can assist you in the future. Your Game plan/ To Do List     Follow up Visits: Next Medicare AWV with our clinical staff: 01/10/25 @ 8:50am televisit   Have you seen your provider in the last 6 months (3 months if uncontrolled diabetes)? Yes Next Office Visit with your provider: not scheduled yet due March 2026  Clinician Recommendations:  Aim for 30 minutes of exercise or brisk walking, 6-8 glasses of water, and 5 servings of fruits and vegetables each day.       This is a list of the screening recommended for you and due dates:  Health Maintenance  Topic Date Due   Zoster (Shingles) Vaccine (1 of 2) Never done   COVID-19 Vaccine (3 - Moderna risk series) 10/16/2019   Flu Shot  01/13/2024   Medicare Annual Wellness Visit  01/09/2025   Mammogram  10/30/2025   DTaP/Tdap/Td vaccine (2 - Td or Tdap) 12/12/2025   Colon Cancer Screening  10/31/2028   Pneumococcal Vaccine for age over 43  Completed   DEXA scan (bone density measurement)  Completed   Hepatitis C Screening  Completed   Hepatitis B Vaccine  Aged Out   HPV Vaccine  Aged Out   Meningitis B Vaccine  Aged Out    Advanced directives: (Copy Requested) Please bring a copy of your health care power of attorney and living will to the office to be added to your chart at your convenience. You can mail to [facility] [address] [place], [place] [address] or email to [email or web address] Advance Care Planning is important because it:  [x]  Makes sure you receive the medical care that is consistent with your values, goals, and preferences  [x]  It provides guidance to  your family and loved ones and reduces their decisional burden about whether or not they are making the right decisions based on your wishes.  Follow the link provided in your after visit summary or read over the paperwork we have mailed to you to help you started getting your Advance Directives in place. If you need assistance in completing these, please reach out to us  so that we can help you!

## 2024-01-10 NOTE — Progress Notes (Signed)
 Subjective:   Nancy [name] is a 68 y.o. who presents for a Medicare Wellness preventive visit.  As a reminder, Annual Wellness Visits don't include a physical exam, and some assessments may be limited, especially if this visit is performed virtually. We may recommend an in-person follow-up visit with your provider if needed.  Visit Complete: Virtual I connected with  Nancy [name] on 01/10/24 by a audio enabled telemedicine application and verified that I am speaking with the correct person using two identifiers.  Patient Location: Home  Provider Location: Office/Clinic  I discussed the limitations of evaluation and management by telemedicine. The patient expressed understanding and agreed to proceed.  Vital Signs: Because this visit was a virtual/telehealth visit, some criteria may be missing or patient reported. Any vitals not documented were not able to be obtained and vitals that have been documented are patient reported.  VideoDeclined- This patient declined Librarian, academic. Therefore the visit was completed with audio only.  Persons Participating in Visit: Patient.  AWV Questionnaire: Yes: Patient Medicare AWV questionnaire was completed by the patient on 01/06/24; I have confirmed that all information answered by patient is correct and no changes since this date.  Cardiac Risk Factors include: advanced age (>63men, >52 women);dyslipidemia;hypertension;sedentary lifestyle     Objective:    Today's Vitals   01/10/24 0851  Weight: 156 lb (70.8 kg)  Height: 5' 3 (1.6 m)   Body mass index is 27.63 kg/m.     01/10/2024    9:01 AM 01/05/2023   11:16 AM 06/30/2022   11:19 AM 01/06/2022    8:31 AM 11/14/2020    6:54 AM 11/11/2020   10:41 AM 11/06/2020    4:01 PM  Advanced Directives  Does Patient Have a Medical Advance Directive? Yes Yes Yes No Yes Yes Yes  Type of Estate agent of [employer];Living will Healthcare Power of  [place];Living will Healthcare Power of [employer] of [place];Living will Healthcare Power of [place];Living will Healthcare Power of [place];Living will  Does patient want to make changes to medical advance directive?   No - Patient declined  No - Patient declined No - Patient declined   Copy of Healthcare Power of Attorney in Chart? No - copy requested No - copy requested   No - copy requested No - copy requested   Would patient like information on creating a medical advance directive?    No - Patient declined       Current Medications (verified) Outpatient Encounter Medications as of 01/10/2024  Medication Sig   aspirin  (ASPIRIN  81) 81 MG chewable tablet    aspirin  EC 81 MG tablet Take 1 tablet (81 mg total) by mouth daily.   Cholecalciferol (VITAMIN D-3 PO) Take 1 tablet by mouth 3 (three) times a week.   citalopram  (CELEXA ) 20 MG tablet Take 1 tablet (20 mg total) by mouth daily. For anxiety.   clobetasol cream (TEMOVATE) 0.05 % Apply 1 application  topically daily.   colesevelam  (WELCHOL ) 625 MG tablet Take 3 tablets (1,875 mg total) by mouth 2 (two) times daily with a meal. for cholesterol.   Cyanocobalamin  (B-12) 1000 MCG CAPS Take 1 tablet by mouth 3 (three) times a week.   EPINEPHRINE  0.3 mg/0.3 mL IJ SOAJ injection INJECT 0.3 MG INTO THE MUSCLE AS NEEDED FOR ANAPHYLAXIS.   metoprolol  succinate (TOPROL  XL) 25 MG 24 hr tablet Take 0.5 tablets (12.5 mg total) by mouth daily.   nitroGLYCERIN  (NITROLINGUAL ) 0.4 MG/SPRAY  spray Place 1 spray under the tongue every 5 (five) minutes as needed for chest pain.   tacrolimus (PROTOPIC) 0.1 % ointment APPLY A THIN LAYER TO THE AFFECTED AREA(S) BY TOPICAL ROUTE 2 TIMES PER DAY ; RUB IN GENTLY AND COMPLETELY (Patient not taking: Reported on 12/03/2023)   No facility-administered encounter medications on file as of 01/10/2024.    Allergies (verified) Alpha-gal, Other, Macrobid  [nitrofurantoin ], Pravastatin , Repatha  [evolocumab ],  Rosuvastatin , Shellfish allergy, Simvastatin , Atorvastatin , Lisinopril , Milk protein, Nitroglycerin , Statins, and Zetia  [place].Ast ]   History: Past Medical History:  Diagnosis Date   Allergy 04/12/2020   Alpha gal syndrome   Allergy to alpha-gal    Arthritis    Breast cancer (HCC) 10/23/2020   CAD (coronary artery disease)    Cataract    Chronic neck pain 04/11/2018   Coronary artery calcification seen on CAT scan    Eczema    Essential hypertension 07/20/2019   Fatigue    GAD (generalized anxiety disorder) 06/26/2018   GERD (gastroesophageal reflux disease)    Heart murmur ?   As a child   Hyperkalemia 07/27/2023   Hyperlipidemia    Lichen sclerosus    Overweight (BMI 25.0-29.9) 01/09/2018   Recurrent upper respiratory infection (URI)    Routine general medical examination at a health care facility    Tick bite of left shoulder 09/29/2021   Urinary frequency 06/06/2019   Urticaria    Vaginal itching 06/06/2019   Past Surgical History:  Procedure Laterality Date   ABDOMINAL HYSTERECTOMY  1992   Partial   ANTERIOR CERVICAL DECOMP/DISCECTOMY FUSION     BREAST LUMPECTOMY WITH RADIOACTIVE SEED LOCALIZATION Left 11/14/2020   Procedure: LEFT BREAST LUMPECTOMY WITH RADIOACTIVE SEED LOCALIZATION;  Surgeon: Vanderbilt Ned, MD;  Location: [facility];  Service: General;  Laterality: Left;   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   CESAREAN SECTION     1982 and 1983   COLONOSCOPY WITH PROPOFOL  N/A 10/06/2020   Procedure: COLONOSCOPY WITH PROPOFOL ;  Surgeon: Janalyn Keene NOVAK, MD;  Location: [facility];  Service: Endoscopy;  Laterality: N/A;   CORONARY STENT INTERVENTION N/A 06/30/2022   Procedure: CORONARY STENT INTERVENTION;  Surgeon: Wonda Sharper, MD;  Location: [facility] INVASIVE CV LAB;  Service: Cardiovascular;  Laterality: N/A;   EYE SURGERY  2024   Cataracts   LEFT HEART CATH AND CORONARY ANGIOGRAPHY N/A 06/30/2022   Procedure: LEFT HEART CATH AND CORONARY  ANGIOGRAPHY;  Surgeon: Wonda Sharper, MD;  [facility] CV LAB;  Service: Cardiovascular;  Laterality: N/A;   PARTIAL HYSTERECTOMY     SPINE SURGERY  1992   2ruptured discs   TUBAL LIGATION  1985   Family History  Problem Relation Age of Onset   Hydrocephalus Mother    Neuropathy Mother    Anxiety disorder Mother    Cancer Mother    Miscarriages / [place] Mother    Diabetes Father    Hypertension Father    Thyroid  disease Maternal Aunt    Thyroid  cancer Cousin    Diabetes Son    Diabetes Son    Colon cancer Neg Hx    Rectal cancer Neg Hx    Stomach cancer Neg Hx    Esophageal cancer Neg Hx    Colon polyps Neg Hx    Social History   Socioeconomic History   Marital status: Divorced    Spouse name: Not on file   Number of children: 2   Years of education: Not on file   Highest  education level: 12th grade  Occupational History   Not on file  Tobacco Use   Smoking status: Never   Smokeless tobacco: Never  Vaping Use   Vaping status: Never Used  Substance and Sexual Activity   Alcohol use: Not Currently    Alcohol/week: 0.0 standard drinks of alcohol   Drug use: No   Sexual activity: Not on file  Other Topics Concern   Not on file  Social History Narrative   Divorced.  G3P2.   Regular exercise-yes   Social Drivers of Health   Financial Resource Strain: Low Risk  (01/06/2024)   Overall Financial Resource Strain (CARDIA)    Difficulty of Paying Living Expenses: Not hard at all  Food Insecurity: No Food Insecurity (01/06/2024)   Hunger Vital Sign    Worried About Running Out of Food in the Last Year: Never true    Ran Out of Food in the Last Year: Never true  Transportation Needs: No Transportation Needs (01/06/2024)   PRAPARE - Administrator, Civil Service (Medical): No    Lack of Transportation (Non-Medical): No  Physical Activity: Unknown (01/06/2024)   Exercise Vital Sign    Days of Exercise per Week: Patient declined    Minutes of  Exercise per Session: Not on file  Stress: No Stress Concern Present (01/06/2024)   [employer] of Occupational Health - Occupational Stress Questionnaire    Feeling of Stress: Not at all  Social Connections: Socially Isolated (01/06/2024)   Social Connection and Isolation Panel    Frequency of Communication with Friends and Family: More than three times a week    Frequency of Social Gatherings with Friends and Family: More than three times a week    Attends Religious Services: Patient declined    Database administrator or Organizations: No    Attends Engineer, structural: Not on file    Marital Status: Divorced    Tobacco Counseling Counseling given: Not Answered    Clinical Intake:  Pre-visit preparation completed: Yes  Pain : No/denies pain     BMI - recorded: 27.63 Nutritional Risks: None Diabetes: No  Lab Results  Component Value Date   HGBA1C 6.0 09/01/2023   HGBA1C 5.9 08/31/2022   HGBA1C 6.0 08/20/2021     How often do you need to have someone help you when you read instructions, pamphlets, or other written materials from your doctor or pharmacy?: 1 - Never  Interpreter Needed?: No  Comments: lives alone Information entered by :: B.Lamiya Naas,LPN   Activities of Daily Living     01/06/2024   10:21 AM  In your present state of health, do you have any difficulty performing the following activities:  Hearing? 0  Vision? 0  Difficulty concentrating or making decisions? 0  Walking or climbing stairs? 0  Dressing or bathing? 0  Doing errands, shopping? 0  Preparing Food and eating ? N  Using the Toilet? N  In the past six months, have you accidently leaked urine? Y  Do you have problems with loss of bowel control? N  Managing your Medications? N  Managing your Finances? N  Housekeeping or managing your Housekeeping? N    Patient Care Team: [name], NP as PCP - General (Internal Medicine) [name], Aleene PARAS, MD (Inactive) as PCP  - Cardiology (Cardiology) Anderson, Dena D, PA-C (Physician Assistant) Rox Charleston, MD as Consulting Physician (Obstetrics and Gynecology) Glean Stephane BROCKS, RN (Inactive) as Oncology Nurse Navigator Tyree Nanetta SAILOR, RN  as Oncology Nurse Navigator [facility] Brinda Elsie BRAVO, OD (Optometry)  I have updated your Care Teams any recent Medical Services you may have received from other providers in the past year.     Assessment:   This is a routine wellness examination for Nancy [name].  Hearing/Vision screen Hearing Screening - Comments:: Pt says her hearing is good Vision Screening - Comments:: Pt says her vision Is good;readers only [facility]   Goals Addressed             This Visit's Progress    DIET - EAT MORE FRUITS AND VEGETABLES   Not on track    01/10/24     Patient Stated   On track    01/10/24-Stay healthy.     Patient Stated       I am going to start back walking, eat cleaner, increase water and lose some weight       Depression Screen     01/10/2024    8:58 AM 09/01/2023    8:50 AM 07/27/2023   10:51 AM 01/05/2023   11:15 AM 10/19/2022    2:15 PM 08/31/2022   10:12 AM 01/06/2022    8:29 AM  PHQ 2/9 Scores  PHQ - 2 Score 0 0 0 0 0 0 0  PHQ- 9 Score       0    Fall Risk     01/06/2024   10:21 AM 09/01/2023    8:50 AM 07/27/2023   10:51 AM 01/05/2023   11:17 AM 01/03/2023    9:21 AM  Fall Risk   Falls in the past year? 1 0 0 0 0  Number falls in past yr: 0 0 0 0   Injury with Fall? 1 0 0 0   Risk for fall due to :  No Fall Risks No Fall Risks No Fall Risks   Follow up  Falls evaluation completed Falls evaluation completed Falls prevention discussed;Falls evaluation completed     MEDICARE RISK AT HOME:  Medicare Risk at Home Any stairs in or around the home?: (Patient-Rptd) No If so, are there any without handrails?: (Patient-Rptd) No Home free of loose throw rugs in walkways, pet beds, electrical cords, etc?: (Patient-Rptd) Yes Adequate lighting  in your home to reduce risk of falls?: (Patient-Rptd) Yes Life alert?: (Patient-Rptd) No Use of a cane, walker or w/c?: (Patient-Rptd) No Grab bars in the bathroom?: (Patient-Rptd) No Shower chair or bench in shower?: (Patient-Rptd) No Elevated toilet seat or a handicapped toilet?: (Patient-Rptd) No  TIMED UP AND GO:  Was the test performed?  No  Cognitive Function: 6CIT completed        01/10/2024    9:02 AM 01/05/2023   11:18 AM 01/06/2022    8:34 AM  6CIT Screen  What Year? 0 points 0 points 0 points  What month? 0 points 0 points 0 points  What time? 0 points 0 points 0 points  Count back from 20 0 points 0 points 0 points  Months in reverse 0 points 0 points 0 points  Repeat phrase 0 points 0 points 0 points  Total Score 0 points 0 points 0 points    Immunizations Immunization History  Administered Date(s) Administered   Fluad Quad(high Dose 65+) 05/26/2020   Influenza,inj,Quad PF,6+ Mos 04/11/2018   Influenza-Unspecified 02/13/2020   Moderna Sars-Covid-2 Vaccination 08/24/2019, 09/18/2019   Pneumococcal Conjugate-13 08/06/2020   Pneumococcal Polysaccharide-23 08/20/2021   Tdap 12/13/2015    Screening Tests Health Maintenance  Topic Date Due   Zoster Vaccines- Shingrix (1 of 2) Never done   COVID-19 Vaccine (3 - Moderna risk series) 10/16/2019   INFLUENZA VACCINE  01/13/2024   Medicare Annual Wellness (AWV)  01/09/2025   MAMMOGRAM  10/30/2025   DTaP/Tdap/Td (2 - Td or Tdap) 12/12/2025   Colonoscopy  10/31/2028   Pneumococcal Vaccine: 50+ Years  Completed   DEXA SCAN  Completed   Hepatitis C Screening  Completed   Hepatitis B Vaccines  Aged Out   HPV VACCINES  Aged Out   Meningococcal B Vaccine  Aged Out    Health Maintenance  Health Maintenance Due  Topic Date Due   Zoster Vaccines- Shingrix (1 of 2) Never done   COVID-19 Vaccine (3 - Moderna risk series) 10/16/2019   Health Maintenance Items Addressed: None needed at this time  Additional  Screening:  Vision Screening: Recommended annual ophthalmology exams for early detection of glaucoma and other disorders of the eye. Would you like a referral to an eye doctor? No    Dental Screening: Recommended annual dental exams for proper oral hygiene  Community Resource Referral / Chronic Care Management: CRR required this visit?  No   CCM required this visit?  No   Plan:    I have personally reviewed and noted the following in the patient's chart:   Medical and social history Use of alcohol, tobacco or illicit drugs  Current medications and supplements including opioid prescriptions. Patient is not currently taking opioid prescriptions. Functional ability and status Nutritional status Physical activity Advanced directives List of other physicians Hospitalizations, surgeries, and ER visits in previous 12 months Vitals Screenings to include cognitive, depression, and falls Referrals and appointments  In addition, I have reviewed and discussed with patient certain preventive protocols, quality metrics, and best practice recommendations. A written personalized care plan for preventive services as well as general preventive health recommendations were provided to patient.   Nancy LITTIE Saris, LPN   2/70/7974   After Visit Summary: (MyChart) Due to this being a telephonic visit, the after visit summary with patients personalized plan was offered to patient via MyChart   Notes: Nothing significant to report at this time.

## 2024-01-18 ENCOUNTER — Ambulatory Visit: Payer: Self-pay | Admitting: Primary Care

## 2024-01-18 ENCOUNTER — Other Ambulatory Visit (INDEPENDENT_AMBULATORY_CARE_PROVIDER_SITE_OTHER)

## 2024-01-18 DIAGNOSIS — E782 Mixed hyperlipidemia: Secondary | ICD-10-CM

## 2024-01-18 LAB — LIPID PANEL
Cholesterol: 224 mg/dL — ABNORMAL HIGH (ref 0–200)
HDL: 58.5 mg/dL (ref >39.00)
LDL Cholesterol: 143 mg/dL — ABNORMAL HIGH (ref 0–99)
NonHDL: 165.87
Total CHOL/HDL Ratio: 4
Triglycerides: 112 mg/dL (ref 0.0–149.0)
VLDL: 22.4 mg/dL (ref 0.0–40.0)

## 2024-01-24 NOTE — Progress Notes (Signed)
 Cardiology Office Note:  .   Date:  01/25/2024  ID:  Nancy [name], DOB 1955/12/24, MRN [id number] PCP: Nancy Comer POUR, NP  [facility] Providers Cardiologist:  Nancy JAYSON Maxcy, MD -- NEW   History of Present Illness: .   Nancy [name] is a 68 y.o. female  with PMHx of CAD s/p PCI mLAD 06/2022, HTN, HLD, Left carotid Stenosis (Carotid US  06/2022: 1-39 % stenosis in left ICA), alpha gal, family history of CAD who reports to [name] office for follow up.   Last seen in heartcare 07/26/2023 by Nancy Pavy, PA-C.  Reported leg weakness and aching at night. Patient suspects related to Praluent  since worsening after every shot.  Discontinue Praluent  and started Nexletol . Otherwise denied any other cardiac complaints.  Also noted that patient wanted to discontinue Bystolic  due to concerns of drop in her BP and requested to start metoprolol .  Ordered Toprol  XL 12.5 mg daily.  Follow-up labs 07/2023 showed BMP WNL except K 5.5, CBC WNL, and FLP with LDL 115.  In the setting of elevated potassium, patient denied taking OTC potassium or multivitamins and reviewed potassium rich foods to limit.  Ordered repeat BMP PT in 1 week, which was not completed.  Also, referred to lipid clinic but no appointments per chart review.  Seen by Lipid Clinic 10/13/2023. Noted intolerances to Repatha , Statin, Praluent  and Zetia . Recommended only available options are Leqvio and bempedoic acid . Phone note 11/09/2023  states that patient agreed to start Leqvio, however, no further follow up with Lipid clinic and Lequvio is not on medication list.   On interview, reports 1 single of palpitations about 1 week ago (fluttering, lasting for seconds, occurred while laying in bed). Patient states she is not sure if was her heart due to history of indigestion and anxiety. Denies CP, SOB, orthopnea, PND, edema, syncope. Patient did previously agree to start Leqvio but was very concerned about muscle ache AE and started Welchol   (colesevelam  HCl) by PCP. However, LDL was still above goal at 143 in most recent labs 01/2024. Reports compliance with medications. Reports heart healthy diet and avoiding dairy, sugar and red meats. 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 She also walks for 30 mins 4-5x per week. Denies tobacco use/alcohol/drug use. Denies any recent hospitalizations or visits to the emergency department.   ROS: 10 point review of system has been reviewed and considered negative except ones been listed in the HPI.   Studies Reviewed: .   Carotid US  06/2022 Summary:  Right Carotid: The extracranial vessels were near-normal with only minimal  wall                thickening or plaque.   Left Carotid: Velocities in the left ICA are consistent with a 1-39%  stenosis.   Vertebrals: Bilateral vertebral arteries demonstrate antegrade flow.  Subclavians: Normal flow hemodynamics were seen in bilateral subclavian               arteries.   Cath 06/2022 1. Severe single vessel CAD with tandem 50% and 80% lesions in the mid-LAD, treated successfully with a 2.75x28 mm Synergy DES post-dilated to high pressure with a 3.0 mm [place] balloon 2. Nonobstructive RCA stenosis 3. Patent left main and LCx without significant stenosis   Recommend overnight observation because of late in day procedure time. ASA/clopidogrel  x 6 months minimum. GDMT/risk reduction measures for secondary prevention.  ECHO  IMPRESSIONS 06/2022  1. Left  ventricular ejection fraction, by estimation, is 60 to 65%. The  left ventricle has normal function. The left ventricle has no regional  wall motion abnormalities. There is mild left ventricular hypertrophy.  Left ventricular diastolic parameters  were normal.   2. Right ventricular systolic function is normal. The right ventricular  size is normal. There is mildly elevated pulmonary artery systolic  pressure. The estimated right ventricular systolic pressure is 40.2  mmHg.   3. The mitral valve is grossly normal. Mild mitral valve regurgitation.  No evidence of mitral stenosis.   4. Tricuspid valve regurgitation is mild to moderate.   5. The aortic valve is tricuspid. There is mild calcification of the  aortic valve. Aortic valve regurgitation is not visualized. No aortic  stenosis is present.   6. The inferior vena cava is normal in size with greater than 50%  respiratory variability, suggesting right atrial pressure of 3 mmHg.   Physical Exam:   VS:  BP 116/76   Pulse 66   Ht 5' 2.5 (1.588 m)   Wt 153 lb (69.4 kg)   SpO2 97%   BMI 27.54 kg/m    Wt Readings from Last 3 Encounters:  01/25/24 153 lb (69.4 kg)  01/10/24 156 lb (70.8 kg)  11/01/23 156 lb (70.8 kg)    GEN: Well nourished, well developed in no acute distress while sitting in chair.  NECK: No JVD; No carotid bruits CARDIAC: RRR, no murmurs, rubs, gallops RESPIRATORY:  Clear to auscultation without rales, wheezing or rhonchi  ABDOMEN: Soft, non-tender, non-distended EXTREMITIES:  No edema; No deformity   ASSESSMENT AND PLAN: .   Coronary artery disease involving native coronary artery of native heart without angina pectoris Hyperlipidemia LDL goal <70 CTA with FFR 8/89/7975: Distal LAD: 0.69; high likelihood of hemodynamic significance.  Cath 06/30/2022: 50% and 80% stenosis in mLAD treated with DES; nonobstructive RCA, and patent LM/Lcx w/o significant stenosis. Recommended ASA/Plavix  for 6 months minimum and GDMT/risk reduction. D/C Plavix  07/27/2023.  ECHO 07/13/2022: EF 60 to 65%, mild LVH, mildly elevated PASP Last OV 07/2023: Reported leg weakness and aching at night. Patient suspects related to Praluent  since worsening after every shot.  Discontinued Praluent  and started Nexletol .  Patient did previously agree to start Leqvio but was very concerned about muscle ache AE and started Welchol  (colesevelam  HCl) by PCP. However, LDL was still above goal at 143 in most recent labs  01/2024 and d/c Welchol . Patient is initially not interested in starting Leqvio but after further explanation of MOA and SE, patient agrees to start. Patient also expressed interest in Crestor , however previously noted intolerance on 10 mg and discouraged use of Crestor .   Patient discussed [place] with on site Pharmacist [place] and decided to start Nexletol  while waiting for [facility] paperwork to process and determine if cost is reasonable. Given Nexletol  108 mg daily 30 day sample and sent to pharmacy as well.  Denies any anginal symptoms. No need for further ischemic evaluation at this time.  Continue on ASA 81 mg, Toprol  XL 12.5 mg and NTG PRN.   Essential hypertension Last OV 07/2023: patient wanted to discontinue Bystolic  due to concerns of drop in her BP and requested to start metoprolol .  Reports well controlled Home BP: 110-120's/70's BP this OV well controlled today: 116/76 Continue on Toprol  XL as above.  Encourage physical activity for 150 minutes per week and heart healthy low sodium diet. Discussed limiting sodium intake to < 2 grams daily.     Hyperkalemia  OV Follow-up labs 07/2023 showed BMP WNL except K 5.5.  In the setting of elevated potassium, patient denied taking OTC potassium or multivitamins and reviewed potassium rich foods to limit.  Ordered repeat BMP PT in 1 week, which was not completed.   Patient is not on any meds that elevate K.  Order BMP.   Left Carotid Stenosis  Carotid US  06/2022: 1-39 % stenosis in left ICA No bruits on exam Continue to monitor  Valvular Disease  ECHO 07/13/2022: mild MV regurgitation, mild to moderate TV regurgitation, mild AV calcification without AS Order follow up ECHO.     Dispo: Recommended Dr. Mona as primary cardiologist since previous Dr. Alveta retired. Patient agree. Follow up with Dr. Mona in 4 months.   Signed, Lorette CINDERELLA Kapur, PA-C

## 2024-01-25 ENCOUNTER — Ambulatory Visit: Attending: Physician Assistant | Admitting: Physician Assistant

## 2024-01-25 ENCOUNTER — Telehealth: Payer: Self-pay | Admitting: Pharmacy Technician

## 2024-01-25 ENCOUNTER — Encounter: Payer: Self-pay | Admitting: Physician Assistant

## 2024-01-25 VITALS — BP 116/76 | HR 66 | Ht 62.5 in | Wt 153.0 lb

## 2024-01-25 DIAGNOSIS — E785 Hyperlipidemia, unspecified: Secondary | ICD-10-CM | POA: Diagnosis not present

## 2024-01-25 DIAGNOSIS — I6522 Occlusion and stenosis of left carotid artery: Secondary | ICD-10-CM | POA: Diagnosis not present

## 2024-01-25 DIAGNOSIS — I10 Essential (primary) hypertension: Secondary | ICD-10-CM | POA: Diagnosis not present

## 2024-01-25 DIAGNOSIS — E875 Hyperkalemia: Secondary | ICD-10-CM | POA: Diagnosis not present

## 2024-01-25 DIAGNOSIS — I38 Endocarditis, valve unspecified: Secondary | ICD-10-CM

## 2024-01-25 DIAGNOSIS — I2510 Atherosclerotic heart disease of native coronary artery without angina pectoris: Secondary | ICD-10-CM

## 2024-01-25 LAB — BASIC METABOLIC PANEL WITH GFR
BUN/Creatinine Ratio: 14 (ref 12–28)
BUN: 10 mg/dL (ref 8–27)
CO2: 25 mmol/L (ref 20–29)
Calcium: 9.3 mg/dL (ref 8.7–10.3)
Chloride: 101 mmol/L (ref 96–106)
Creatinine, Ser: 0.7 mg/dL (ref 0.57–1.00)
Glucose: 82 mg/dL (ref 70–99)
Potassium: 4.8 mmol/L (ref 3.5–5.2)
Sodium: 138 mmol/L (ref 134–144)
eGFR: 94 mL/min/1.73 (ref >59)

## 2024-01-25 MED ORDER — NEXLETOL 180 MG PO TABS: 1.0000 | ORAL_TABLET | Freq: Every day | ORAL | Status: DC

## 2024-01-25 MED ORDER — NEXLETOL 180 MG PO TABS: 1.0000 | ORAL_TABLET | Freq: Every day | ORAL | 3 refills | Status: DC

## 2024-01-25 NOTE — Telephone Encounter (Signed)
   Pharmacy Patient Advocate Encounter   Received notification from Onbase that prior authorization for NEXLETOL  is required/requested.   Insurance verification completed.   The patient is insured through [facility] ADVANTAGE/RX ADVANCE .   Per test claim: PA required; PA submitted to above mentioned insurance via Latent Key/confirmation #/EOC [id number] Status is pending   Pharmacy Patient Advocate Encounter  Received notification from [facility] ADVANTAGE/RX ADVANCE that Prior Authorization for NEXLETOL  has been APPROVED from 01/25/24 to 07/23/24   PA #/Case ID/Reference #: [id number]

## 2024-01-25 NOTE — Patient Instructions (Addendum)
 Medication Instructions:  Start Nexletol  180 mg daily *If you need a refill on your cardiac medications before your next appointment, please call your pharmacy*  Lab Work: BMP-TODAY If you have labs (blood work) drawn today and your tests are completely normal, you will receive your results only by: MyChart Message (if you have MyChart) OR A paper copy in the mail If you have any lab test that is abnormal or we need to change your treatment, we will call you to review the results.  Testing/Procedures: Your physician has requested that you have an echocardiogram. Echocardiography is a painless test that uses sound waves to create images of your heart. It provides your doctor with information about the size and shape of your heart and how well your heart's chambers and valves are working. This procedure takes approximately one hour. There are no restrictions for this procedure. Please do NOT wear cologne, perfume, aftershave, or lotions (deodorant is allowed). Please arrive 15 minutes prior to your appointment time.  Please note: We ask at that you not bring children with you during ultrasound (echo/ vascular) testing. Due to room size and safety concerns, children are not allowed in the ultrasound rooms during exams. Our front office staff cannot provide observation of children in our lobby area while testing is being conducted. An adult accompanying a patient to their appointment will only be allowed in the ultrasound room at the discretion of the ultrasound technician under special circumstances. We apologize for any inconvenience.   Follow-Up: At [facility], you and your health needs are our priority.  As part of our continuing mission to provide you with exceptional heart care, our providers are all part of one team.  This team includes your primary Cardiologist (physician) and Advanced Practice Providers or APPs (Physician Assistants and Nurse Practitioners) who all work together to  provide you with the care you need, when you need it.  Your next appointment:   4 month(s)  Provider:   [name], MD

## 2024-01-27 ENCOUNTER — Ambulatory Visit: Payer: Self-pay | Admitting: Physician Assistant

## 2024-02-01 NOTE — Telephone Encounter (Signed)
 Letter of results sent to pt

## 2024-02-16 ENCOUNTER — Encounter (HOSPITAL_COMMUNITY): Payer: Self-pay | Admitting: Emergency Medicine

## 2024-02-16 ENCOUNTER — Ambulatory Visit (INDEPENDENT_AMBULATORY_CARE_PROVIDER_SITE_OTHER)

## 2024-02-16 ENCOUNTER — Ambulatory Visit (HOSPITAL_COMMUNITY)
Admission: EM | Admit: 2024-02-16 | Discharge: 2024-02-16 | Disposition: A | Discharge status: Home / Self Care | Attending: Family Medicine | Admitting: Family Medicine

## 2024-02-16 DIAGNOSIS — S63501A Unspecified sprain of right wrist, initial encounter: Secondary | ICD-10-CM

## 2024-02-16 DIAGNOSIS — M19031 Primary osteoarthritis, right wrist: Secondary | ICD-10-CM | POA: Diagnosis not present

## 2024-02-16 DIAGNOSIS — S6991XA Unspecified injury of right wrist, hand and finger(s), initial encounter: Secondary | ICD-10-CM | POA: Diagnosis not present

## 2024-02-16 DIAGNOSIS — M19041 Primary osteoarthritis, right hand: Secondary | ICD-10-CM | POA: Diagnosis not present

## 2024-02-16 DIAGNOSIS — M25531 Pain in right wrist: Secondary | ICD-10-CM | POA: Diagnosis not present

## 2024-02-16 NOTE — ED Provider Notes (Signed)
 [facility]    CSN: [id number] Arrival date & time: 02/16/24  1549      History   Chief Complaint Chief Complaint  Patient presents with   Arm Injury    HPI Nancy [name] is a 68 y.o. female.   Nancy [name] is a 68 y.o. female presenting for chief complaint of injury to the right arm/right wrist that happened today while she was using a saw.  The saw jolted back and caused her right wrist to supinate quickly.  She is experiencing bruising to the dorsum of the ulnar portion of the right hand and pain/swelling to the right first MCP.  Denies previous injuries to the right hand/wrist, paresthesias distally, and open wounds as a result of injury.  Range of motion activity of the wrist with flexion and extension as well as ulnar and radial deviation elicits pain.  She has not attempted treatment of pain prior to arrival.     Past Medical History:  Diagnosis Date   Allergy 04/12/2020   Alpha gal syndrome   Allergy to alpha-gal    Arthritis    Breast cancer (HCC) 10/23/2020   CAD (coronary artery disease)    Cataract    Chronic neck pain 04/11/2018   Coronary artery calcification seen on CAT scan    Eczema    Essential hypertension 07/20/2019   Fatigue    GAD (generalized anxiety disorder) 06/26/2018   GERD (gastroesophageal reflux disease)    Heart murmur ?   As a child   Hyperkalemia 07/27/2023   Hyperlipidemia    Lichen sclerosus    Overweight (BMI 25.0-29.9) 01/09/2018   Recurrent upper respiratory infection (URI)    Routine general medical examination at a health care facility    Tick bite of left shoulder 09/29/2021   Urinary frequency 06/06/2019   Urticaria    Vaginal itching 06/06/2019    Patient Active Problem List   Diagnosis Date Noted   Lower extremity pain, bilateral 07/27/2023   Osteoarthritis of right hip 07/27/2023   Chronic congestion of paranasal sinus 08/31/2022   CAD (coronary artery disease) 07/01/2022   Angina pectoris,  unspecified (HCC) 06/30/2022   Allergy to alpha-gal 08/20/2021   Prediabetes 08/20/2021   Vitamin B 12 deficiency 08/20/2021   Vertigo 03/29/2021   Paresthesia 03/29/2021   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   Positive colorectal cancer screening using Cologuard test    Polyp of sigmoid colon    Polyp of transverse colon    Preventative health care 08/06/2020   Allergic rhinitis 07/10/2020   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   Other polyp of sinus 07/10/2020   Full body hives 05/26/2020   Essential hypertension 07/20/2019   Lichen sclerosus 06/06/2019   GAD (generalized anxiety disorder) 06/26/2018   Chronic neck pain 04/11/2018   Overweight (BMI 25.0-29.9) 01/09/2018   Hyperlipidemia 08/25/2010    Past Surgical History:  Procedure Laterality Date   ABDOMINAL HYSTERECTOMY  1992   Partial   ANTERIOR CERVICAL DECOMP/DISCECTOMY FUSION     BREAST LUMPECTOMY WITH RADIOACTIVE SEED LOCALIZATION Left 11/14/2020   Procedure: LEFT BREAST LUMPECTOMY WITH RADIOACTIVE SEED LOCALIZATION;  Surgeon: Vanderbilt Ned, MD;  Location: [facility];  Service: General;  Laterality: Left;   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   CESAREAN SECTION     1982 and 1983   COLONOSCOPY WITH PROPOFOL  N/A 10/06/2020   Procedure: COLONOSCOPY WITH PROPOFOL ;  Surgeon:  Janalyn Keene NOVAK, MD;  Location: [facility];  Service: Endoscopy;  Laterality: N/A;   CORONARY STENT INTERVENTION N/A 06/30/2022   Procedure: CORONARY STENT INTERVENTION;  Surgeon: Wonda Sharper, MD;  Location: [facility]VASIVE CV LAB;  Service: Cardiovascular;  Laterality: N/A;   EYE SURGERY  2024   Cataracts   LEFT HEART CATH AND CORONARY ANGIOGRAPHY N/A 06/30/2022   Procedure: LEFT HEART CATH AND CORONARY ANGIOGRAPHY;  Surgeon: Wonda Sharper, MD;  Location: [facility] INVASIVE CV LAB;  Service: Cardiovascular;  Laterality: N/A;   PARTIAL HYSTERECTOMY     SPINE SURGERY  1992   2ruptured discs    TUBAL LIGATION  1985    OB History   No obstetric history on file.      Home Medications    Prior to Admission medications   Medication Sig Start Date End Date Taking? Authorizing Provider  aspirin  (ASPIRIN  81) 81 MG chewable tablet  06/30/22   [provider]  aspirin  EC 81 MG tablet Take 1 tablet (81 mg total) by mouth daily. 06/24/22   Dunn, Dayna N, PA-C  Bempedoic Acid  (NEXLETOL ) 180 MG TABS Take 1 tablet (180 mg total) by mouth daily. 01/25/24   Sheron Lorette GRADE, PA-C  Bempedoic Acid  (NEXLETOL ) 180 MG TABS Take 1 tablet (180 mg total) by mouth daily. 01/25/24   Meng, Hao, PA  Cholecalciferol (VITAMIN D-3 PO) Take 1 tablet by mouth 3 (three) times a week.    [provider]  citalopram  (CELEXA ) 20 MG tablet Take 1 tablet (20 mg total) by mouth daily. For anxiety. 12/06/23   Clark, Katherine K, NP  clobetasol cream (TEMOVATE) 0.05 % Apply 1 application  topically daily. 07/18/19   [provider]  colesevelam  (WELCHOL ) 625 MG tablet Take 3 tablets (1,875 mg total) by mouth 2 (two) times daily with a meal. for cholesterol. 11/18/23 02/16/24  Clark, Katherine K, NP  Cyanocobalamin  (B-12) 1000 MCG CAPS Take 1 tablet by mouth 3 (three) times a week. 08/08/20   [provider]  EPINEPHRINE  0.3 mg/0.3 mL IJ SOAJ injection INJECT 0.3 MG INTO THE MUSCLE AS NEEDED FOR ANAPHYLAXIS. 12/04/23   Clark, Katherine K, NP  metoprolol  succinate (TOPROL  XL) 25 MG 24 hr tablet Take 0.5 tablets (12.5 mg total) by mouth daily. 07/26/23   Parthenia Olivia HERO, PA-C  nitroGLYCERIN  (NITROLINGUAL ) 0.4 MG/SPRAY spray Place 1 spray under the tongue every 5 (five) minutes as needed for chest pain. 07/01/22   Goodrich, Callie E, PA-C  tacrolimus (PROTOPIC) 0.1 % ointment APPLY A THIN LAYER TO THE AFFECTED AREA(S) BY TOPICAL ROUTE 2 TIMES PER DAY ; RUB IN GENTLY AND COMPLETELY Patient not taking: Reported on 12/03/2023 10/25/23   [provider]    Family History Family History   Problem Relation Age of Onset   Hydrocephalus Mother    Neuropathy Mother    Anxiety disorder Mother    Cancer Mother    Miscarriages / [place] Mother    Diabetes Father    Hypertension Father    Thyroid  disease Maternal Aunt    Thyroid  cancer Cousin    Diabetes Son    Diabetes Son    Colon cancer Neg Hx    Rectal cancer Neg Hx    Stomach cancer Neg Hx    Esophageal cancer Neg Hx    Colon polyps Neg Hx     Social History Social History   Tobacco Use   Smoking status: Never   Smokeless tobacco: Never  Vaping Use  Vaping status: Never Used  Substance Use Topics   Alcohol use: Not Currently    Alcohol/week: 0.0 standard drinks of alcohol   Drug use: No     Allergies   Alpha-gal, Other, Macrobid  [nitrofurantoin ], Pravastatin , Repatha  [evolocumab ], Rosuvastatin , Shellfish allergy, Simvastatin , Atorvastatin , Lisinopril , Milk protein, Nitroglycerin , Statins, and Zetia  [ezetimibe ]   Review of Systems Review of Systems Per HPI  Physical Exam Triage Vital Signs ED Triage Vitals  Encounter Vitals Group     BP 02/16/24 1722 (!) 158/75     Girls Systolic BP Percentile --      Girls Diastolic BP Percentile --      Boys Systolic BP Percentile --      Boys Diastolic BP Percentile --      Pulse Rate 02/16/24 1722 63     Resp 02/16/24 1722 17     Temp 02/16/24 1722 98.2 F (36.8 C)     Temp Source 02/16/24 1722 Oral     SpO2 02/16/24 1722 97 %     Weight --      Height --      Head Circumference --      Peak Flow --      Pain Score 02/16/24 1721 7     Pain Loc --      Pain Education --      Exclude from Growth Chart --    No data found.  Updated Vital Signs BP (!) 158/75 (BP Location: Left Arm)   Pulse 63   Temp 98.2 F (36.8 C) (Oral)   Resp 17   SpO2 97%   Visual Acuity Right Eye Distance:   Left Eye Distance:   Bilateral Distance:    Right Eye Near:   Left Eye Near:    Bilateral Near:     Physical Exam Vitals and nursing note reviewed.   Constitutional:      Appearance: She is not ill-appearing or toxic-appearing.  HENT:     Head: Normocephalic and atraumatic.     Right Ear: Hearing and external ear normal.     Left Ear: Hearing and external ear normal.     Nose: Nose normal.     Mouth/Throat:     Lips: Pink.  Eyes:     General: Lids are normal. Vision grossly intact. Gaze aligned appropriately.     Extraocular Movements: Extraocular movements intact.     Conjunctiva/sclera: Conjunctivae normal.  Pulmonary:     Effort: Pulmonary effort is normal.  Musculoskeletal:     Right wrist: Swelling, tenderness and bony tenderness present. No deformity, effusion, lacerations, snuff box tenderness or crepitus. Decreased range of motion. Normal pulse.     Left wrist: Normal.     Right hand: Swelling (Scant soft tissue swelling over the ulnar portion of the dorsum of the right hand with minimal bruising, see image below.), tenderness and bony tenderness present. No deformity or lacerations. Normal range of motion. Normal strength. Normal sensation. There is no disruption of two-point discrimination. Normal capillary refill. Normal pulse.     Left hand: Normal.     Cervical back: Neck supple.     Comments: 5/5 grip strength bilateral upper extremities.  Sensation intact to bilateral distal hands.  Less than 2 cap refill.  Skin:    General: Skin is warm and dry.     Capillary Refill: Capillary refill takes less than 2 seconds.     Findings: No rash.  Neurological:     General: No focal deficit present.  Mental Status: She is alert and oriented to person, place, and time. Mental status is at baseline.     Cranial Nerves: No dysarthria or facial asymmetry.  Psychiatric:        Mood and Affect: Mood normal.        Speech: Speech normal.        Behavior: Behavior normal.        Thought Content: Thought content normal.        Judgment: Judgment normal.      UC Treatments / Results  Labs (all labs ordered are listed, but  only abnormal results are displayed) Labs Reviewed - No data to display  EKG   Radiology DG Hand Complete Right Result Date: 02/16/2024 CLINICAL DATA:  injury after saw kicked back EXAM: RIGHT HAND - COMPLETE 3+ VIEW COMPARISON:  None Available. FINDINGS: No acute fracture or dislocation. Mild-to-moderate joint space loss involving the distal interphalangeal joints throughout the hand. Soft tissues are unremarkable. No radiopaque foreign body. IMPRESSION: 1. No acute fracture or dislocation. 2. 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 Electronically Signed   By: Rogelia Myers M.D.   On: 02/16/2024 18:22   DG Wrist Complete Right Result Date: 02/16/2024 CLINICAL DATA:  injury after saw kicked back, right wrist and hand pain EXAM: RIGHT WRIST - COMPLETE 3+ VIEW COMPARISON:  None Available. FINDINGS: No acute fracture or dislocation. Mild joint space loss of the first carpometacarpal and triscaphe joints. Soft tissues are unremarkable. IMPRESSION: 1. No acute fracture or dislocation. 2. Mild osteoarthritis at the base of the thumb. Electronically Signed   By: Rogelia Myers M.D.   On: 02/16/2024 18:21    Procedures Procedures (including critical care time)  Medications Ordered in UC Medications - No data to display  Initial Impression / Assessment and Plan / UC Course  I have reviewed the triage vital signs and the nursing notes.  Pertinent labs & imaging results that were available during my care of the patient were reviewed by me and considered in my medical decision making (see chart for details).   1.  Sprain of right wrist Imaging is negative for acute fracture or dislocation. We will manage this with conservative treatment as an acute sprain to the hand/wrist.  RICE advised.   Thumb spica applied in clinic and patient may use this as needed for compression and stability.  May use Tylenol  as needed for pain and swelling at home.  Walking referral  to orthopedics given should symptoms fail to improve in the next few weeks with conservative care.   Counseled patient on potential for adverse effects with medications prescribed/recommended today, strict ER and return-to-clinic precautions discussed, patient verbalized understanding.    Final Clinical Impressions(s) / UC Diagnoses   Final diagnoses:  Sprain of right wrist, initial encounter     Discharge Instructions      Your x-rays of your hand/wrist were negative for fracture or dislocation.   Wear the brace we provided in the clinic for the next couple of weeks to provide compression, stability, and comfort.  Please rest, ice, and elevate your injury to help it heal and decrease inflammation.   1,000mg  tylenol  every 6 hours as needed for pain and inflammation. Take with food to avoid stomach upset.  Call the orthopedic provider listed on your discharge paperwork to schedule a follow-up appointment if your symptoms do not improve in the next 1-2 weeks with supportive care.  Return to urgent care if you  experience worsening pain, numbness, tingling, change of color in your skin near the injury, or any other concerning symptoms.  I hope you feel better!      ED Prescriptions   None    PDMP not reviewed this encounter.   Enedelia Dorna HERO, [place] 02/16/24 [id number]

## 2024-02-16 NOTE — Progress Notes (Unsigned)
 Patient ID: [name]                 DOB: 1956/03/12                    MRN: [id number]      HPI: Nancy [name] is a 68 y.o. female patient referred to lipid clinic by [name], PA- . PMH is significant for CAD, angina, HTN, HLD, osteoarthritis, GAD.  Patient had previously been followed by the Lipid Clinic.at last visit with Pharm.D. Leqvio or Nexletol  were discussed.  Patient was hesitant about either option.  She had agreed to try Leqvio but then changed her mind.  She wanted to try WelChol  despite being educated that this would not be the best option for her as it does not provide a good LDL-C reduction.  She eventually tried WelChol  with a reduction in her LDL-C 143.  She tried the Nexletol  but had eyelid swelling.  She also found out that it had lactose in it.  Patient has alpha gal and asked to avoid all products derived from mammals.   Today presents to Lipid Clinic. Patient brings up that she has Alpha-gal syndrome. With her research, she has found that the medications she has tried is mammalian derived.   Given her intolerances, she is unsure what other medications are available for her. Currently on Welchol  and has not had any allergic reaction type reactions. Wants to avoid prior medications if possible and continue Welchol .  Current Medications: Colesevelam  Intolerances: Repatha  - Back aches, headache, sore throat , simvastatin , atorvastatin , Crestor  -Myalgias on 10mg  daily and 5mg  2x/week dosing, pravastatin  - 10-20mg  - myalgias, Praluent  - myalgia - tried twice, Zetia  10 mg - myalgia Risk Factors: CAD, angina, HTN, HLD, osteoarthritis, GAD LDL-C goal: <70 mg/dL ApoB goal:   Diet: eats 2 meals a day Dietary restrictions due to allergies and alpha-gal syndrome Mainly healthy eating: chicken- roasted, grilled  Exercise: 50-60 leg lifts in the morning, walk for 30 mins 4-5x/week. Add some weight training into  Family History:  Family History  Problem Relation Age of  Onset   Hydrocephalus Mother    Neuropathy Mother    Anxiety disorder Mother    Cancer Mother    Miscarriages / Stillbirths Mother    Diabetes Father    Hypertension Father    Thyroid  disease Maternal Aunt    Thyroid  cancer Cousin    Diabetes Son    Diabetes Son    Colon cancer Neg Hx    Rectal cancer Neg Hx    Stomach cancer Neg Hx    Esophageal cancer Neg Hx    Colon polyps Neg Hx     Social History:  No tobacco No alcohol  Labs: Lipid Panel from 01/18/24 Chol 224 TG 112 HDL 58.5 LDL 143     Component Value Date/Time   CHOL 224 (H) 01/18/2024 0902   CHOL 214 (H) 07/26/2023 1119   TRIG 112.0 01/18/2024 0902   HDL 58.50 01/18/2024 0902   HDL 78 07/26/2023 1119   CHOLHDL 4 01/18/2024 0902   VLDL 22.4 01/18/2024 0902   LDLCALC 143 (H) 01/18/2024 0902   LDLCALC 115 (H) 07/26/2023 1119   LDLDIRECT 197.5 11/02/2011 0819   LABVLDL 21 07/26/2023 1119    Past Medical History:  Diagnosis Date   Allergy 04/12/2020   Alpha gal syndrome   Allergy to alpha-gal    Arthritis    Breast cancer (HCC) 10/23/2020   CAD (coronary artery  disease)    Cataract    Chronic neck pain 04/11/2018   Coronary artery calcification seen on CAT scan    Eczema    Essential hypertension 07/20/2019   Fatigue    GAD (generalized anxiety disorder) 06/26/2018   GERD (gastroesophageal reflux disease)    Heart murmur ?   As a child   Hyperkalemia 07/27/2023   Hyperlipidemia    Lichen sclerosus    Overweight (BMI 25.0-29.9) 01/09/2018   Recurrent upper respiratory infection (URI)    Routine general medical examination at a health care facility    Tick bite of left shoulder 09/29/2021   Urinary frequency 06/06/2019   Urticaria    Vaginal itching 06/06/2019    Current Outpatient Medications on File Prior to Visit  Medication Sig Dispense Refill   aspirin  (ASPIRIN  81) 81 MG chewable tablet      aspirin  EC 81 MG tablet Take 1 tablet (81 mg total) by mouth daily.     Cholecalciferol  (VITAMIN D-3 PO) Take 1 tablet by mouth 3 (three) times a week.     citalopram  (CELEXA ) 20 MG tablet Take 1 tablet (20 mg total) by mouth daily. For anxiety. 90 tablet 2   clobetasol cream (TEMOVATE) 0.05 % Apply 1 application  topically daily.     Cyanocobalamin  (B-12) 1000 MCG CAPS Take 1 tablet by mouth 3 (three) times a week.     EPINEPHRINE  0.3 mg/0.3 mL IJ SOAJ injection INJECT 0.3 MG INTO THE MUSCLE AS NEEDED FOR ANAPHYLAXIS. 2 each 0   metoprolol  succinate (TOPROL  XL) 25 MG 24 hr tablet Take 0.5 tablets (12.5 mg total) by mouth daily. 45 tablet 3   nitroGLYCERIN  (NITROLINGUAL ) 0.4 MG/SPRAY spray Place 1 spray under the tongue every 5 (five) minutes as needed for chest pain. 12 g 2   tacrolimus (PROTOPIC) 0.1 % ointment APPLY A THIN LAYER TO THE AFFECTED AREA(S) BY TOPICAL ROUTE 2 TIMES PER DAY ; RUB IN GENTLY AND COMPLETELY (Patient not taking: Reported on 12/03/2023)     No current facility-administered medications on file prior to visit.    Allergies  Allergen Reactions   Alpha-Gal Anaphylaxis, Hives, Itching and Rash   Other Other (See Comments)    Red Meats, Milk, Shellfish   Macrobid  [Nitrofurantoin ] Other (See Comments)    Hypoglycemia   Pravastatin  Other (See Comments)    10-20mg  - myalgias   Repatha  [Evolocumab ] Other (See Comments)    Back aches, headache, sore throat   Rosuvastatin  Other (See Comments)    Myalgias on 10mg  daily and 5mg  2x/week dosing   Shellfish Allergy Other (See Comments)    Alph gal    Simvastatin  Other (See Comments)    Myalgias on 20mg  daily dosing   Nexletol  [Bempedoic Acid ] Swelling    Eye lid swelling   Atorvastatin  Other (See Comments)    Myalgias on 20mg  daily dosing   Lisinopril  Other (See Comments)    Not allergy, intolerance: headaches   Milk Protein Other (See Comments)    Allergy testing   Nitroglycerin  Other (See Comments)    Not allergic, but needs premedication to use this as it contains bovine components (alpha gal allergy)    Statins Other (See Comments)    Myalgias    Zetia  [Ezetimibe ] Other (See Comments)    Myalgias    Assessment/Plan:  1. Hyperlipidemia -  Hyperlipidemia This is assessment: LDL-C is currently not at goal <70 mg/dL Last LDL-C 856 mg/dL on 06/16/72 on Welchol  Patient is intolerant to majority  of lipid medications with alpha-gal syndrome and myalgias. Reports significant muscle pains on Repatha , Praluent , rosuvastatin , pravastatin , atorvastatin  and Zetia .  Statins even tried at low doses. Currently on Welchol  with no issues.  She was educated that this medication has no cardiovascular risk reduction data and may not be worth her taking as it most likely will not prevent a heart attack and stroke. She is afraid to take Leqvio due to the fact that it is every 6 months, concerned that she would have a side effect for a long period of time.  She was educated that mechanistically is only in her bloodstream for 48 hours.  Her insurance also does not cover well and could be costly.  May be options to get a grant to help with cost but patient ultimately decided she is not interested in trying.  Plan: Patient with very limited medication options Patient refuses Leqvio She has tried many statins and many different doses Intolerant to Zetia  and Nexletol  Despite being educated that there is no cardiovascular risk reduction with WelChol , patient wishes to continue.  Since there are really no other options for her to try with that she is willing to try we will continue WelChol .     Jenkins Graces, PharmD PGY1 Pharmacy Resident [phone]   Thank you,  Melissa D Maccia, Pharm.Nancy [name], CPP [facility] [address] [place], [place] [address]  Phone: [phone]; Fax: [phone]

## 2024-02-16 NOTE — Discharge Instructions (Signed)
 Your x-rays of your hand/wrist were negative for fracture or dislocation.   Wear the brace we provided in the clinic for the next couple of weeks to provide compression, stability, and comfort.  Please rest, ice, and elevate your injury to help it heal and decrease inflammation.   1,000mg  tylenol  every 6 hours as needed for pain and inflammation. Take with food to avoid stomach upset.  Call the orthopedic provider listed on your discharge paperwork to schedule a follow-up appointment if your symptoms do not improve in the next 1-2 weeks with supportive care.  Return to urgent care if you experience worsening pain, numbness, tingling, change of color in your skin near the injury, or any other concerning symptoms.  I hope you feel better!

## 2024-02-16 NOTE — ED Triage Notes (Signed)
 Pt reports working with a saw when kicked back and caused her to twist her right wrist. Patient c/o pain in right wrist and palm of hand. Reports pain worse with any movement. Hasn't taken any medications for pain.

## 2024-02-17 ENCOUNTER — Ambulatory Visit: Attending: Internal Medicine | Admitting: Pharmacist

## 2024-02-17 ENCOUNTER — Other Ambulatory Visit: Payer: Self-pay | Admitting: Primary Care

## 2024-02-17 DIAGNOSIS — E782 Mixed hyperlipidemia: Secondary | ICD-10-CM | POA: Diagnosis not present

## 2024-02-17 MED ORDER — COLESEVELAM HCL 625 MG PO TABS: 1875.0000 mg | ORAL_TABLET | Freq: Two times a day (BID) | ORAL | 0 refills | Status: DC

## 2024-02-17 NOTE — Assessment & Plan Note (Addendum)
 This is assessment: LDL-C is currently not at goal <70 mg/dL Last LDL-C 856 mg/dL on 06/16/72 on Welchol  Patient is intolerant to majority of lipid medications with alpha-gal syndrome and myalgias. Reports significant muscle pains on Repatha , Praluent , rosuvastatin , pravastatin , atorvastatin  and Zetia .  Statins even tried at low doses. Currently on Welchol  with no issues.  Nancy [name] was educated that this medication has no cardiovascular risk reduction data and may not be worth her taking as it most likely will not prevent a heart attack and stroke. Nancy [name] is afraid to take Leqvio due to the fact that it is every 6 months, concerned that Nancy [name] would have a side effect for a long period of time.  Nancy [name] was educated that mechanistically is only in her bloodstream for 48 hours.  Her insurance also does not cover well and could be costly.  May be options to get a grant to help with cost but patient ultimately decided Nancy [name] is not interested in trying.  Plan: Patient with very limited medication options Patient refuses Leqvio Nancy [name] has tried many statins and many different doses Intolerant to Zetia  and Nexletol  Despite being educated that there is no cardiovascular risk reduction with WelChol , patient wishes to continue.  Since there are really no other options for her to try with that Nancy [name] is willing to try we will continue WelChol .

## 2024-02-17 NOTE — Patient Instructions (Addendum)
 Continue taking Welchol  3 tablets twice a day

## 2024-02-28 ENCOUNTER — Ambulatory Visit (HOSPITAL_COMMUNITY)
Admission: RE | Admit: 2024-02-28 | Discharge: 2024-02-28 | Disposition: A | Discharge status: Home / Self Care | Source: Ambulatory Visit | Attending: Cardiovascular Disease | Admitting: Cardiovascular Disease

## 2024-02-28 DIAGNOSIS — I38 Endocarditis, valve unspecified: Secondary | ICD-10-CM | POA: Diagnosis not present

## 2024-02-28 LAB — ECHOCARDIOGRAM COMPLETE
Area-P 1/2: 3.56 cm2
S' Lateral: 2.4 cm

## 2024-05-25 ENCOUNTER — Encounter: Payer: Self-pay | Admitting: Internal Medicine

## 2024-05-25 ENCOUNTER — Ambulatory Visit: Attending: Internal Medicine | Admitting: Internal Medicine

## 2024-05-25 VITALS — BP 142/82 | HR 68 | Ht 62.0 in | Wt 148.0 lb

## 2024-05-25 DIAGNOSIS — E785 Hyperlipidemia, unspecified: Secondary | ICD-10-CM | POA: Diagnosis not present

## 2024-05-25 DIAGNOSIS — I10 Essential (primary) hypertension: Secondary | ICD-10-CM | POA: Diagnosis not present

## 2024-05-25 DIAGNOSIS — I6522 Occlusion and stenosis of left carotid artery: Secondary | ICD-10-CM | POA: Diagnosis not present

## 2024-05-25 DIAGNOSIS — I2510 Atherosclerotic heart disease of native coronary artery without angina pectoris: Secondary | ICD-10-CM | POA: Diagnosis not present

## 2024-05-25 DIAGNOSIS — E782 Mixed hyperlipidemia: Secondary | ICD-10-CM | POA: Diagnosis not present

## 2024-05-25 NOTE — Progress Notes (Signed)
 OFFICE NOTE  Chief Complaint:  Establish cardiologist  Primary Care Physician: [name], NP  HPI:  Nancy [name] is a 68 y.o. female with a past medial history significant for coronary artery disease status post PCI to the LAD in 2024.  She was previously followed by Dr. Alveta.  Unfortunately she has alpha gal syndrome and numerous medication intolerances including intolerances to statins, Nexletol , Repatha , Praluent , ezetimibe  and others.  She had been offered Leqvio through the lipid clinic but was confused about the potential risk of long-term side effects.  Although the medicine is dosed every 6 months it does not mean that it would have side effects for 6 months.  In fact the side effect profile is very well-tolerated.  She then had some concerns about medicines that we are produce such as the antibodies and Praluent  which were cultured and shows cells.  These are Chinese hamster ovary cells which are of mammalian origin, however the antibodies are removed and washed and there are no antigens in the injection so therefore she would not be getting any exposure to mammalian products.  She is only on WelChol .  Her cholesterol was elevated at LDL 143 in August.  Her target should be less than 70.  Blood pressure is also elevated today 142/82.  She is only on low-dose Toprol  XL 12.5 mg daily.  PMHx:  Past Medical History:  Diagnosis Date   Allergy 04/12/2020   Alpha gal syndrome   Allergy to alpha-gal    Arthritis    Breast cancer (HCC) 10/23/2020   CAD (coronary artery disease)    Cataract    Chronic neck pain 04/11/2018   Coronary artery calcification seen on CAT scan    Eczema    Essential hypertension 07/20/2019   Fatigue    GAD (generalized anxiety disorder) 06/26/2018   GERD (gastroesophageal reflux disease)    Heart murmur ?   As a child   Hyperkalemia 07/27/2023   Hyperlipidemia    Lichen sclerosus    Overweight (BMI 25.0-29.9) 01/09/2018   Recurrent upper  respiratory infection (URI)    Routine general medical examination at a health care facility    Tick bite of left shoulder 09/29/2021   Urinary frequency 06/06/2019   Urticaria    Vaginal itching 06/06/2019    Past Surgical History:  Procedure Laterality Date   ABDOMINAL HYSTERECTOMY  1992   Partial   ANTERIOR CERVICAL DECOMP/DISCECTOMY FUSION     BREAST LUMPECTOMY WITH RADIOACTIVE SEED LOCALIZATION Left 11/14/2020   Procedure: LEFT BREAST LUMPECTOMY WITH RADIOACTIVE SEED LOCALIZATION;  Surgeon: Vanderbilt Ned, MD;  Location: [facility];  Service: General;  Laterality: Left;   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   CESAREAN SECTION     1982 and 1983   COLONOSCOPY WITH PROPOFOL  N/A 10/06/2020   Procedure: COLONOSCOPY WITH PROPOFOL ;  Surgeon: Janalyn Keene NOVAK, MD;  Location: [facility];  Service: Endoscopy;  Laterality: N/A;   CORONARY STENT INTERVENTION N/A 06/30/2022   Procedure: CORONARY STENT INTERVENTION;  Surgeon: Wonda Sharper, MD;  Location: [facility] INVASIVE CV LAB;  Service: Cardiovascular;  Laterality: N/A;   EYE SURGERY  2024   Cataracts   LEFT HEART CATH AND CORONARY ANGIOGRAPHY N/A 06/30/2022   Procedure: LEFT HEART CATH AND CORONARY ANGIOGRAPHY;  Surgeon: Wonda Sharper, MD;  Location: [facility] INVASIVE CV LAB;  Service: Cardiovascular;  Laterality: N/A;   PARTIAL HYSTERECTOMY     SPINE SURGERY  1992   2ruptured discs  TUBAL LIGATION  1985    FAMHx:  Family History  Problem Relation Age of Onset   Hydrocephalus Mother    Neuropathy Mother    Anxiety disorder Mother    Cancer Mother    Miscarriages / Stillbirths Mother    Diabetes Father    Hypertension Father    Thyroid  disease Maternal Aunt    Thyroid  cancer Cousin    Diabetes Son    Diabetes Son    Colon cancer Neg Hx    Rectal cancer Neg Hx    Stomach cancer Neg Hx    Esophageal cancer Neg Hx    Colon polyps Neg Hx     SOCHx:   reports that she has never smoked. She has never used  smokeless tobacco. She reports that she does not currently use alcohol. She reports that she does not use drugs.  ALLERGIES:  Allergies[1]  ROS: Pertinent items noted in HPI and remainder of comprehensive ROS otherwise negative.  HOME MEDS: Medications Ordered Prior to Encounter[2]  LABS/IMAGING: No results found for this or any previous visit (from the past 48 hours). No results found.  LIPID PANEL:    Component Value Date/Time   CHOL 224 (H) 01/18/2024 0902   CHOL 214 (H) 07/26/2023 1119   TRIG 112.0 01/18/2024 0902   HDL 58.50 01/18/2024 0902   HDL 78 07/26/2023 1119   CHOLHDL 4 01/18/2024 0902   VLDL 22.4 01/18/2024 0902   LDLCALC 143 (H) 01/18/2024 0902   LDLCALC 115 (H) 07/26/2023 1119   LDLDIRECT 197.5 11/02/2011 0819    Lipoprotein (a)  Date/Time Value Ref Range Status  07/01/2022 02:24 AM 17.0 <75.0 nmol/L Final    Comment:    (NOTE) Note:  Values greater than or equal to 75.0 nmol/L may       indicate an independent risk factor for CHD,       but must be evaluated with caution when applied       to non-Caucasian populations due to the       influence of genetic factors on Lp(a) across       ethnicities. Performed At: [facility] [address] [place], [place] [id number] Jennette Shorter MD Ey:[phone]        WEIGHTS: Wt Readings from Last 3 Encounters:  05/25/24 148 lb (67.1 kg)  01/25/24 153 lb (69.4 kg)  01/10/24 156 lb (70.8 kg)    VITALS: BP (!) 142/82   Pulse 68   Ht 5' 2 (1.575 m)   Wt 148 lb (67.1 kg)   SpO2 97%   BMI 27.07 kg/m   EXAM: General appearance: alert and no distress Lungs: clear to auscultation bilaterally Heart: regular rate and rhythm, S1, S2 normal, no murmur, click, rub or gallop Extremities: extremities normal, atraumatic, no cyanosis or edema Neurologic: Grossly normal  EKG: EKG Interpretation Date/Time:  Friday May 25 2024 09:02:33 EST Ventricular Rate:  68 PR Interval:  134 QRS  Duration:  84 QT Interval:  410 QTC Calculation: 435 R Axis:   65  Text Interpretation: Normal sinus rhythm Normal ECG When compared with ECG of 26-Jul-2023 09:51, No significant change was found Confirmed by Mona Kent [phone]) on 05/25/2024 9:09:46 AM - personally reviewed  ASSESSMENT: CAD status post PCI to the mid LAD (06/2022) Hypertension Dyslipidemia, goal LDL less than 70 Mild left carotid artery stenosis (06/2022) History of alpha gal syndrome Numerous medication intolerances  PLAN: 1.   Nancy [name] has coronary artery disease and had PCI in  2024.  She remains at increased risk for events because of poorly controlled cholesterol.  Blood pressure may also be not adequately controlled.  I advised her to take home blood pressure readings.  We may consider switching her metoprolol  over to carvedilol .  She was previously on Bystolic  but she had some concerns about that.  She has been unable or unwilling to take a number of medications due to side effects for her lipids.  We discussed Leqvio in some detail today.  She did have some misconceptions about that.  I think it could be a good option for her but she wants to wait till the cardiac outcome trial data in 2026 before she decides.  Also, she had a recent echo which showed normal LVEF.  Follow-up with me in 1 year with a repeat lipid profile.  Vinie KYM Maxcy, MD, [facility], FNLA, FACP  [place]  [facility]  Medical Director of the [facility]  [facility] Diplomate of the American Board of Clinical Lipidology Attending Cardiologist  Direct Dial: [phone]  Fax: [phone]  Website:  [email or web address]   [name] 05/25/2024, 9:36 AM    [1]  Allergies Allergen Reactions   Alpha-Gal Anaphylaxis, Hives, Itching and Rash   Other Other (See Comments)    Red Meats, Milk, Shellfish   Macrobid  [Nitrofurantoin ] Other (See Comments)    Hypoglycemia   Pravastatin  Other  (See Comments)    10-20mg  - myalgias   Repatha  [Evolocumab ] Other (See Comments)    Back aches, headache, sore throat   Rosuvastatin  Other (See Comments)    Myalgias on 10mg  daily and 5mg  2x/week dosing   Shellfish Allergy Other (See Comments)    Alph gal    Simvastatin  Other (See Comments)    Myalgias on 20mg  daily dosing   Nexletol  [Bempedoic Acid ] Swelling    Eye lid swelling   Atorvastatin  Other (See Comments)    Myalgias on 20mg  daily dosing   Lisinopril  Other (See Comments)    Not allergy, intolerance: headaches   Milk Protein Other (See Comments)    Allergy testing   Nitroglycerin  Other (See Comments)    Not allergic, but needs premedication to use this as it contains bovine components (alpha gal allergy)   Statins Other (See Comments)    Myalgias    Zetia  [Ezetimibe ] Other (See Comments)    Myalgias  [2]  Current Outpatient Medications on File Prior to Visit  Medication Sig Dispense Refill   aspirin  (ASPIRIN  81) 81 MG chewable tablet      aspirin  EC 81 MG tablet Take 1 tablet (81 mg total) by mouth daily.     Cholecalciferol (VITAMIN D-3 PO) Take 1 tablet by mouth 3 (three) times a week.     citalopram  (CELEXA ) 20 MG tablet Take 1 tablet (20 mg total) by mouth daily. For anxiety. 90 tablet 2   clobetasol cream (TEMOVATE) 0.05 % Apply 1 application  topically daily.     colesevelam  (WELCHOL ) 625 MG tablet Take 3 tablets (1,875 mg total) by mouth 2 (two) times daily with a meal. for cholesterol. 540 tablet 0   Cyanocobalamin  (B-12) 1000 MCG CAPS Take 1 tablet by mouth 3 (three) times a week.     EPINEPHRINE  0.3 mg/0.3 mL IJ SOAJ injection INJECT 0.3 MG INTO THE MUSCLE AS NEEDED FOR ANAPHYLAXIS. 2 each 0   metoprolol  succinate (TOPROL  XL) 25 MG 24 hr tablet Take 0.5 tablets (12.5 mg total) by mouth daily. 45 tablet 3  nitroGLYCERIN  (NITROLINGUAL ) 0.4 MG/SPRAY spray Place 1 spray under the tongue every 5 (five) minutes as needed for chest pain. 12 g 2   No current  facility-administered medications on file prior to visit.

## 2024-05-25 NOTE — Patient Instructions (Signed)
 Medication Instructions:  NO CHANGES  *If you need a refill on your cardiac medications before your next appointment, please call your pharmacy*  Lab Work: FASTING lab work in 1 year -- before next visit  If you have labs (blood work) drawn today and your tests are completely normal, you will receive your results only by: MyChart Message (if you have MyChart) OR A paper copy in the mail If you have any lab test that is abnormal or we need to change your treatment, we will call you to review the results.  Follow-Up: At [facility], you and your health needs are our priority.  As part of our continuing mission to provide you with exceptional heart care, our providers are all part of one team.  This team includes your primary Cardiologist (physician) and Advanced Practice Providers or APPs (Physician Assistants and Nurse Practitioners) who all work together to provide you with the care you need, when you need it.  Your next appointment:   12 months with Dr. Mona  We recommend signing up for the patient portal called MyChart.  Sign up information is provided on this After Visit Summary.  MyChart is used to connect with patients for Virtual Visits (Telemedicine).  Patients are able to view lab/test results, encounter notes, upcoming appointments, etc.  Non-urgent messages can be sent to your provider as well.   To learn more about what you can do with MyChart, go to [email or web address].   Other Instructions  Check BP at home.

## 2024-07-04 ENCOUNTER — Encounter: Payer: Self-pay | Admitting: Pharmacist

## 2024-07-04 DIAGNOSIS — E782 Mixed hyperlipidemia: Secondary | ICD-10-CM

## 2024-07-05 MED ORDER — COLESEVELAM HCL 625 MG PO TABS: 1875.0000 mg | ORAL_TABLET | Freq: Two times a day (BID) | ORAL | 1 refills | Status: AC

## 2024-07-13 ENCOUNTER — Other Ambulatory Visit: Payer: Self-pay | Admitting: Physician Assistant

## 2025-01-10 ENCOUNTER — Ambulatory Visit
